# Patient Record
Sex: Female | Born: 1950 | Race: White | Hispanic: No | Marital: Married | Smoking: Former smoker
Health system: Southern US, Community
[De-identification: ages and names within clinical notes are randomized; demographics above are authoritative.]

## PROBLEM LIST (undated history)

## (undated) DIAGNOSIS — K449 Diaphragmatic hernia without obstruction or gangrene: Secondary | ICD-10-CM

## (undated) DIAGNOSIS — R208 Other disturbances of skin sensation: Secondary | ICD-10-CM

## (undated) DIAGNOSIS — F329 Major depressive disorder, single episode, unspecified: Secondary | ICD-10-CM

## (undated) DIAGNOSIS — K759 Inflammatory liver disease, unspecified: Secondary | ICD-10-CM

## (undated) DIAGNOSIS — F419 Anxiety disorder, unspecified: Secondary | ICD-10-CM

## (undated) DIAGNOSIS — S62102A Fracture of unspecified carpal bone, left wrist, initial encounter for closed fracture: Secondary | ICD-10-CM

## (undated) DIAGNOSIS — F32A Depression, unspecified: Secondary | ICD-10-CM

## (undated) DIAGNOSIS — H9319 Tinnitus, unspecified ear: Secondary | ICD-10-CM

## (undated) DIAGNOSIS — J189 Pneumonia, unspecified organism: Secondary | ICD-10-CM

## (undated) DIAGNOSIS — K219 Gastro-esophageal reflux disease without esophagitis: Secondary | ICD-10-CM

## (undated) DIAGNOSIS — IMO0001 Reserved for inherently not codable concepts without codable children: Secondary | ICD-10-CM

## (undated) DIAGNOSIS — S62102B Fracture of unspecified carpal bone, left wrist, initial encounter for open fracture: Secondary | ICD-10-CM

## (undated) DIAGNOSIS — Z8614 Personal history of Methicillin resistant Staphylococcus aureus infection: Secondary | ICD-10-CM

## (undated) DIAGNOSIS — M1990 Unspecified osteoarthritis, unspecified site: Secondary | ICD-10-CM

## (undated) DIAGNOSIS — S73004A Unspecified dislocation of right hip, initial encounter: Secondary | ICD-10-CM

## (undated) HISTORY — PX: KNEE SURGERY: SHX244

## (undated) HISTORY — PX: HIP SURGERY: SHX245

## (undated) HISTORY — PX: OTHER SURGICAL HISTORY: SHX169

## (undated) HISTORY — PX: JOINT REPLACEMENT: SHX530

## (undated) HISTORY — PX: BACK SURGERY: SHX140

---

## 1971-12-24 DIAGNOSIS — R208 Other disturbances of skin sensation: Secondary | ICD-10-CM

## 1971-12-24 HISTORY — DX: Other disturbances of skin sensation: R20.8

## 2006-02-24 ENCOUNTER — Inpatient Hospital Stay (HOSPITAL_COMMUNITY): Admission: RE | Admit: 2006-02-24 | Discharge: 2006-02-27 | Payer: Self-pay | Admitting: Orthopedic Surgery

## 2006-07-18 ENCOUNTER — Inpatient Hospital Stay (HOSPITAL_COMMUNITY): Admission: RE | Admit: 2006-07-18 | Discharge: 2006-07-20 | Payer: Self-pay | Admitting: Orthopedic Surgery

## 2008-01-06 ENCOUNTER — Inpatient Hospital Stay (HOSPITAL_COMMUNITY): Admission: RE | Admit: 2008-01-06 | Discharge: 2008-01-09 | Payer: Self-pay | Admitting: Orthopedic Surgery

## 2008-02-28 ENCOUNTER — Inpatient Hospital Stay (HOSPITAL_COMMUNITY): Admission: EM | Admit: 2008-02-28 | Discharge: 2008-03-03 | Payer: Self-pay | Admitting: Emergency Medicine

## 2008-06-17 ENCOUNTER — Ambulatory Visit (HOSPITAL_COMMUNITY): Admission: RE | Admit: 2008-06-17 | Discharge: 2008-06-18 | Payer: Self-pay | Admitting: Orthopedic Surgery

## 2008-09-22 ENCOUNTER — Inpatient Hospital Stay (HOSPITAL_COMMUNITY): Admission: RE | Admit: 2008-09-22 | Discharge: 2008-09-25 | Payer: Self-pay | Admitting: Orthopedic Surgery

## 2009-03-17 ENCOUNTER — Inpatient Hospital Stay (HOSPITAL_COMMUNITY): Admission: RE | Admit: 2009-03-17 | Discharge: 2009-03-20 | Payer: Self-pay | Admitting: Orthopedic Surgery

## 2009-03-29 ENCOUNTER — Emergency Department (HOSPITAL_COMMUNITY): Admission: EM | Admit: 2009-03-29 | Discharge: 2009-03-29 | Payer: Self-pay | Admitting: Emergency Medicine

## 2009-03-30 ENCOUNTER — Inpatient Hospital Stay (HOSPITAL_COMMUNITY): Admission: EM | Admit: 2009-03-30 | Discharge: 2009-04-07 | Payer: Self-pay | Admitting: Emergency Medicine

## 2009-05-06 ENCOUNTER — Emergency Department (HOSPITAL_COMMUNITY): Admission: EM | Admit: 2009-05-06 | Discharge: 2009-05-06 | Payer: Self-pay | Admitting: Emergency Medicine

## 2011-04-02 LAB — CBC
Hemoglobin: 12.1 g/dL (ref 12.0–15.0)
MCHC: 32.6 g/dL (ref 30.0–36.0)
MCV: 85.4 fL (ref 78.0–100.0)
RBC: 4.36 MIL/uL (ref 3.87–5.11)

## 2011-04-02 LAB — BASIC METABOLIC PANEL
BUN: 7 mg/dL (ref 6–23)
Calcium: 8.9 mg/dL (ref 8.4–10.5)
GFR calc non Af Amer: >60 mL/min (ref >60)
Glucose, Bld: 92 mg/dL (ref 70–99)

## 2011-04-02 LAB — URINALYSIS, ROUTINE W REFLEX MICROSCOPIC
Bilirubin Urine: NEGATIVE
Hgb urine dipstick: NEGATIVE
Specific Gravity, Urine: 1.012 (ref 1.005–1.030)
pH: 5.5 (ref 5.0–8.0)

## 2011-04-02 LAB — DIFFERENTIAL
Basophils Relative: 0 % (ref 0–1)
Eosinophils Absolute: 0.5 10*3/uL (ref 0.0–0.7)
Eosinophils Relative: 5 % (ref 0–5)
Monocytes Absolute: 0.6 10*3/uL (ref 0.1–1.0)
Monocytes Relative: 5 % (ref 3–12)

## 2011-04-03 LAB — BASIC METABOLIC PANEL
BUN: 7 mg/dL (ref 6–23)
BUN: 10 mg/dL (ref 6–23)
BUN: 9 mg/dL (ref 6–23)
CO2: 28 mEq/L (ref 19–32)
Calcium: 8 mg/dL — ABNORMAL LOW (ref 8.4–10.5)
Calcium: 8.3 mg/dL — ABNORMAL LOW (ref 8.4–10.5)
Calcium: 8.8 mg/dL (ref 8.4–10.5)
Chloride: 105 mEq/L (ref 96–112)
Chloride: 104 mEq/L (ref 96–112)
Chloride: 108 mEq/L (ref 96–112)
Chloride: 98 mEq/L (ref 96–112)
Creatinine, Ser: 0.52 mg/dL (ref 0.4–1.2)
Creatinine, Ser: 0.6 mg/dL (ref 0.4–1.2)
GFR calc Af Amer: >60 mL/min (ref >60)
GFR calc Af Amer: >60 mL/min (ref >60)
GFR calc Af Amer: >60 mL/min (ref >60)
GFR calc non Af Amer: >60 mL/min (ref >60)
GFR calc non Af Amer: >60 mL/min (ref >60)
GFR calc non Af Amer: >60 mL/min (ref >60)
GFR calc non Af Amer: >60 mL/min (ref >60)
GFR calc non Af Amer: >60 mL/min (ref >60)
Glucose, Bld: 101 mg/dL — ABNORMAL HIGH (ref 70–99)
Potassium: 3.9 mEq/L (ref 3.5–5.1)
Potassium: 4 mEq/L (ref 3.5–5.1)
Potassium: 4 mEq/L (ref 3.5–5.1)
Potassium: 4 mEq/L (ref 3.5–5.1)
Potassium: 4.1 mEq/L (ref 3.5–5.1)
Sodium: 137 mEq/L (ref 135–145)
Sodium: 137 mEq/L (ref 135–145)
Sodium: 139 mEq/L (ref 135–145)
Sodium: 139 mEq/L (ref 135–145)

## 2011-04-03 LAB — CBC
HCT: 29 % — ABNORMAL LOW (ref 36.0–46.0)
HCT: 32.9 % — ABNORMAL LOW (ref 36.0–46.0)
HCT: 33.2 % — ABNORMAL LOW (ref 36.0–46.0)
HCT: 34.8 % — ABNORMAL LOW (ref 36.0–46.0)
HCT: 36 % (ref 36.0–46.0)
Hemoglobin: 10.8 g/dL — ABNORMAL LOW (ref 12.0–15.0)
Hemoglobin: 11.1 g/dL — ABNORMAL LOW (ref 12.0–15.0)
Hemoglobin: 11.3 g/dL — ABNORMAL LOW (ref 12.0–15.0)
Hemoglobin: 9.8 g/dL — ABNORMAL LOW (ref 12.0–15.0)
MCHC: 33.1 g/dL (ref 30.0–36.0)
MCHC: 33.8 g/dL (ref 30.0–36.0)
MCV: 87.4 fL (ref 78.0–100.0)
MCV: 87 fL (ref 78.0–100.0)
MCV: 87.9 fL (ref 78.0–100.0)
MCV: 88.7 fL (ref 78.0–100.0)
MCV: 88.9 fL (ref 78.0–100.0)
Platelets: 482 10*3/uL — ABNORMAL HIGH (ref 150–400)
Platelets: 438 10*3/uL — ABNORMAL HIGH (ref 150–400)
Platelets: 471 10*3/uL — ABNORMAL HIGH (ref 150–400)
Platelets: 504 10*3/uL — ABNORMAL HIGH (ref 150–400)
RBC: 3.69 MIL/uL — ABNORMAL LOW (ref 3.87–5.11)
RBC: 3.74 MIL/uL — ABNORMAL LOW (ref 3.87–5.11)
RBC: 4.59 MIL/uL (ref 3.87–5.11)
RDW: 15 % (ref 11.5–15.5)
RDW: 15.3 % (ref 11.5–15.5)
RDW: 15.6 % — ABNORMAL HIGH (ref 11.5–15.5)
RDW: 15.6 % — ABNORMAL HIGH (ref 11.5–15.5)
WBC: 9.1 10*3/uL (ref 4.0–10.5)
WBC: 14 10*3/uL — ABNORMAL HIGH (ref 4.0–10.5)
WBC: 24.8 10*3/uL — ABNORMAL HIGH (ref 4.0–10.5)
WBC: 31.4 10*3/uL — ABNORMAL HIGH (ref 4.0–10.5)

## 2011-04-03 LAB — ANAEROBIC CULTURE

## 2011-04-03 LAB — WOUND CULTURE

## 2011-04-03 LAB — DIFFERENTIAL
Basophils Absolute: 0 10*3/uL (ref 0.0–0.1)
Eosinophils Relative: 0 % (ref 0–5)
Eosinophils Relative: 5 % (ref 0–5)
Lymphocytes Relative: 12 % (ref 12–46)
Lymphocytes Relative: 2 % — ABNORMAL LOW (ref 12–46)
Lymphs Abs: 1.7 10*3/uL (ref 0.7–4.0)
Monocytes Relative: 1 % — ABNORMAL LOW (ref 3–12)
Neutro Abs: 10.6 10*3/uL — ABNORMAL HIGH (ref 1.7–7.7)
Neutrophils Relative %: 76 % (ref 43–77)
Neutrophils Relative %: 97 % — ABNORMAL HIGH (ref 43–77)

## 2011-04-03 LAB — PROTIME-INR
INR: 1.1 (ref 0.00–1.49)
INR: 1.2 (ref 0.00–1.49)
INR: 1.5 (ref 0.00–1.49)
INR: 1.9 — ABNORMAL HIGH (ref 0.00–1.49)
INR: 2.1 — ABNORMAL HIGH (ref 0.00–1.49)
Prothrombin Time: 15.3 seconds — ABNORMAL HIGH (ref 11.6–15.2)
Prothrombin Time: 18.3 seconds — ABNORMAL HIGH (ref 11.6–15.2)
Prothrombin Time: 22.7 seconds — ABNORMAL HIGH (ref 11.6–15.2)

## 2011-04-03 LAB — GRAM STAIN

## 2011-04-04 LAB — TYPE AND SCREEN: Donor AG Type: NEGATIVE

## 2011-04-04 LAB — URINALYSIS, ROUTINE W REFLEX MICROSCOPIC
Glucose, UA: NEGATIVE mg/dL
Hgb urine dipstick: NEGATIVE
Protein, ur: NEGATIVE mg/dL
Specific Gravity, Urine: 1.015 (ref 1.005–1.030)

## 2011-04-04 LAB — COMPREHENSIVE METABOLIC PANEL
BUN: 14 mg/dL (ref 6–23)
CO2: 26 mEq/L (ref 19–32)
Chloride: 105 mEq/L (ref 96–112)
Creatinine, Ser: 0.59 mg/dL (ref 0.4–1.2)
GFR calc non Af Amer: >60 mL/min (ref >60)
Glucose, Bld: 92 mg/dL (ref 70–99)
Total Bilirubin: 0.6 mg/dL (ref 0.3–1.2)

## 2011-04-04 LAB — BASIC METABOLIC PANEL
BUN: 8 mg/dL (ref 6–23)
BUN: 9 mg/dL (ref 6–23)
CO2: 30 mEq/L (ref 19–32)
Calcium: 8.7 mg/dL (ref 8.4–10.5)
Calcium: 8.8 mg/dL (ref 8.4–10.5)
Chloride: 101 mEq/L (ref 96–112)
Chloride: 104 mEq/L (ref 96–112)
Creatinine, Ser: 0.46 mg/dL (ref 0.4–1.2)
GFR calc Af Amer: >60 mL/min (ref >60)
GFR calc Af Amer: >60 mL/min (ref >60)
Glucose, Bld: 93 mg/dL (ref 70–99)
Potassium: 3.8 mEq/L (ref 3.5–5.1)
Potassium: 4.4 mEq/L (ref 3.5–5.1)
Sodium: 134 mEq/L — ABNORMAL LOW (ref 135–145)
Sodium: 137 mEq/L (ref 135–145)

## 2011-04-04 LAB — CBC
HCT: 34.4 % — ABNORMAL LOW (ref 36.0–46.0)
HCT: 38.2 % (ref 36.0–46.0)
HCT: 42.3 % (ref 36.0–46.0)
Hemoglobin: 11.2 g/dL — ABNORMAL LOW (ref 12.0–15.0)
Hemoglobin: 12.4 g/dL (ref 12.0–15.0)
Hemoglobin: 13.9 g/dL (ref 12.0–15.0)
MCHC: 32.5 g/dL (ref 30.0–36.0)
MCV: 88.6 fL (ref 78.0–100.0)
MCV: 89.8 fL (ref 78.0–100.0)
MCV: 90 fL (ref 78.0–100.0)
Platelets: 287 10*3/uL (ref 150–400)
Platelets: 337 10*3/uL (ref 150–400)
RBC: 4.25 MIL/uL (ref 3.87–5.11)
RBC: 4.47 MIL/uL (ref 3.87–5.11)
RBC: 4.77 MIL/uL (ref 3.87–5.11)
RDW: 16.4 % — ABNORMAL HIGH (ref 11.5–15.5)
WBC: 10.6 10*3/uL — ABNORMAL HIGH (ref 4.0–10.5)
WBC: 8.6 10*3/uL (ref 4.0–10.5)
WBC: 9.1 10*3/uL (ref 4.0–10.5)

## 2011-04-04 LAB — DIFFERENTIAL
Basophils Absolute: 0 10*3/uL (ref 0.0–0.1)
Basophils Relative: 0 % (ref 0–1)
Eosinophils Absolute: 1 10*3/uL — ABNORMAL HIGH (ref 0.0–0.7)
Eosinophils Relative: 10 % — ABNORMAL HIGH (ref 0–5)
Monocytes Absolute: 0.6 10*3/uL (ref 0.1–1.0)
Monocytes Relative: 6 % (ref 3–12)

## 2011-04-04 LAB — APTT: aPTT: 33 seconds (ref 24–37)

## 2011-04-04 LAB — BRAIN NATRIURETIC PEPTIDE: Pro B Natriuretic peptide (BNP): 101 pg/mL — ABNORMAL HIGH (ref 0.0–100.0)

## 2011-04-04 LAB — PROTIME-INR
INR: 1 (ref 0.00–1.49)
INR: 1.1 (ref 0.00–1.49)
Prothrombin Time: 14.8 seconds (ref 11.6–15.2)

## 2011-05-07 NOTE — H&P (Signed)
[name], [name]                 ACCOUNT NO.:  [id number]   MEDICAL RECORD NO.:  [id number]          PATIENT TYPE:  INP   LOCATION:  1538                         FACILITY:  [facility]   PHYSICIAN:  Alvy Beal, MD    DATE OF BIRTH:  03/01/1951   DATE OF ADMISSION:  02/28/2008  DATE OF DISCHARGE:                              HISTORY & PHYSICAL   ADMISSION DIAGNOSIS:  Right periprosthetic femur fracture.   HISTORY:  This is a very pleasant 60 year old woman who presents with a  chief complaint of left thigh pain.  The patient states that last  evening at about 11 o'clock she slipped and twisted on a wet floor.  She  fell and then noted immediate pain on the right thigh.  She has had a  previous total hip by my partner Dr. Lequita Halt and when she presented x-  rays were done and they demonstrate a spiral distal femur fracture  (periprosthetic) as a result an ortho consultation was requested.   Past medical, surgical, family, and social history includes chronic back  pain status post multilevel lumbar spinal anterior-posterior fusion,  carpal tunnel release, hip replacement.  She is a nondrinker, non-drug  abuser.  She does smoke.   DRUG ALLERGIES:  AUGMENTIN.   CURRENT MEDICATIONS:  Percocet, Neurontin, Prozac, Xanax, clonazepam,  Prilosec and aspirin.   PHYSICAL EXAMINATION:  She is a pleasant woman who appears her stated  age in no acute distress. She is alert, oriented x3.  She has no  shortness of breath or chest pain.  Her abdomen is soft and nontender.  She has significant right thigh pain, moderate degree of swelling but it  is not tense to palpation.  The calf is soft and nontender.  She has  full range of motion at the ankle. Distal neurological exam is intact to  motor and sensory testing and intact dorsalis pedis posterior tibialis  pulses.  The left lower extremity is entirely asymptomatic.   X-rays demonstrate a spiral fracture of the distal femur starting at the  end  of the prostatic prosthesis and terminating just above the medial  femoral condyle.   At this point in time I have spoken with Dr. Lequita Halt and I have updated  him as to the situation.  The patient will be admitted, will keep her  n.p.o., place her in 10 pounds of Buck's traction and give her PCA for  pain control.  The plan will be to make her n.p.o. after midnight  tonight so that Dr. Lequita Halt can determine the best course of action for  surgical fixation.  I have explained this to the patient.      Alvy Beal, MD  Electronically Signed     DDB/MEDQ  D:  02/28/2008  T:  02/29/2008  Job:  [id number]

## 2011-05-07 NOTE — Discharge Summary (Signed)
[name], ESTOCK                 ACCOUNT NO.:  [id number]   MEDICAL RECORD NO.:  [id number]          PATIENT TYPE:  INP   LOCATION:  1611                         FACILITY:  [facility]   PHYSICIAN:  Ollen Gross, M.D.    DATE OF BIRTH:  Dec 07, 1951   DATE OF ADMISSION:  03/30/2009  DATE OF DISCHARGE:  04/07/2009                               DISCHARGE SUMMARY   ADMITTING DIAGNOSES:  1. Right hip infection.  2. Anxiety.  3. History of pneumonia with recent hospital stay.  4. Hiatal hernia.  5. Diverticulosis.  6. History of chronic hepatitis.  7. Degenerative disk disease.  8. Osteopenia.  9. Past history of acute respiratory distress syndrome following 13-      hour back surgery.  10.Postmenopausal.   DISCHARGE DIAGNOSES:  1. Infected hematoma right hip status post irrigation and debridement,      right hip.  2. Status post repeat irrigation and debridement, right hip.  3. Positive methicillin-resistant Staphylococcus aureus culture.  4. Postoperative acute blood loss anemia.  5. Mild hyponatremia preoperatively.  6. Mild hypokalemia preoperatively.  7. Anxiety.  8. History of pneumonia with recent hospital stay.  9. Hiatal hernia.  10.Diverticulosis.  11.History of chronic hepatitis.  12.Degenerative disk disease.  13.Osteopenia.  14.Past history of acute respiratory distress syndrome following 13-      hour back surgery.  15.Postmenopausal.   PROCEDURE:  1. The patient was taken to the OR on April 01, 2009 and underwent a      right hip irrigation and debridement.  Surgeon Dr. Lequita Halt, no      assistant, anesthesia general.  2. The patient was taken back to the OR on April 05, 2009 and      underwent a repeat right hip irrigation and debridement.  Surgeon      Dr. Lequita Halt, assistant Avel Peace, PA-C, anesthesia general.   CONSULTATIONS:  None.   BRIEF HISTORY:  The patient is a 60 year old female with a long complex  history in regards to her right hip, recent  acetabular liner 2 weeks ago  over to a constrained liner, postop course notable for probable  pneumonia.  She was discharged home on day 3 and did fine.  Two days  later she developed fever and increased right hip pain.  She was  admitted for right hip infection which following aspiration of the hip  showed purulent fluid.  Her INR was elevated and allowed to come down  before her procedure.   LABORATORY DATA:  Preop CBC showed a hemoglobin of 13.4, hematocrit of  40.0, white count was elevated 31.4, platelets 475.  Second CBC showed a  white count trending down to 24.8 and 14, got down to normal level of  8.4, last noted white count was 9.1.  CBCs were followed.  Hemoglobin  dropped down to 11.3 with some hydration and after the first surgery  hemoglobin was 10.8, went back up to 11.6, dropped down a little further  after the second surgery down to 9.8.  Last H and H was 10.4 and 30.8.  The differential on the  admission CBC showed elevated neutrophils of 97,  low lymphs of 2, monos 1, eosinophils of 0, basophils 0.  The follow-up  CBC with diff on April 10 410 showed neutrophils normalized at 76.  PT/INR on admission 22.7 and 1.9, allowed to drift down.  She was 1.2 on  April 01, 2009 before the second procedure.  Last noted PT/INR in the  hospital 14.8 and 1.1.  BMET on admission showed a low sodium of 130,  low potassium at 3.3.  Remaining BMET within normal limits.  Serial  BMETs were followed.  Sodium came up to 138, potassium came up to 4.09.  Remaining electrolytes remained within normal limits.  Vancomycin  troughs taken every other day on April 02, 2009, April 04, 2009 and  April 06, 2009, their numbers were respectively 5.6, 13.7, and 19.2.  Stat Gram stain taken from the hip aspiration smear showed few WBCs, no  organisms seen.  Wound culture taken from the hip aspiration smear  showed no organisms, but the culture grew out few methicillin-resistant  staph aureus.  Body fluid  culture taken on April 04, 2009 smear showed  no organisms, no growth.  Wound culture taken on April 01, 2009 smear  showed no organisms, no growth.  Anaerobic cultures taken on April 01, 2009 smear showed no organisms, no squamous epithelials, no anaerobes  isolated.   HOSPITAL COURSE:  The patient was admitted to [facility] ,  placed at bed rest with bathroom privileges, started on IV vancomycin.  She had elevated INR and the Coumadin was stopped and she was allowed to  drift back down.  Once her INR was normalized, she was taken to the  operating room on April 01, 2009, two days later, and underwent the  above-stated procedure without complication.  She had a noted white  count of over 31 on admission.  By hospital day #2 it was down to 24,  and the white count continued to improve.  She was given a little bit of  vitamin K to help her correction of the anticoagulation.  She was taken  to the OR on April 01, 2009 and underwent the above procedure.  Cultures  were repeated and continued on the IV vancomycin.  A PICC line was  ordered which was placed during the hospital course.  By April 02, 2009  she was postop day #1 and her white count was back down to near normal  at 10.8.  By postop day #2 she was doing a little bit better, but we had  determined that the original culture taken from the aspiration grew out  MRSA.  She was placed on contact precautions.  PICC line was placed and  she continued with the antibiotics.  By April 04, 2009 she was doing  better, however, due to the significant infection of MRSA it was felt  that she would benefit by undergoing a repeat washout.  She had a small  place that came up on the left hip and underwent a bedside procedure on  April 04, 2009 utilizing sterile technique and local anesthetic.  She  underwent an aspiration of fluid from the left hip that was sent for  Gram stain and culture.  Those were the cultures that were documented   from April 04, 2009.  That smear showed no organisms and no growth from  that opposite hip on the left side, but still had the MRSA infection on  the right hip.  She was  preop'd and taken back to the operating room on  the following day of April 05, 2009 and underwent repeat I and D and  washout due to the significant infection.  The following day, April 06, 2009, she continued to receive the IV vancomycin.  The Hemovac drain  came out a little bit later on that day after getting up with therapy.  The white count was back down to 10.  It was felt she would be going  home on the next day or so, so we got discharge planning to help arrange  home IV antibiotics via the PICC line on April 07, 2009.  She was doing  well, white count was normal at 9.1.  She was clinically doing better.  Hemoglobin was 10.4.  Lytes looked good.  Dressing changed, incision  looked good from the previous I and D.  She was receiving vancomycin and  discharged home.   DISCHARGE PLAN:  1. Patient discharged home on April 07, 2009.  2. Discharge diagnoses, please see above.  3. Discharge medications:  Vancomycin via PICC line for 4 weeks,      Robaxin, Percocet, Lovenox.  4. She is weightbearing as tolerated with activity, hip precautions.   FOLLOWUP:  She is to follow up Friday on April 14, 2009, call for an  appointment.   DISPOSITION:  Home.   CONDITION ON DISCHARGE:  Improved.      Alexzandrew L. Perkins, P.A.C.      Ollen Gross, M.D.  Electronically Signed    ALP/MEDQ  D:  04/21/2009  T:  04/21/2009  Job:  [id number]

## 2011-05-07 NOTE — Op Note (Signed)
NAMEGRISELDA, BRAMBLETT                 ACCOUNT NO.:  [id number]   MEDICAL RECORD NO.:  [id number]          PATIENT TYPE:  INP   LOCATION:  1611                         FACILITY:  [facility]   PHYSICIAN:  Ollen Gross, M.D.    DATE OF BIRTH:  1951-02-01   DATE OF PROCEDURE:  04/05/2009  DATE OF DISCHARGE:                               OPERATIVE REPORT   PREOPERATIVE DIAGNOSIS:  Infected right total hip.   POSTOPERATIVE DIAGNOSIS:  Infected right total hip.   PROCEDURE:  Right hip irrigation and debridement.   SURGEON:  Dr. Lequita Halt.   ASSISTANT:  Avel Peace, PA-C.   ANESTHESIA:  General.   ESTIMATED BLOOD LOSS:  Minimal.   DRAIN:  Hemovac x1.   COMPLICATIONS:  None.   CONDITION:  Stable to recovery.   BRIEF CLINICAL NOTE:  This is a 60 year old female with a complex  history in regards to right hip.  She developed an infected hematoma  approximately 2 weeks postop from revision hip.  She was taken to the  operating room 4 days ago and had irrigation and debridement.  Culture  showed MRSA.  She is here today for a second irrigation and debridement.   PROCEDURE IN DETAIL:  After the successful administration of general  anesthetic, the patient was placed in the left lateral decubitus  position with the right side up and held with the hip positioner.  The  Hemovac drain was easily removed and then the right lower extremity  isolated from her perineum with plastic drapes and prepped and draped in  the usual sterile fashion.  Staples were removed prior to prepping and  draping.  Once prepping and draping was complete, the previous incisions  were reutilized.  The skin cut with a 10 blade through subcutaneous  tissue to the level of the fascia lata which was incised in line with  the skin incision.  There was minimal fluid in the joint.  The joint  actually looks great.  The tissue all looks very clean.  I did not see  any purulence or signs of necrotic tissue.  The wound was then  thoroughly irrigated with 6 liters of saline solution using pulsatile  lavage.  At the completion of this, the tissues remained healthy  appearing.  I placed a Hemovac drain deep to the fascia lata, then  closed the fascia lata with interrupted #1 Vicryl.  The second limb of  the Hemovac drain was placed in the subcu tissue and the subcu  closed with interrupted 2-0 Vicryl.  Both drains were tested and were  freely moving.  There were hooked up to suction.  Staples were placed  and the incision cleaned and dried.  A bulky sterile dressing applied.  She was then awakened and transported to recovery in stable condition.      Ollen Gross, M.D.  Electronically Signed     FA/MEDQ  D:  04/05/2009  T:  04/05/2009  Job:  [id number]

## 2011-05-07 NOTE — Op Note (Signed)
NAMEALONI, CHUANG                 ACCOUNT NO.:  [id number]   MEDICAL RECORD NO.:  [id number]          PATIENT TYPE:  INP   LOCATION:  1538                         FACILITY:  [facility]   PHYSICIAN:  Ollen Gross, M.D.    DATE OF BIRTH:  11-Jul-1951   DATE OF PROCEDURE:  02/29/2008  DATE OF DISCHARGE:                               OPERATIVE REPORT   PREOPERATIVE DIAGNOSIS:  Right periprosthetic femur fracture.   POSTOPERATIVE DIAGNOSIS:  Right periprosthetic femur fracture.   PROCEDURE:  Open reduction internal fixation right periprosthetic femur  fracture with femoral component revision.   SURGEON:  Dr. Homero Fellers Aluisio.   ASSISTANT:  Avel Peace, PA-C.   ANESTHESIA:  General.   ESTIMATED BLOOD LOSS:  800.   DRAINS:  Hemovac x1.   COMPLICATIONS:  None.   CONDITION:  Stable to recovery.   CLINICAL NOTE:  Bryndle is a 60 year old female who had an uncomplicated  right total hip arthroplasty done approximately 2 months ago. She was  doing extremely well and went to the beach this past weekend.  She was  in her laundry room and slipped and fell, twisting her leg with  immediate pain and deformity.  Radiographs showed a type 3  periprosthetic femur fracture.  She presents now for open reduction  internal fixation and femoral component revision to a longer stem.   PROCEDURE IN DETAIL:  After the successful initiation of general  anesthetic, the patient is placed in the left lateral decubitus position  with the right side up and held with a hip positioner.  The right lower  extremity is isolated from her perineum with plastic drapes and prepped  and draped in the usual sterile fashion.  A long posterolateral incision  is made starting just above the greater trochanter coursing all the way  down to the metaphyseal flare of the distal femur.  The skin is cut with  a 10 blade through the subcutaneous to the tissue to the fascia lata  which is incised in line with the skin incision.   Meticulous hemostasis  is achieved throughout the dissection.  The fascia of the vastus  lateralis is incised and muscle is elevated to reveal the femur.  It was  a spiral fracture running at least half the length of the femur.  We  applied traction to the leg and anatomically reduced it and held it with  fracture reduction clamps.  I placed two Zimmer cables around the femur  to hold it in place while I applied the plate.  The cables were placed  and tightened.  This kept the fracture reduced. A six-hole Zimmer cable  plate is then placed on the lateral cortex of the femur and clamped to  the femur.  The two distal holes were filled with cortical screws 46 and  48 mm in length with excellent purchase.  I did not fully tightened them  until I placed two cables proximally and once I fully tightened the  cables then I fully tightened the screws.  We had excellent reduction of  the fracture.  Two more cables are  placed for a total of 6 cables, two  around the bone itself, a fourth through the plate. I feel very secure  with the fixation over this fracture. We then returned proximally and  isolated the pseudocapsule posteriorly off the femur.  This revealed the  hip joint.  I carefully dislocated the hip and removed the femoral head.  She had an S-ROM 20 x 15 stem with a 36 plus 8 neck.  We used the  extraction tool to dislodge the bond between the stem and the sleeve.  I  then extracted the stem easily.  Per measurement intraop and throughout  templating, the appropriate length for the revision stem is a 20 x 15,  36 plus 8 extra long which is 270 mm in length. This would get [place] just  to the level of the proximal screw that we placed through the plate.  We  then placed the guidewire through the femoral sleeve. The sleeve was  well fixed. The guidewire was passed into the femoral canal and then  reamed up to 16.5 mm which is 1.5 mm above the internal diameter of the  stem.  The 20 x 15, 36  plus 8 right extra long stem is then impacted  into the femur, matching native anteversion.  Once it is fully impacted  then we placed the femoral head which was the 36 plus 0 and then the hip  is reduced with excellent stability throughout.  I then went back and  inspected the fracture site distally and it did not shift at all.  I  palpated around the femur and visualized around the femur and the stem  remained intramedullary not coming through the femur.  I placed it  through a range of motion and even with rotation the construct is very  stable.  The wound is then copiously irrigated with saline solution and  the fascia of the vastus lateralis closed with running #1 Vicryl.  The  fascia lata was closed with #1 Vicryl over a Hemovac drain, subcu closed  with #1 and #2-0 Vicryl and skin with staples.  Prior to closing  proximally, I did reattach the pseudocapsule posteriorly to the femur  with Ethibond suture.  Once the skin is closed and we hooked up the  drain and the incisions is cleaned and dried and bulky sterile dressing  applied. She is placed into a knee immobilizer, awakened and transported  to recovery in stable condition.      Ollen Gross, M.D.  Electronically Signed     FA/MEDQ  D:  02/29/2008  T:  03/01/2008  Job:  [id number]

## 2011-05-07 NOTE — Op Note (Signed)
NAMESHIGEKO, MANARD                 ACCOUNT NO.:  [id number]   MEDICAL RECORD NO.:  [id number]          PATIENT TYPE:  AMB   LOCATION:  DAY                          FACILITY:  [facility]   PHYSICIAN:  Ollen Gross, M.D.    DATE OF BIRTH:  04/20/1951   DATE OF PROCEDURE:  06/17/2008  DATE OF DISCHARGE:                               OPERATIVE REPORT   PREOPERATIVE DIAGNOSIS:  Delayed union right femur fracture.   POSTOPERATIVE DIAGNOSIS:  Delayed union right femur fracture.   PROCEDURE:  Hardware revision right femur fracture.   SURGEON:  Shella Spearing, M.D.   ASSISTANT:  Patrica Duel, PA-C   ANESTHESIA:  General.   ESTIMATED BLOOD LOSS:  Minimal.   DRAIN:  None.   COMPLICATIONS:  None.   CONDITION:  Stable to recovery.   BRIEF CLINICAL NOTE:  Caoilainn is a 60 year old female with complex history  in regards to her right hip.  She had an uncomplicated primary total hip  arthroplasty earlier this year, and several weeks postoperatively had a  fall and fractured well below the stem.  She had a revision to a long  stem with a side plate with cables and screws.  Unfortunately, she has  had delayed healing and is now starting to bow in a varus direction, and  the screws are separating from the plate.  She presents now for hardware  revision with hopeful maintenance of the plate and addition of new  screws and cables to stabilize this fracture.   PROCEDURE IN DETAIL:  After successful administration of general  anesthetic, the patient is placed in the left lateral decubitus position  with the right side up and held with the hip positioner.  Right lower  extremity was isolated from her perineum with plastic drapes and prepped  and draped in usual sterile fashion.  I used the distal one-third of her  previous incision.  The skin was cut with a #10 blade through  subcutaneous tissue to the fascia lata which was incised in line with  the skin incision.  Fascia of the vastus  lateralis is also elevated.  I  dissected through scar to get down to the plate.  The distal-most screw  is backed away out of the plate and removed.  No evidence of any  abnormal-appearing fluid around the area.  I was unable to reduce the  distal femur back to the plate.  I removed that distal-most screw and  then the other screw right more proximal to it.  The more proximal had  been broken.  I redirected the angle and had more proximal screw  drilling through both cortices and then placed a 60 mm cortical screw  with outstanding purchase, which kept the fracture reduced.  Then placed  a 6.5 cancellous screw through the distal-most hole after drilling and  also had excellent purchase with that.  I then placed two additional  cables through the Zimmer plate, around the femur, and then tightened  and crimped he cables.  We cut the cables, and I placed her knee through  a range of motion  and hip through a range of motion, and it was found  that the construct was stable and that there was no movement at the  fracture site.  There was already abundant callus that had formed.  Thus, I did not place any additional bone graft.  I feel with proper  stability this fracture will now heal.  The wound was then copiously  irrigated with saline solution, and deep tissue closed with running #2  quill, subcutaneous closed with interrupted 2-0 Vicryl, and skin with  staples.  Incision was cleaned and dried, and a bulky sterile dressing  applied.  She is placed into a knee immobilizer, awakened, and  transported to recovery in stable condition.      Ollen Gross, M.D.  Electronically Signed     FA/MEDQ  D:  06/17/2008  T:  06/17/2008  Job:  [id number]

## 2011-05-07 NOTE — Op Note (Signed)
[name], [name]                 ACCOUNT NO.:  [id number]   MEDICAL RECORD NO.:  [id number]          PATIENT TYPE:  INP   LOCATION:  NA                           FACILITY:  [facility]   PHYSICIAN:  Ollen Gross, M.D.    DATE OF BIRTH:  [birth year]   DATE OF PROCEDURE:  01/06/2008  DATE OF DISCHARGE:                               OPERATIVE REPORT   PREOPERATIVE DIAGNOSIS:  Avascular necrosis, right hip.   POSTOPERATIVE DIAGNOSIS:  Avascular necrosis, right hip.   PROCEDURE:  Right total hip arthroplasty.   SURGEON:  Ollen Gross, M.D.   ASSISTANT:  Avel Peace PA-C   ANESTHESIA:  General.   BLOOD LOSS:  150 mL.   DRAINS:  None.   COMPLICATIONS:  None.   CONDITION:  Stable to recovery.   BRIEF CLINICAL NOTE:  [name] is a 60 year old female with osteonecrosis  of the right hip with a rapid progression and collapse of the femoral  head and severe symptoms.  She has been essentially bedridden for the  past week.  She presents now for right total hip arthroplasty.   PROCEDURE IN DETAIL:  After the successful administration of general  anesthetic, the patient was placed in left lateral decubitus position  with the right side up and held with the hip positioner.  The right  lower extremity isolated from her perineum with plastic drapes and  prepped and draped in the usual sterile fashion.  Short posterolateral  incision was made with a 10 blade through subcutaneous tissue to the  level of the fascia lata which was incised in line with the skin  incision.  The sciatic nerve was palpated and protected and the short  rotators isolated off the femur.  Capsulectomy is performed.  The  femoral head is significantly eroded.  There were no signs of any  infectious type fluid.  The hip was dislocated and the center of the  remaining head marked.  Trial prosthesis placed such that the center of  the trial head corresponds to the center of the native femoral head.  Osteotomy lines marked  on the femoral neck and osteotomy made with an  oscillating saw.  The head is removed and the femur retracted anteriorly  to gain acetabular exposure.   Acetabular retractors were placed and labrum and osteophytes removed.  There was a lot of inflammatory tissue which is also excised.  The bone  quality the acetabulum looks healthy.  I began reaming at 43 mm coursing  in increments of 2 up to 49 mm and then a 50 mm pinnacle acetabular  shell was placed in anatomic position and transfixed with two dome  screws.  A permanent 36 mm neutral Ultamet metal liner was placed.   The femur was prepared with the canal finder and irrigation.  Axial  reaming is performed to 15.5 mm, proximal reaming to a 20D and the  sleeve machined to a large.  20 D large trial sleeve is placed with 20 x  15 stem and 36 standard neck matching native anteversion.  The trial  36.0 head is placed.  I reduced it and there was not enough offset.  I  went to 36 +8 neck matching native anteversion with a 36 +0 head.  The  offset was perfect.  The hip was reduced with great stability.  She had  full extension, full external rotation 70 degrees flexion, 40 degrees  adduction, 90 degrees internal rotation and 90 degrees of flexion and 70  degrees of internal rotation.  By placing the right leg on top of the  left leg lengths are equal.  The hip was then dislocated and trials  removed.  The permanent 20 D large sleeve is placed with 20 x 15 stem  and 36 +8 neck matching native anteversion.  The 36 +0 head is placed  and the hip was reduced same stability parameters.  The wounds copiously  irrigated with saline solution and short rotators reattached to the  femur through drill holes.  Fascia lata was closed with interrupted 1-0  Vicryl, subcu closed with #1-0 and #2-0 Vicryl and subcuticular running  4-0 Monocryl.  The incision is cleaned and dried and Steri-Strips and a  bulky sterile dressing applied.  She is placed into a  knee immobilizer,  awakened and transferred to recovery in stable condition.      Ollen Gross, M.D.  Electronically Signed     FA/MEDQ  D:  01/06/2008  T:  01/07/2008  Job:  [id number]

## 2011-05-07 NOTE — Op Note (Signed)
[name], [name]                 ACCOUNT NO.:  [id number]   MEDICAL RECORD NO.:  [id number]          PATIENT TYPE:  INP   LOCATION:  1611                         FACILITY:  [facility]   PHYSICIAN:  Ollen Gross, M.D.    DATE OF BIRTH:  1951/07/04   DATE OF PROCEDURE:  04/01/2009  DATE OF DISCHARGE:                               OPERATIVE REPORT   PREOPERATIVE DIAGNOSIS:  Infected hematoma right hip.   POSTOPERATIVE DIAGNOSIS:  Infected hematoma right hip.   PROCEDURE:  Right hip irrigation and debridement.   SURGEON:  Ollen Gross, M.D.   ASSISTANT:  No assistant.   ANESTHESIA:  General.   ESTIMATED BLOOD LOSS:  Minimal.   DRAINS:  Hemovac times one.   COMPLICATIONS:  None.   CONDITION:  Stable to recovery room.   BRIEF CLINICAL NOTE:  [name] is a 60 year old female with a long complex  history in regards to her right hip.  She had a revision acetabular  liner 2 weeks ago to a constrained liner.  Postop course notable for a  probable pneumonia.  She was discharged home on postop day #3 and did  fine.  Two days ago she developed fever and increased right hip pain.  She was admitted on April 8 and had a hip aspiration showing purulent  fluid.  Her INR was too elevated for surgery at that point and she is  brought to the operating room today for irrigation and debridement.   PROCEDURE IN DETAIL:  After the successful administration of general  anesthetic the patient was placed in left lateral decubitus position  with the right side up and held with the hip positioner.  Right lower  extremity was isolated from the perineum with plastic drapes and prepped  and draped in the usual sterile fashion.  Previous posterolateral  incisions were utilized and skin cut with 10 blade through subcutaneous  tissue.  There was a fair amount of purulent fluid in the subcu tissue.  It was sent for Gram stain, C and S, aerobic and anaerobic cultures.  I  thoroughly suctioned out this fluid and  then noted there was a  communication with the deep tissues.  I incised the fascia lata removing  all the previous sutures.  The tissue quality looked fine with no  evidence of any necrotic tissue.  We thoroughly irrigated with 3 liters  of saline.  I then debrided the superficial layers of the tissue down to  healthy bleeding tissue.  I irrigated with another 3 liters using  pulsatile lavage.  We then closed the fascia lata over a Hemovac drain  with  interrupted #1 PDS.  Subcu tissues were debrided down to healthy  appearing tissue.  The subcu was then closed with interrupted 2-0 Vicryl  and skin with staples.  Bulky sterile dressing was applied and drains  hooked to suction.  She was then awakened and transported to recovery in  stable condition.      Ollen Gross, M.D.  Electronically Signed     FA/MEDQ  D:  04/01/2009  T:  04/01/2009  Job:  [id number] 

## 2011-05-07 NOTE — H&P (Signed)
[name], KINDIG                 ACCOUNT NO.:  [id number]   MEDICAL RECORD NO.:  [id number]          PATIENT TYPE:  INP   LOCATION:  NA                           FACILITY:  [facility]   PHYSICIAN:  Ollen Gross, M.D.    DATE OF BIRTH:  05/21/1951   DATE OF ADMISSION:  DATE OF DISCHARGE:                              HISTORY & PHYSICAL   CHIEF COMPLAINT:  Unstable right total hip.   HISTORY OF PRESENT ILLNESS:  The patient 60 year old female who is well-  known Dr. Homero Fellers Aluisio.  Unfortunately, has sustained 3 dislocations  concerning her right total hip, most recently while she was on vacation.  She has been seen by Dr. Lequita Halt in followup and felt that she would  benefit undergoing a constrained liner.  Risks and benefits have been  discussed.  She elects to proceed with surgery.   ALLERGIES:  No known drug allergies, intolerance, caused sickness.   CURRENT MEDICATIONS:  Neurontin, Prozac, Xanax, clonazepam, Prilosec,  aspirin, Percocet, Boniva, Arthrotec, bone strength supplement,  multivitamin, vitamin C.   PAST MEDICAL HISTORY:  Anxiety, history of pneumonia, hiatal hernia,  diverticulosis, history of chronic hepatitis, degenerative disk disease,  osteopenia, past history of ARDS (acute respiratory distress syndrome)  following 13-hour back surgery, postmenopausal.   PAST SURGICAL HISTORY:  Carpal tunnel surgery.  Six back surgeries with  multiple fusions.  Knee surgery x2.  Throat cyst excision 2004.  Arterial surgery of her arm.  She has undergone a left total hip in  2007, right total hip in January 2009, has undergone a left acetabular  revision February 2007, revision total hip with conversion to  constrained liner on the left.  She has undergone ORIF of periprosthetic  fracture with revision of the ORIF of periprosthetic fracture in the  third surgery on the periprosthetic fracture.   SOCIAL HISTORY:  Married.  Social intake of alcohol.  One child.  Husband will be  assisting with care after surgery.  Does smoke half pack  per day.   FAMILY HISTORY:  Father with heart disease, stroke and cancer.   REVIEW OF SYSTEMS:  GENERAL:  No fevers, chills, night sweats.  NEURO:  No syncope, seizures or paralysis.  RESPIRATORY:  No shortness breath,  productive cough or hemoptysis.  CARDIOVASCULAR:  No chest pain, angina,  orthopnea.  GI:  No nausea, vomiting, diarrhea, or constipation.  GU:  No dysuria, hematuria, or discharge.  MUSCULOSKELETAL:  Right hip.   PHYSICAL EXAMINATION:  VITAL SIGNS:  Pulse 74, respirations 12, blood  pressure 118/78.  GENERAL:  A 60 year old white female, small petite frame, alert and  cooperative, pleasant.  Good historian.  HEENT:  Normocephalic, atraumatic.  Pupils are equal, round, reactive.  EOMs intact.  NECK:  Supple.  CHEST:  Clear.  HEART:  Regular rate and rhythm with a grade 2/6 systolic ejection  murmur.  ABDOMEN:  Soft, flat, nontender.  Bowel sounds present.  RECTAL, BREASTS, GENITALIA:  Not done, not pertinent to present illness.  EXTREMITIES:  Right hip flexion 120, internal rotation 20, external  rotation 30, abduction 30.  IMPRESSION:  Unstable right total hip.   PLAN:  The patient admitted to [facility] to undergo right  acetabular revision constrained liner.  Surgery will be performed by  Ollen Gross.      Alexzandrew L. Perkins, P.A.C.      Ollen Gross, M.D.  Electronically Signed    ALP/MEDQ  D:  03/16/2009  T:  03/16/2009  Job:  [id number]   cc:   Ollen Gross, M.D.  Fax: [phone]

## 2011-05-07 NOTE — H&P (Signed)
[name], [name]                 ACCOUNT NO.:  [id number]   MEDICAL RECORD NO.:  [id number]          PATIENT TYPE:  INP   LOCATION:  1620                         FACILITY:  [facility]   PHYSICIAN:  Ollen Gross, M.D.    DATE OF BIRTH:  [birth year]   DATE OF ADMISSION:  01/06/2008  DATE OF DISCHARGE:                              HISTORY & PHYSICAL   CHIEF COMPLAINT:  Right hip pain.   HISTORY OF PRESENT ILLNESS:  The patient is a 60 year old female well-  known to Dr. Homero Fellers Aluisio.  She had a previous constrained liner  revision, left hip, back in July 2007, doing well with the left hip but  continues to have right hip pain secondary to AVN.  Unfortunately, over  the past several months she has progressed a great deal with severe pain  and severe collapse of the femoral head.  She has reached a point where  she is ready to have her hip done.  Risks and benefits discussed.  The  patient has elected to be in the hospital.   ALLERGIES:  No known drug allergies.  Intolerances:  Augmentin.   CURRENT MEDICATIONS:  Do not have the list.  The patient will bring the  list with her to the hospital.   PAST MEDICAL HISTORY:  1. Anxiety.  2. History of pneumonia.  3. Hiatal hernia.  4. Diverticulosis.  5. History of chronic hepatitis in remission.  6. Degenerative disk disease.  7. Osteoporosis.  8. History of ARDS (acute respiratory distress syndrome) following a      13-hour back surgery.  9. Postmenopausal.   PAST SURGICAL HISTORY:  1. Carpal tunnel syndrome.  2. Six back surgeries with multiple fusions.  3. Knee surgery x2.  4. Throat cyst excision, 2004.  5. Arterial surgery in her arm.  6. Left acetabular revision February 2007.  7. Revision of left total hip with conversion to a constrained liner      in 2007.   SOCIAL HISTORY:  Married.  Social intake of alcohol.  One child.  Husband will be assisting with care after surgery.   FAMILY HISTORY:  Father deceased with history  of heart disease, stroke,  cancer.   REVIEW OF SYSTEMS:  GENERAL:  No fevers, chills, night sweats.  NEURO:  No seizures, syncope or paralysis.  RESPIRATORY:  No productive cough or  hemoptysis.  CARDIOVASCULAR:  No chest pain, angina or orthopnea.  GI:  No nausea, vomiting, diarrhea or constipation.  GU:  No dysuria,  hematuria or discharge.  MUSCULOSKELETAL:  Right hip.   PHYSICAL EXAM:  VITAL SIGNS:  Temperature 97.8, pulse 99, respiration  18, blood pressure 100.53.  GENERAL:  A 60 year old white female, petite, thin frame, no acute  distress.  Mild distress secondary to her right hip pain.  She is alert,  oriented and cooperative, excellent historian.  HEENT:  Normocephalic, atraumatic.  Pupils equal, round and reactive.  Oropharynx clear.  NECK:  Supple.  CHEST:  Clear anterior and posterior chest walls.  HEART:  Regular rate and rhythm, a systolic ejection murmur.  ABDOMEN:  Soft, nontender.  Bowel sounds present.  RECTAL, BREASTS, GENITALIA:  Not done, not pertinent to present illness.  EXTREMITIES:  Right hip flexion about 95 degrees, has very minimal  internal and external rotation with pain on passive range of motion.   IMPRESSION:  1. Avascular necrosis with collapsed femoral head, right hip.  2. Anxiety.  3. History of acute respiratory distress syndrome following a 13-hour      back surgery in the past.  4. History of pneumonia.  5. Hiatal hernia.  6. History of diverticulosis.  7. History of chronic hepatitis in remission.  8. Osteoporosis.  9. Degenerative disk disease.  10.Postmenopausal.   PLAN:  Patient admitted to [facility] to undergo a right total  hip arthroplasty.  Surgery will be performed by Dr. Ollen Gross.      Alexzandrew L. Perkins, P.A.C.      Ollen Gross, M.D.  Electronically Signed    ALP/MEDQ  D:  01/07/2008  T:  01/07/2008  Job:  [id number]

## 2011-05-07 NOTE — Op Note (Signed)
NAMEBLESSYN, SOMMERVILLE                 ACCOUNT NO.:  [id number]   MEDICAL RECORD NO.:  [id number]          PATIENT TYPE:  INP   LOCATION:  1608                         FACILITY:  [facility]   PHYSICIAN:  Ollen Gross, M.D.    DATE OF BIRTH:  November 29, 1951   DATE OF PROCEDURE:  09/22/2008  DATE OF DISCHARGE:                               OPERATIVE REPORT   PREOPERATIVE DIAGNOSIS:  Nonunion, right periprosthetic femur fracture.   POSTOPERATIVE DIAGNOSIS:  Nonunion, right periprosthetic femur fracture.   PROCEDURE:  1. Open reduction internal fixation, right periprosthetic femur      fracture.  2. Right femoral component revision.   SURGEON:  Ollen Gross, M.D.   ASSISTANT:  Avel Peace. P.A.-C.   ANESTHESIA:  General.   ESTIMATED BLOOD LOSS:  40.   DRAINS:  Hemovac x1.   COMPLICATIONS:  None.   CONDITION:  Stable to recovery.   BRIEF CLINICAL NOTE:  Lakeyia is a 60 year old female with complex history  with regards to her right hip.  Had a primary total hip arthroplasty in  January and did very well initially, then fell a few weeks later, had a  periprosthetic femur fracture distal to the stem.  Was treated with a  long stem and side plate.  Went on to have a delayed union.  I attempted  to fix this with the revising the side plate, but unfortunately, she has  gone on to a nonunion with varus angulation.  She presents now for  revision surgery with revision to a longer stem for intramedullary  fixation and conversion to strut grafting for extramedullary fixation.   PROCEDURE IN DETAIL:  After successful initiation of general anesthetic,  the patient was placed in left lateral decubitus position with the right  side up and held with the hip positioner.  Right lower extremity was  isolated from the perineum with plastic drapes and prepped and draped in  usual sterile fashion.  Distally, I opened the skin first with a 10  blade, through subcutaneous tissue to the level of fascia lata  which was  incised in line with the skin incision.  The scar tissue over the vastus  lateralis was incised and incised all the way down to the femur.  I  identified the plate, and it was essentially pulled off the bone, and  the distal fragment was unstable.  I removed the distal-most screw and  the broken part of the proximal screw.  We then cut the 3 other cables  and removed the cables and the plate.  There were 2 other cables around  the femur which were already frayed and I cut those and removed those  also.  There was minimal motion at the fracture site.  It was only  ununited laterally and somewhat anteriorly.  Medially and posteriorly,  there was some bony union.  I took down the fibrous tissue from the  fracture site.  I was able to get some bone from around the plate, and I  morselized that and then later packed into the fracture site.  I then  configured a  femoral strut graft to fit on the lateral cortex of the  femur.  The cables were then passed, 1 proximal and 1 distal to the  fracture.   We then made the incision proximally, cut the skin with a 10 blade  through subcutaneous tissue to the fascia lata which was incised in line  with the skin incision.  The pseudocapsule was excised off of the  posterior femur to reveal the hip joint.  We carefully dislocated the  hip and then took off the femoral head.  The screwdriver for the SROM  was then placed to disrupt the interface between the stem and the  sleeve.  I then used the extraction device and was able to remove the  long stem.  This was a 20 x 15 stem with 36 plus 8 neck.  This was an  extra long length of 270 mm.  The stem was removed intact.  Per  preoperative templating, we were going to go with an extra, extra long  stem which is 325 mm in length and would provide fixation well beyond  the fracture site.  The stem was then impacted.  We got across the  fracture site, but then the stem would go no further.  I then  inspected  at the fracture site, and there was a retained screw in the canal.  I  was able to use a rongeur to bite around the screw and then also used a  rongeur to remove the screw.  We then cleaned out the canal and  thoroughly irrigated and able to impact the stem until it was locked  into position.  The femoral head was then placed back onto the neck and  the hips reduced.  The whole femur was moving as a solitary unit and was  very stable.  We took an x-ray in AP and lateral plane and showed  outstanding alignment, AP and lateral, with very solid-appearing  fixation.  There were no fractures at the tip of the stem.  We  thoroughly irrigated the neck, and I packed the morselized bone graft  into the fracture site to serve as a graft.  I then put the strut over  the lateral cortex of the femur and tightened down the cables.  I only  put 1 cable proximal and 1 distal because I felt it was only safe places  to pass as the rest of the area where  could have passed was encased in  callus and scar, and I was concerned about entrapping neurovascular  structures if I went through those areas.  We tightened down the cables  and had very solid fixation of the strut graft.  The wounds were then  copiously irrigated with saline solution and the deep tissue closed with  running #1 Vicryl on the distal incision.  Fascia lata closed with a  running #1 Vicryl and subcutaneous tissue with interrupted 2-0 Vicryl.  A Hemovac drain had been placed down deep and exited out distally.  On  the proximal incision, I closed the fascia lata with interrupted #1  Vicryl, subcutaneous #1-0 and #2-0 Vicryl, and skin with staples.  On  the distal incision, subcutaneous was closed with 2-0 Vicryl and skin  with staples.  Drain was hooked to suction.  Incision cleaned and dried.  Was placed into a bulky sterile dressing and knee immobilizer.  She was  then awakened and transported to recovery in stable  condition.      Homero Fellers Aluisio,  M.D.  Electronically Signed     FA/MEDQ  D:  09/22/2008  T:  09/23/2008  Job:  [id number]

## 2011-05-07 NOTE — Op Note (Signed)
NAMESINAYA, MINOGUE                 ACCOUNT NO.:  [id number]   MEDICAL RECORD NO.:  [id number]          PATIENT TYPE:  INP   LOCATION:  0004                         [facility]ACILITY[facility]  [facility]   PHYSICIAN:  Ollen Gross, M.D.    DATE OF BIRTH:  1951/06/30   DATE OF PROCEDURE:  03/17/2009  DATE OF DISCHARGE:                               OPERATIVE REPORT   PREOPERATIVE DIAGNOSIS:  Unstable right total hip arthroplasty.   POSTOPERATIVE DIAGNOSIS:  Unstable right total hip arthroplasty.   PROCEDURE:  1. Right acetabular revision with constrained liner.   SURGEON:  Dr. Lequita Halt.   ASSISTANT:  Avel Peace, PA-C.   ANESTHESIA:  General.   ESTIMATED BLOOD LOSS:  Less than 100.   DRAIN:  None.   COMPLICATIONS:  None.   CONDITION:  Stable to recovery room.   BRIEF CLINICAL NOTE:  [name] is a 60 year old female who has had a long  complex history in regards to her right hip.  She recently was away on  vacation and had, had two episodes of dislocation.  She has had similar  issues in the past in the left hip.  That was fixed by placement of a  constrained system.  She had requested that we do the same for the right  hip.  She presents now for conversion to a constrained liner in the  acetabulum.   PROCEDURE IN DETAIL:  After the successful initiation of general  anesthetic, the patient was placed in left lateral decubitus position  with the right side up and held with the hip positioner.  The right  lower extremity was isolated from her perineum with plastic drapes and  prepped and draped in the usual sterile fashion.  A short posterolateral  incision was utilized.  The skin cut with the 10 blade through  subcutaneous tissue to the level of fascia lata, which was incised in  line with the skin incision.  The sciatic nerve was palpated and  protected and the posterior pseudocapsule was already off the femur from  the dislocation.  I dislocated the hip which was rather difficult to get  out.   We had to flex her to 90, rotate in about 70 and adduct about 40  to get her out.  I removed the femoral head and we retracted the femur  anteriorly to gain acetabular exposure.   Once adequate acetabular exposure was obtained, I removed the liner from  the 50 mm pinnacle acetabular shell.  I then cleared off the soft tissue  around the shell and impacted in the 50 mm pinnacle constrained liner.  It was circumferentially impacted and I tested it to make sure there was  stable.  It is found to be stable.  We then did a 28 +3 head and put it  on the femoral neck and reduced the hip.  There was great stability with  full extension, full external rotation, 70 degrees of flexion, 40  degrees adduction, 90 degrees internal rotation, 90 degrees of flexion  and 70 degrees of internal rotation.  We then placed the locking ring  and  once that was impacted and place through she had the same range of  motion.  The wound was then copiously irrigated with saline solution and  some of the posterior pseudocapsule reattached to the femur through  drill holes.  The fascia lata closed with interrupted #1 Vicryl over a  Hemovac drain.  The subcu was closed with interrupted 2-0 Vicryl and  skin closed with staples.  The incision was then cleaned and dried and a  bulky sterile dressing applied.  She was then awakened and transferred  to recovery in stable condition.      Ollen Gross, M.D.  Electronically Signed     FA/MEDQ  D:  03/17/2009  T:  03/17/2009  Job:  [id number]

## 2011-05-07 NOTE — Discharge Summary (Signed)
[name], [name]                 ACCOUNT NO.:  [id number]   MEDICAL RECORD NO.:  [id number]          PATIENT TYPE:  INP   LOCATION:  1620                         FACILITY:  [facility]   PHYSICIAN:  Ollen Gross, M.D.    DATE OF BIRTH:  [birth year]   DATE OF ADMISSION:  01/06/2008  DATE OF DISCHARGE:  01/09/2008                               DISCHARGE SUMMARY   ADMISSION DIAGNOSES:  1. Avascular necrosis with collapse of femoral head, right hip.  2. Anxiety.  3. History of acute respiratory distress syndrome, following a 13-hour      back surgery in the past.  4. History of pneumonia.  5. Hiatal hernia.  6. History of diverticulosis.  7. History of chronic hepatitis.  8. Osteoporosis.  9. Degenerative disk disease.  10.Post-menopausal.   DISCHARGE DIAGNOSES:  1. Avascular necrosis of the right hip, status post right total hip      arthroplasty.  2. Anxiety.  3. History of acute respiratory distress syndrome, following a 13-hour      back surgery in the past.  4. History of pneumonia.  5. Hiatal hernia.  6. History of diverticulosis.  7. History of chronic hepatitis.  8. Osteoporosis.  9. Degenerative disk disease.  10.Post-menopausal.   PROCEDURE:  On January 06, 2008, right total hip.  Surgeon was Dr. Ollen Gross and assistant Alexzandrew L. Perkins, P.A.C.  Anesthesia was  general.   CONSULTATIONS:  None.   HISTORY:  [name] is a 60 year old female with osteonecrosis of the right  hip with a rapid progression in collapse of the femoral head.  She has  been essentially bedridden for the past week.  She now presents for a  total hip arthroplasty.   LABORATORY DATA:  Checked the preoperative labs done prior as an  outpatient.  Hemoglobin 15.9, hematocrit 48.6, white cell count 10.8,  platelets 444.  Metabolic panel all within normal limits.  Lipid  profile:  Cholesterol 203, triglycerides 63, HDL cholesterol 81, LDL  cholesterol 109.  T4 level at 5.3, TSH level  2.167.   Electrocardiogram on January 10, 2008:  Abnormal P-terminal with a sinus  rhythm, possible left atrial abnormality.   Serial CBCs were followed.  Hemoglobin down to 9.6 and then 8.8.  Last  noted hemoglobin was 9.  Serial BMETs were followed:  Sodium did drop  down to 133 postop and came back up to 138.  The remainder of the  electrolytes remained within normal limits.  Serum pro-times followed.  The last INR was 2.1.   X-RAYS:  Chest x-ray on January 06, 2008:  Post-surgical changes,  scarring in the left lung, blunting of the left costophrenic angle,  favored to represent scarring, likely chronic finding, rather than  pleural effusion.  No cervical changes, lower thoracic or visualized  lumbar spine.  Right hip films preop on January 06, 2008:  Avascular  necrosis, right hip with severe lateral subluxation of the femoral head.  Portable pelvis and hip on January 06, 2008:  Satisfactory right hip  replacement.   HOSPITAL COURSE:  The patient was admitted to  [facility] and  tolerated her procedure well.  Later she was sent to the recovery room  and then to the orthopedic floor.  She was started on a PCA for pain  control following surgery, tolerating medications well.  She was  actually doing very well on the morning of day one.  She could tell a  big difference from her severe preoperative pain.  The pain was under  much better control.  She started to get out of bed.  A drop in sodium,  felt to be dilutional, actually urinary output.  Hemoglobin was 9.6.  Started on iron.  Started on therapy with partial weightbearing.  By day  two was doing better.  Felt to be able to go home in the next couple of  days.  Continue on iron supplement for low hemoglobin.  She was  asymptomatic.  A dressing change.  The incision looked good.  Continue  progressive ambulation, partial weightbearing, right leg 115 feet.  By  postoperative day three getting over 250 feet.  Hemoglobin  was stable at  9.  INR therapeutic at 2.1.  She was discharged home.   DISCHARGE PLAN:  The patient is discharged home on January 09, 2008.   DISCHARGE MEDICATIONS:  1. Nu-Iron.  2. Percocet.  3. Robaxin.  4. Coumadin.  5. Colace.   ACTIVITY:  Partial weightbearing, right leg 25%-50%.  Hip precautions  and total hip protocol.   FOLLOWUP:  Follow up in two weeks.   DISPOSITION:  Discharged home.   CONDITION ON DISCHARGE:  Improving.     Alexzandrew L. Perkins, P.A.C.      Ollen Gross, M.D.  Electronically Signed   ALP/MEDQ  D:  02/23/2008  T:  02/23/2008  Job:  [id number]

## 2011-05-07 NOTE — Discharge Summary (Signed)
[name], [name]                 ACCOUNT NO.:  [id number]   MEDICAL RECORD NO.:  [id number]          PATIENT TYPE:  INP   LOCATION:  1538                         FACILITY:  [facility]   PHYSICIAN:  Ollen Gross, M.D.    DATE OF BIRTH:  [birth year]   DATE OF ADMISSION:  02/28/2008  DATE OF DISCHARGE:  03/03/2008                               DISCHARGE SUMMARY   ADMITTING DIAGNOSES:  1. Right periprostatic femur fracture.  2. Recent right total hip arthroplasty.  3. Anxiety.  4. History of pneumonia.  5. Hiatal hernia.  6. Diverticulosis.  7. History of chronic hepatitis in remission.  8. Degenerative degenerative disk disease.  9. Osteoporosis.  10.History of ARDS (acute respiratory distress syndrome) following a      13-hour back surgery.  11.Postmenopausal.   DISCHARGE DIAGNOSES:  1. Status post ORIF, right periprosthetic femur fracture.  2. Mild postop blood loss anemia.  3. Mild postop hyponatremia improved.  4. Recent right total hip arthroplasty.  5. Anxiety.  6. History of pneumonia.  7. Hiatal hernia.  8. Diverticulosis.  9. History of chronic hepatitis in remission.  10.Degenerative degenerative disk disease.  11.Osteoporosis.  12.History of ARDS (acute respiratory distress syndrome) following a      13-hour back surgery.  13.Postmenopausal.   PROCEDURE:  On February 29, 2008, open reduction internal fixation right  periprosthetic femur fracture with femoral component revision.  Surgeon  Dr. Lequita Halt assistant Avel Peace, PA-C.   CONSULTS:  None.   BRIEF HISTORY:  Ms. Mccuistion is a 60 year old female with an uncomplicated  right total hip done approximately 2 months ago.  She was doing well and  at the beach over the weekend.  She was in a laundry room and slipped  and fell twisting let and immediate onset of pain deformity.  She was  brought back to [facility] by her husband, taken to the ER and found to  have a periprostatic femur fracture admitted by Dr. Shon Baton  who was on  call covering for Dr. Lequita Halt.  She was placed at bedrest and seen on  the following day by Dr. Lequita Halt and added on for surgery.   LABORATORY DATA:  Preop CBC showed hemoglobin of 12.3, hematocrit 36.2,  white cell count 9.6, platelets 379.  Chem panel on admission all within  normal limits.  Serial B mets were followed.  Hemoglobin did drop down  10.9 and 9.5.  She was given 2 units of blood during the procedure.  Postop hemoglobin 10.9 drifted down to 9.5.  last H&H 8.9 and 25.7.  Serial protimes followed for Coumadin protocol.  Last PT/INR 16.0 and  1.3.  Serial B-mets were followed.  Sodium did drop down from 137-134  back up to 138.  Remaining electrolytes remained within normal limits.   X-rays right femur film February 29, 2008, ORIF right femur.  No adverse  features.   EKG February 28, 2008 normal sinus rhythm, normal EKG unconfirmed.   HOSPITAL COURSE:  The patient was admitted to [facility],  placed at bedrest.  She was admitted on March 8 by  Dr. Shon Baton for the  above-stated problem.  She was placed at bedrest, traction and pain  medications.  She was seen back on morning rounds and transferred care  from Dr. Shon Baton over to Dr. Lequita Halt.  She was set up for surgery and  underwent the above procedure the following evening on February 29, 2008.  Tolerated procedure well.  Later transferred to the orthopedic floor.  Started back on her IV antibiotics for pain, also p.o. medications.  Started on Coumadin for DVT prophylaxis.  She had a fair amount of blood  loss during surgery, so she was given 2 units during surgery and post  transfusion and postop hemoglobin was up to 10.9.  She was actually  doing fairly well on the morning of day one, briefly discussed surgical  findings.  We felt that she would needed to be touchdown weightbearing.  Home medications were restarted.  Started getting up out of bed.  By day  #2, she was doing even better.  Hemoglobin was a little  lower down to  9.5.  She was asymptomatic with this.  Slight elevation in temperature  on the evening of day #1, morning of day #2 to 101.  Encouraged  incentive spirometer and antipyretics.  Placed her on the iron for the  lower hemoglobin.  She was walking about 200 feet on day #2.  By day #3,  she was doing well.  Hemoglobin was 8.9.  She was asymptomatic with  this.  Dressing change, incision looked good.  She had a couple of small  blisters proximal to the incision, put Tegaderm over this.  She had one  on the anterior lateral thigh, just anterior to the incision.  Also, put  a Tegaderm on that blister.  INR had not quite got to a therapeutic  level.  We gave her low dose of Lovenox in-house and decided to do one  more at home.  She was doing well, will discharge home.   DISCHARGE PLAN:  1. Discharged home on March 03, 2008.  2. Discharge diagnoses, please see above.  3. Discharge medications are Coumadin, Nu-Iron, Percocet, Robaxin,      Lovenox.   FOLLOW UP:  She is to follow up next Friday on March 11, 2008, with Dr.  Lequita Halt.  Call the office for an appointment.   DISCHARGE INSTRUCTIONS:  1. Activity.  Touchdown weightbearing, essentially brace at all times      when she is walking and also brace at night when she is sleeping.      Although, she may have it off and loosen it up when she is sitting      in the chair and bed during the day.  2. Diet.  Resume home diet.   DISPOSITION:  Home.   CONDITION ON DISCHARGE:  Improved.      Alexzandrew L. Perkins, P.A.C.      Ollen Gross, M.D.  Electronically Signed    ALP/MEDQ  D:  03/03/2008  T:  03/04/2008  Job:  [id number]   cc:   Ollen Gross, M.D.  Fax: [phone]

## 2011-05-07 NOTE — Discharge Summary (Signed)
NAMEERCEL, NORMOYLE                 ACCOUNT NO.:  [id number]   MEDICAL RECORD NO.:  [id number]          PATIENT TYPE:  INP   LOCATION:  1606                         FACILITY:  [facility]   PHYSICIAN:  Ollen Gross, M.D.    DATE OF BIRTH:  [birth year]   DATE OF ADMISSION:  03/17/2009  DATE OF DISCHARGE:  03/20/2009                               DISCHARGE SUMMARY   DISCHARGE DIAGNOSES:  1. Unstable right total hip arthroplasties.  2. Possible nosocomial pneumonia.   OTHER DIAGNOSES:  1. Anxiety.  2. History of pneumonia.  3. Hiatal hernia.  4. Diverticulosis.  5. History of chronic hepatitis.  6. Degenerative disk disease.  7. Osteopenia.   PROCEDURES:  Right hip acetabular revision to a constrained socket on  March 17, 2009.   CONSULTS:  None.   HOSPITAL COURSE:  Ara is a 60 year old female with a long complex  history in regards to her right hip.  She recently has had 3  dislocations of the hip.  She is admitted to the hospital via the  operating room on March 17, 2009 at which time she had an acetabular  revision to a constrained liner.  Procedure was tolerated well without  complication.  Procedures was performed by Dr. Lequita Halt, assisted by Avel Peace, PA-C.  Estimated blood loss was less than 100.  She went to the  recovery room postoperatively in stable condition.  After the recovery  room she was taken to the orthopedic floor.  She was afebrile with  stable vital signs at that time.  Her right lower extremity remained  neurovascularly intact throughout the entire hospital course.  On  postoperative day #1, she had a hemoglobin of 11.2, sodium 134,  potassium 3.8, chloride 104, CO2 26, BUN 8, creatinine 0.48 and glucose  140.  She was ambulating, weightbearing as tolerated on her right lower  extremity.  She got up with physical therapy.  On the evening of March 18, 2009, she began to feel some upper respiratory symptoms.  She spiked  a temperature of 102.8.  She had a  chest x-ray performed which showed  interstitial edema.  She was approximately 3 liters positive on her  fluid intake.  She had IV Lasix at that time. The Lasix did lead to  excellent diuresis.  Her chest x-ray on March 19, 2009 did show  significant improvement in her interstitial edema.  Due to her history  of pneumonia and due to the elevated temperature, we started her on IV  Rocephin 1 g q.12 hours.  Her last temperature spike was 102 degrees on  early morning of March 19, 2009.  She went over 24 hours and was  afebrile with stable vitals starting from 6 a.m. on March 19, 2009 going  through 8 a.m. on March 20, 2009.  She felt much better and had far less  upper respiratory congestion as of March 20, 2009.  She said that her  breathing was greatly improved.  Her oxygen saturations remained over  92% the entire time. She is felt to be stable and ready for  discharge  after her IV dose of Rocephin on 03/20/2009.  She is tolerating a  regular diet.  She met all of her physical therapy goals on March 19, 2009.  As of 03/20/2009, she is tolerating a regular diet.  Her  hemoglobin is 12.4 on March 19, 2009 and is 13.2 on March 20, 2009.  White count very mildly elevated at 10.  INR is only 1.1.  BMET was  within normal limits.  Her hip incision remained clean with no erythema  or exudate throughout her hospital course.  She is discharged home in  stable condition on March 20, 2009.   DISPOSITION:  Discharge home on March 20, 2009 in stable condition.  She  received 1 dose of Lovenox 40 mg subcu prior to discharge.   DISCHARGE MEDICATIONS:  1. Coumadin per pharmacy protocol to keep INR between 2 and 3 for a      total of 3 weeks postop.  2. Percocet 10/325 one to two every 4-6 hours as needed for pain.  3. Robaxin 500 mg p.o. every 6 hours as needed for spasm.  4. Levaquin 500 mg daily for 7 days.  Her home medications of:  1. Neurontin 800 mg 4 times a day.  2. Prozac 40 mg daily.   3. Xanax 2 mg q.p.m.  4. Clonazepam 0.5 mg q.p.m.  5. Prilosec 20 mg daily.   Plan will be to follow up in the office in 8 days for staple removal.  She will get home health PT and home health nursing.  Once again,  discharged home in stable condition.      Ollen Gross, M.D.  Electronically Signed     FA/MEDQ  D:  03/20/2009  T:  03/20/2009  Job:  [id number]

## 2011-05-07 NOTE — H&P (Signed)
[name], [name]                 ACCOUNT NO.:  [id number]   MEDICAL RECORD NO.:  [id number]          PATIENT TYPE:  INP   LOCATION:  1611                         FACILITY:  [facility]   PHYSICIAN:  Ollen Gross, M.D.    DATE OF BIRTH:  12-02-1951   DATE OF ADMISSION:  03/30/2009  DATE OF DISCHARGE:                              HISTORY & PHYSICAL   CHIEF COMPLAINT:  Infected right hip.   HISTORY OF PRESENT ILLNESS:  Patient is a 60 year old female well known  to Dr. Ollen Gross who had previously undergone multiple hip  operations.  Most recently, she underwent an acetabular revision to a  constrained liner on March 17, 2009, due to multiple dislocations.  During the hospital course, over those 3 days, it was felt she developed  nosocomial pneumonia.  She was placed on IV Rocephin and sent home on  Levaquin.  Her white count at the time of discharge had normalized.  Unfortunately, around 24 hours prior to admission on March 29, 2009, she  developed some fevers and hip pain.  She went to her primary care  Elfida Shimada and her white count was elevated at 33,000.  She was sent for a  CT spiral to rule out PE.  Due to the hip pain and elevated white count,  Dr. Lequita Halt was notified.  She was sent to the emergency room on the  date admission of March 30, 2009.  The hip was aspirated superficially  and found to have a purulent amount of material indicating infection in  the right hip with a possible hematogenous spread.  She was admitted for  IV antibiotics and she will be preoped for an I and D of the hip and  washout.  She is currently anticoagulated so we will have to allow her  INR and pro times to normalize.   ALLERGIES:  AMOXICILLIN.   CURRENT MEDICATIONS:  1. Coumadin.  2. Percocet.  3. Robaxin.  4. Neurontin.  5. Avelox.  6. Prozac.  7. Xanax.  8. Clonazepam.  9. Prilosec.   PAST MEDICAL HISTORY:  1. Anxiety.  2. History of pneumonia with a recent hospital stay.  3. Hiatal  hernia.  4. Diverticulosis.  5. History of chronic hepatitis.  6. Degenerative disk disease.  7. Osteopenia.  8. A past history ARDS (acute respiratory distress syndrome) following      a 13-hour back surgery.  9. Postmenopausal.   PAST SURGICAL HISTORY:  1. Carpal tunnel surgery.  2. Six back surgeries and multiple fusions and hardware replacement.  3. Knee surgery x2.  4. Throat cyst excision, 2004.  5. Arterial surgery of arm.  6. Left total hip in 2007.  7. Right total hip in January 2009.  8. Undergone a left acetabular revision, February 2007, with revision      and conversion over to a constrained liner.  9. She has undergone ORIF of periprosthetic fracture with revisions of      the ORIF periprosthetic fracture and most recently right total hip      revision conversion over to a constrained liner on March 17, 2009.   SOCIAL HISTORY:  Married.  Social intake of alcohol.  One child.  Husband will be assisting with her care after surgery.  Does smoke 1/2  pack per day.   FAMILY HISTORY:  Father with heart disease, stroke, and cancer.   REVIEW OF SYSTEMS:  She has had some increased fevers, increasing pain  over the past 24 hours prior to admission.  NEURO:  No seizure, syncope,  or paralysis.  RESPIRATORY:  Recent bout of pneumonia but denied any  productive cough or hemoptysis.  CARDIOVASCULAR:  No chest pain or  orthopnea.  GI:  No nausea, vomiting, diarrhea, or constipation.  GU:  No dysuria, hematuria, or discharge.  MUSCULOSKELETAL:  Right hip pain.   PHYSICAL EXAM:  VITAL SIGNS: Temp 98.1.  Pulse 105.  Respirations 16.  Blood pressure 92/44.  GENERAL:  Patient is a 60 year old, small, petite frame, white female,  well nourished, well developed, no acute distress.  She is alert,  oriented, and cooperative.  HEENT:  Normocephalic, atraumatic.  Pupils round and reactive.  EOMs  intact.  NECK:  Supple.  CHEST:  She does have a little upper airway congestion, dry  cough.  HEART:  Regular rate and rhythm with a grade 2/6 systolic ejection  murmur.  ABDOMEN:  Soft, nontender.  Bowel sounds present.  RECTAL:  Not done, not pertinent to present illness.  BREASTS:  Not done, not pertinent to present illness.  GENITALIA:  Not done, not pertinent to present illness.  EXTREMITIES:  Right hip.  She does have staples intact.  She does have  some soft tissue swelling.  No drainage or erythema is noted  superficially. She underwent an aspiration superficially in the  emergency department and had aspirate showing purulent material.   LABORATORY DATA ON ADMISSION:  Admission CBC showed a white count of  31.4, hemoglobin of 13.4, hematocrit of 40.0, does show a shift,  elevated neutrophils of 97, platelets are elevated at 475.  Pro time/INR  elevated on admission 22.7 with an INR 1.9.  Her BMET on admission  showed low sodium of 130, low potassium of 3.3 remaining BMET within  normal limits.  The stat Gram stain provided on the aspirate did not  show any organisms in the wound culture, no organisms shown on the  smear, and the wound culture is preliminary but no growth within 24  hours.   IMPRESSION:  Right hip infection.   PLAN:  She will be admitted to the hospital and started on IV  antibiotics.  Coumadin will be discontinued and held and allow her INR  to normalize and then she will be taken to the OR to undergo a right hip  washout I and D.  Surgery is to be performed by Dr. Ollen Gross.      Alexzandrew L. Perkins, P.A.C.      Ollen Gross, M.D.  Electronically Signed    ALP/MEDQ  D:  03/31/2009  T:  03/31/2009  Job:  [id number]

## 2011-05-10 NOTE — H&P (Signed)
NAMEABIGAYL, HOR                 ACCOUNT NO.:  [id number]   MEDICAL RECORD NO.:  [id number]          PATIENT TYPE:  INP   LOCATION:  NA                           FACILITY:  [facility]   PHYSICIAN:  Ollen Gross, M.D.    DATE OF BIRTH:  July 22, 1951   DATE OF ADMISSION:  02/24/2006  DATE OF DISCHARGE:                                HISTORY & PHYSICAL   DATE OF OFFICE VISIT:  February 14, 2006,   CHIEF COMPLAINT:  Unstable left total hip.   HISTORY OF PRESENT ILLNESS:  The patient is a 60 year old female who  underwent a left total hip replacement back in October 1996.  She initially  did well but about six weeks postoperative had a dislocation.  She has been  treated with closed reduction and had dislocation again a few days later and  six to seven weeks after that she had a third dislocation.  She was found to  have an unstable hip.  She had been seen by Dr. Georgena Spurling back in  December and he felt it was too early to do anything.  The films were sent  to a [employer] consult in [place] and felt that the acetabular component  needed to revised.  There was some concern about her having had a possible  ceramic breakage and that she may need either an acetabular or a femoral  component revision to provide her with a more stable construct.  It was  decided that she would probably undergo change from ceramic on ceramic over  to metal on polyethylene but she does understand if there is some breakage  that she may have some difficulty with the polyethylene cup due to ceramic  particles.  It is also of note with her original surgery it was noted in  PACU that her leg was too long so they went back into the operating room the  same day to put in a smaller stem.  At that point she sustained a femur  fracture and has one single cerclage wire around the calcar so it was  probably not a long distal split of the femur.  This also raises the  possibility that the instability could be coming from the  femoral side.  Due  to the significant history it was felt she will require revision surgery.  The risks and benefits of this procedure have been discussed with the  patient at length.  She elects to proceed with surgery.   ALLERGIES:  No known drug allergies.  INTOLERANCES:  AUGMENTIN causes nausea  and vomiting.   CURRENT MEDICATIONS:  1.  Femhrt 1/5 weekly.  2.  Xanax 2 mg at night  3.  Prozac 20 mg daily.  4.  Neurontin 400 mg four times a day.  5.  Percocet p.r.n.  6.  Avinza 60 mg daily.  7.  Actonel weekly.  8.  Also takes a sleeping pill at night.   PAST MEDICAL HISTORY:  Anxiety.  Past history of pneumonia.  Hiatal hernia.  Diverticulosis.  She also gives a chronic history of hepatitis in remission.  Degenerative disk disease.  Osteoporosis.  Also gives a history of acute  respiratory distress syndrome (ARDS), following a 13 hour back surgery  incurring intensive care unit and treatment.  Also postmenopausal.   PAST SURGICAL HISTORY:  Carpal tunnel syndrome in 1972.  She has undergone  six back surgeries with multiple levels with fusions dating from 70 to  2006.  Knee surgery x2.  Cyst surgery in her throat removed in 2004.  Arterial surgery in her arm in 1974.   SOCIAL HISTORY:  Married, half-pack a day smoker, one glass of wine at  night.  One child.  Husband will be assisting with care after surgery.   FAMILY HISTORY:  Father who is deceased with history of heart disease and  stroke and also cancer.   REVIEW OF SYSTEMS:  GENERAL:  No fevers, night sweats.  NEUROLOGICAL:  No  seizure, syncope or paralysis .  RESPIRATORY:  Past history of pneumonia and  also history ARDS following a previous intensive back surgery in 2004.  Denies any shortness of breath, productive cough or hemoptysis at this time.  CARDIOVASCULAR:  No angina, chest pain or orthopnea. GASTROINTESTINAL:  No  nausea, vomiting, diarrhea, constipation although she does have a hiatal  hernia and  diverticulosis.  GENITOURINARY:  No dysuria, hematuria,  discharge. MUSCULOSKELETAL:  Left hip as above.   PHYSICAL EXAMINATION:  VITAL SIGNS:  Pulse 78, respirations 16, blood  pressure 118/82.  GENERAL APPEARANCE: 60 year old white female, petite, thin frame.  No acute  distress.  She is awake, alert, oriented and cooperative, very pleasant at  the time of examination.  She appears to be an excellent historian. She is  accompanied by her husband.  HEENT: Normocephalic, atraumatic.  Pupils equal, round, reactive to light  and accommodation..  Oropharynx clear. Extraocular movements intact.  NECK: Supple.  No carotid bruits.  CHEST:  Clear anterior and posterior chest wall, no rhonchi, rales or  wheezing.  HEART:  Regular rate and rhythm with a faint grade 2/6 early systolic  ejection murmur, best heard over the aortic point on the thoracic chest  wall.  S1, S2 noted.  ABDOMEN:  Soft, flat, nontender.  Bowel sounds present.  RECTAL, BREASTS, GENITOURINARY:  Not done, not pertinent to present illness.  EXTREMITIES:  Left hip:  She does ambulate with a mildly antalgic gait.  Hip  flexion shows about 90 degrees internal and external rotation of 20 to 30  respectively and abduction of about 30.   IMPRESSION:  1.  Unstable left hip.  2.  Anxiety.  3.  Past history of adult respiratory distress syndrome (ARDS) following      intensive 13 hour back surgery.  4.  Past history of pneumonia.  5.  Hiatal hernia.  6.  History of Diverticulosis.  7.  History of chronic hepatitis.  8.  Osteoporosis.  9.  Degenerative disk disease.  10. Postmenopausal.   PLAN:  The patient is admitted to [facility] to undergo revision  of the left total hip arthroplasty.  The surgery will be performed by Dr.  Ollen Gross.      Alexzandrew L. Julien Girt, P.A.      Ollen Gross, M.D.  Electronically Signed   ALP/MEDQ  D:  02/23/2006  T:  02/24/2006  Job:  [id number]

## 2011-05-10 NOTE — Op Note (Signed)
[name], [name]                 ACCOUNT NO.:  [id number]   MEDICAL RECORD NO.:  [id number]          PATIENT TYPE:  INP   LOCATION:  0001                         FACILITY:  [facility]   PHYSICIAN:  Ollen Gross, M.D.    DATE OF BIRTH:  [birth year]   DATE OF PROCEDURE:  07/18/2006  DATE OF DISCHARGE:                                 OPERATIVE REPORT   PREOPERATIVE DIAGNOSIS:  Recurrent instability, left total hip.   POSTOPERATIVE DIAGNOSIS:  Recurrent instability, left total hip.   PROCEDURE:  Left hip acetabular revision to constrain liner.   SURGEON:  Ollen Gross, M.D.   ASSISTANT:  Alexzandrew L. Julien Girt, P.A.   ANESTHESIA:  General.   ESTIMATED BLOOD LOSS:  100.   DRAINS:  Hemovac times one.   COMPLICATIONS:  None.   CONDITION:  Stable to recovery.   BRIEF CLINICAL NOTE:  [name] is a 60 year old female who had a left total  hip arthroplasty done in [place] last year.  She had multiple  dislocations.  We revised her acetabulum.  She did extremely well initially.  She later had two dislocation episodes.  She presents now for revision to a  constrained liner.   PROCEDURE IN DETAIL:  After successful administration of general anesthetic,  the patient was placed in the right lateral decubitus position with the left  side up and held with the hip positioner.  The left lower extremity was  isolated with __________  plastic drapes and prepped and draped in the usual  sterile fashion.  Short posterolateral incision was made with a 10-blade  through the subcutaneous tissue to the level of the fascia lata, which was  incised in line with the skin incision.  A small amount of fluid was present  in the joint.  It had a normal appearance to it.  The hip was inspected and  then dislocated.  She was very stable with full extension, flexion, and  rotation, 70 degrees of flexion, 40 degrees abduction, and 90 degrees  internal rotation.  Then at 90 degrees of flexion and 40  degrees of internal  rotation, she dislocated.  The femoral head was removed from the femoral  component.  Her anteversion looked about 10-15 degrees, which was  acceptable.  The femur was then retracted anteriorly to gain acetabular  exposure.  The position of the cup was anatomic.  The liner extraction  device was used to remove the polyethylene liner.  The cut was tested and  was extremely stable.  We then placed the 54-mm Pinnacle constrained liner  and impacted it into the acetabulum.  She previously had a 32 +10 head, and  I downsized it to a 32 +5 to eliminate the skirt on the femoral neck to  prevent impingement.  The 32 +5 head was placed, and then the hip was  reduced, and then the locking ring is impacted into position.  I was able to  extend her to neutral with about 20 degrees external rotation, and at 70  degrees of flexion, she had 40 degrees adduction and about 90 degrees of  internal rotation, and at 90 degrees of flexion had about 50 degrees of  internal rotation, but there was no evidence of any torque on the liner, and  the hip remained very stable.  The wound was then copiously irrigated with  saline solution.  The posterior pseudocapsule was reattached to the femur  through drill holes.  The fascia lata was closed over a Hemovac drain with  interrupted #1 Vicryl, subcu closed with #1 then 2-0 Vicryl, and skin closed  with staples.  Drain was hooked to suction.  Incision cleaned and dried and  a bulky sterile dressing applied.  She was then awakened and transported to  recovery in stable condition.      Ollen Gross, M.D.  Electronically Signed     FA/MEDQ  D:  07/18/2006  T:  07/18/2006  Job:  [id number]

## 2011-05-10 NOTE — H&P (Signed)
NAMEAIDEE, LATIMORE                 ACCOUNT NO.:  [id number]   MEDICAL RECORD NO.:  [id number]          PATIENT TYPE:  INP   LOCATION:  1608                         FACILITY:  [facility]   PHYSICIAN:  Ollen Gross, M.D.    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   HISTORY OF PRESENT ILLNESS:  The patient is a 60 year old female well-  known Dr. Homero Fellers Aluisio having previously undergone a total hip  replacement on the right knee and sustained a periprosthetic fracture  with ORIF and had to be revised because of hardware loosening.  Unfortunately, she has gone on now to have a nonunion.  It was felt due  to the findings that she would benefit from undergoing ORIF and femoral  revision of the total hip replacement.  Risks and benefits discussed.  The patient was subsequently admitted to the hospital.   ALLERGIES:  NO KNOWN DRUG ALLERGIES.  INTOLERANCES AUGMENTIN.   CURRENT MEDICATIONS:  Neurontin, Prozac, Xanax, clonazepam, Prilosec,  aspirin, Percocet, Cipro.   PAST MEDICAL HISTORY:  1. Anxiety.  2. History of pneumonia.  3. Hiatal hernia.  4. Diverticulosis.  5. History of chronic hepatitis.  6. Degenerative disk disease.  7. Osteoporosis.  8. Prior history of ARDS (acute respiratory distress syndrome)      following a 13-hour back surgery.  9. Postmenopausal.   PAST SURGICAL HISTORY:  1. Carpal tunnel surgery.  2. Six back surgeries with multiple fusions.  3. Knee surgery x2.  4 . Throat cyst excision 2004.  1. Arterial surgery of her arm.  2. Left acetabular revision February 2007.  3. Revision total hip with conversion to a constrained liner.  4. Right total hip ORIF of periprosthetic fracture and revision of      ORIF with periprosthetic fracture.   SOCIAL HISTORY:  Married.  Social intake of  alcohol.  One child.  Husband will be assisting with care.   FAMILY HISTORY:  Father with heart disease, stroke and cancer.   REVIEW OF SYSTEMS:  GENERAL:  No fevers, chills, night sweats.  NEUROLOGICAL:  No seizures, syncope or paralysis.  RESPIRATORY:  No  shortness of breath, productive cough or hemoptysis.  CARDIOVASCULAR:  No chest pain, no orthopnea.  GI:  No nausea, vomiting, diarrhea or  constipation.  GU:  No dysuria, hematuria or discharge.  MUSCULOSKELETAL:  Right femur.   PHYSICAL EXAMINATION:  VITAL SIGNS:  Temperature 98, pulse 78,  respirations 16, blood pressure 109/71.  GENERAL:  A 60 year old small, thin, petite white female, well-  nourished, well-developed, no acute distress, accompanied by her  husband.  HEENT: Normocephalic, atraumatic.  Pupils are reactive.  Oropharynx  clear.  EOMs intact.  NECK:  Supple.  CHEST:  Clear anterior pressures chest walls.  HEART:  Regular rate and rhythm.  Systolic ejection murmur.  ABDOMEN:  Soft, flat, nontender.  Bowel sounds present.  BREAST/RECTAL/GENITOURINARY:  Not done, not pertinent to  present  illness.  EXTREMITIES:  Right hip flexion about 95-100 degrees, minimal internal  and external rotation, secondary to pain to the distal thigh.  Previous  incision is well-healed.   IMPRESSION:  Nonunion of the right periprosthetic fracture.   PLAN:  The patient was admitted to [facility] to undergo ORIF  and femoral vision of the right femur.  Surgery is to be performed by  Dr. Ollen Gross.      Alexzandrew L. Perkins, P.A.C.      Ollen Gross, M.D.  Electronically Signed    ALP/MEDQ  D:  10/25/2008  T:  10/25/2008  Job:  [id number]

## 2011-05-10 NOTE — Op Note (Signed)
[name], [name]                 ACCOUNT NO.:  [id number]   MEDICAL RECORD NO.:  [id number]          PATIENT TYPE:  INP   LOCATION:  NA                           FACILITY:  [facility]   PHYSICIAN:  Ollen Gross, M.D.    DATE OF BIRTH:  Feb 01, 1951   DATE OF PROCEDURE:  02/24/2006  DATE OF DISCHARGE:                                 OPERATIVE REPORT   PREOPERATIVE DIAGNOSIS:  Unstable left total hip arthroplasty.   POSTOPERATIVE DIAGNOSIS:  Unstable left total hip arthroplasty.   PROCEDURE:  Left acetabular revision.   SURGEON:  Ollen Gross, M.D.   ASSISTANT:  Avel Peace, PA-C.   ANESTHESIA:  General.   ESTIMATED BLOOD LOSS:  200.   DRAINS:  Hemovac x1.   COMPLICATIONS:  None.   CONDITION:  Stable to recovery.   BRIEF CLINICAL NOTE:  [name] is a 60 year old female who had a left total hip  arthroplasty done last fall. She had a ceramic on ceramic [employer]  prosthesis. She has gone on to have three dislocations. She has had  significant pain and continues to have an unstable feel to the hip. She  presents now for total hip arthroplasty revision of either femoral,  acetabular or both components.   PROCEDURE IN DETAIL:  After successful administration of general anesthetic,  the patient was placed in a right lateral decubitus position with the left  side up and held with the hip positioner. The left lower extremity was  isolated from her perineum with plastic drapes and prepped and draped in the  usual sterile fashion. We utilized the posterior portion of her previous  incision and extended it a little bit distally. The skin is cut with a 10  blade through the subcutaneous tissue to the level of the fascia lata which  was incised in line with the skin incision. The sciatic nerve was palpated  and protected. The short rotators were already stripped off the femur from  her dislocation. Minimal fluid was encountered upon entering the joint. This  was a clear appearing fluid.  The hip was then placed through a range of  motion. She dislocated very easily. At 70 degrees of flexion and 20 degrees  of internal rotation, she was subluxing. By the time we internally to  rotated 40 degrees, she had dislocated. I reduced her again and at 90  degrees of flexion, she would dislocate with barely any rotation. I then  dislocated the hip again and we removed the femoral head. The femoral head  had significant scratching on it but there is no evidence of any crack in  the ceramic. There was some metal scratching on it most likely coming from  where the hip was reduced after the dislocation. I checked the femoral  component and the version was about 25 degrees. We threaded the impaction  device into the femoral component and the component is stable both to axial  and torsional stressing. Given the stability and given the good position of  it, we decided to leave the femoral component intact.   The femur was retracted anteriorly to  gain acetabular exposure. I placed the  acetabular positioner on the shelf and it was abducted at least 60 degrees.  I felt that that was a major contributor to her instability. We then removed  the ceramic liner from the Trident cup using the extraction device. The two  dome screws are then removed from the shell. I threaded the impaction device  into the cup and it was stable. I then disrupted the interface between the  acetabular shell and bone starting with one-quarter inch curved osteotome  just to disrupt the interface and then using the [place] acetabular  osteotomes to further disrupt the interface between the bone and shell. The  shell was easily removed with absolutely no bone loss. This is a 52 mm  shell. I then reamed more centrally to get better bony coverage with a 49  and then course in increments of 2 up to 53 mm. A 54 mm pinnacle acetabular  shell was then placed in anatomic position. It had a great purchase and then  is transfixed  with three additional dome screws with excellent purchase. The  position of this is about 40 degrees of anteversion 15-20 degrees of forward  flexion. We then placed a 32-mm neutral plus 4 trial liner. I trialed  multiple femoral heads. These are the [employer] C taper femoral heads. With  the 32 plus 0, there was gross laxity. With a plus 5, the soft tissue  tension was a little better, but there was still some soft tissue laxity.  With the 32 plus 10, it  corrected the soft tissue laxity. With both the +10  and +5, she had outstanding stability with full extension, full external  rotation, 70 degrees flexion, 40 degrees adduction, 90 degrees internal  rotation. Then with the plus 10, I could flex her to 90 and internally  rotate 70 but with the +5, I could flex to 90 and internally rotate only 30  before it dislocated. I thus decided to go with the 32 plus 10 head. The  permanent C tapered 32 plus 10 head is then placed onto the femoral  component. The hip is then reduced with outstanding stability as above. By  placing the left leg on top of the right, she was only a few millimeters  longer on the left than right and I felt this would be a worthwhile  sacrifice of the additional length in order to provide stability for her.  The wound is then copiously irrigated with saline solution and the posterior  soft tissues reattached to the femur through drill holes. The fascia lata is  then closed over a hemovac drain with interrupted #1 Vicryl, subcu closed  with #1 and 2-0 Vicryl and subcuticular with running 4-0 Monocryl. The  incision is cleaned and dried and Steri-Strips and a bulky sterile dressing  applied. A drain is hooked to suction and she is placed into a knee  immobilizer, awakened and transported to recovery in stable condition.      Ollen Gross, M.D.  Electronically Signed     FA/MEDQ  D:  02/24/2006  T:  02/25/2006  Job:  [id number]

## 2011-05-10 NOTE — H&P (Signed)
[name], [name]                 ACCOUNT NO.:  [id number]   MEDICAL RECORD NO.:  [id number]          PATIENT TYPE:  INP   LOCATION:  1506                         FACILITY:  [facility]   PHYSICIAN:  Ollen Gross, M.D.    DATE OF BIRTH:  1950/12/29   DATE OF ADMISSION:  07/18/2006  DATE OF DISCHARGE:  07/20/2006                                HISTORY & PHYSICAL   DATE OF OFFICE VISIT HISTORY AND PHYSICAL:  July 10, 2006.   CHIEF COMPLAINT:  Chronic left hip dislocation.   HISTORY:  The patient is a 59 year old female well known to Dr. Ollen Gross having previously undergone a left acetabular revision back in March  of this year for chronic dislocation.  She had a ceramic prosthesis at that  point, had three dislocations, proved unstable.  She underwent an acetabular  component revision.  Unfortunately, she has gone on to sustain dislocations  three times since her surgery.  [place]  dislocation occurred during intercourse and she had to be taken to the  hospital for closed reduction.  She has had two further dislocations bending  down to pick up things, she did not feel like she was in an awkward  position.  Due to the current dislocation and instability, it is felt she  would benefit from undergoing a constrained liner.   ALLERGIES:  NO KNOWN DRUG ALLERGIES.   INTOLERANCES:  Augmentin causes nausea and vomiting.   CURRENT MEDICATIONS:  Xanax, Prozac, Neurontin, Percocet, Avinza, Actonel,  clonazepam.   PAST MEDICAL HISTORY:  1. Anxiety.  2. History of pneumonia.  3. Hiatal hernia.  4. Diverticulosis.  5. Chronic history of hepatitis in remission.  6. Degenerative disk disease.  7. Osteoporosis.  8. History of ARDS (acute respiratory distress syndrome) following a 13      hour back surgery.  9. Postmenopausal.   PAST SURGICAL HISTORY:  1. Carpal tunnel syndrome, 1972.  2. Six back surgeries with multiple level fusions dated from  1992-2006.  3. Knee surgery x2.  4. Cysts removed from her throat in 2004.  5. Arterial surgery in her arm, 1974.  6. Left acetabular revision February, 2007.   SOCIAL HISTORY:  Married.  Half pack a day smoker.  One glass of wine.  One  child.  Husband will be able to assist with care after surgery.   FAMILY HISTORY:  Father deceased with a history of heart disease, stroke and  cancer.   REVIEW OF SYSTEMS:  GENERAL:  No fever, chills or night sweats.  URINARY:  No __________.  RESPIRATORY:  She did have a little green drainage and  cough, some upper respiratory symptoms about a week prior to surgery but she  was treated with a Z-Pak.  CARDIOVASCULAR:  No chest pain, no orthopnea.  GI:  No nausea, vomiting, diarrhea, constipation.  GU:  No dysuria,  hematuria or discharge.  MUSCULOSKELETAL:  Left hip.   PHYSICAL EXAM:  VITAL SIGNS:  Pulse 64, respirations 12, blood pressure  110/65.  GENERAL:  A 60 year old white female, petite,  thin-framed, no acute  distress.  She is awake, alert and oriented, cooperative, very pleasant,  excellent historian.  She is accompanied by her husband.  HEENT:  Normocephalic, atraumatic.  Pupils round and reactive.  Oropharynx,  sclera is intact.  NECK:  Supple, no carotid bruits.  CHEST:  Clear anterior and posterior chest.  HEART:  Regular rate and rhythm.  A grade 2/6 early systolic ejection murmur  best heard over the aortic point.  S1, S2.  ABDOMEN:  Soft, nontender, bowel sounds present.  BREASTS/GENITALIA:  Not done, not pertinent to present illness.  EXTREMITIES:  Left hip flexion 110, abduction 30, internal rotation 20,  external rotation 30, motor function intact.   IMPRESSION:  1. Recurrent dislocations left total hip.  2. Anxiety.  3. Past history of acute respiratory distress syndrome following a 13 hour      surgery.  4. Past history of pneumonia.  5. Hiatal hernia.  6. History of diverticulosis.  7. History of chronic hepatitis in  remission.  8. Osteoporosis.  9. Degenerative disk disease.  10.Postmenopausal.   PLAN:  Patient admitted to [facility].  Will undergo revision to a  constrained liner left hip.  The surgery will be performed by Dr. Ollen Gross.      Alexzandrew L. Julien Girt, P.A.      Ollen Gross, M.D.  Electronically Signed   ALP/MEDQ  D:  07/20/2006  T:  07/20/2006  Job:  [id number]   cc:   Ollen Gross, M.D.  Fax: [phone]

## 2011-05-10 NOTE — Discharge Summary (Signed)
[name], [name]                 ACCOUNT NO.:  [id number]   MEDICAL RECORD NO.:  [id number]          PATIENT TYPE:  INP   LOCATION:  1506                         FACILITY:  [facility]   PHYSICIAN:  Ollen Gross, M.D.    DATE OF BIRTH:  06/29/1951   DATE OF ADMISSION:  02/24/2006  DATE OF DISCHARGE:  02/27/2006                                 DISCHARGE SUMMARY   ADMISSION DIAGNOSES:  1.  Unstable left hip.  2.  Anxiety.  3.  Past history of adult respiratory distress syndrome following intensive      13-hour back surgery.  4.  Past history of pneumonia.  5.  Hiatal hernia.  6.  History of diverticulosis.  7.  History of chronic hepatitis.  8.  Osteoporosis.  9.  Degenerative disc disease.  10. Postmenopausal.   DISCHARGE DIAGNOSES:  1.  Unstable left total hip arthroplasty, status post left acetabular      revision.  2.  Anxiety.  3.  Past history of adult respiratory distress syndrome following intensive      13-hour back surgery.  4.  Past history of pneumonia.  5.  Hiatal hernia.  6.  History of diverticulosis.  7.  History of chronic hepatitis.  8.  Osteoporosis.  9.  Degenerative disc disease.  10. Postmenopausal.  11. Postoperative hypokalemia, improved.   PROCEDURE:  February 24, 2006, left acetabular revision.   SURGEON:  Ollen Gross, M.D.   ASSISTANT:  Alexzandrew L. Perkins, P.A.-C.   ANESTHESIA:  General anesthesia.   BRIEF HISTORY:  [name] is a 59 year old female with left total hip done last  fall.  She had a ceramic on ceramic Stryker prosthesis.  She has had three  dislocations, has had significant pain and unstable hip, now presents for  revision arthroplasty.   CONSULTATIONS:  None.   LABORATORY DATA:  Preoperative CBC with hemoglobin 13.8, hematocrit 41.4,  white cell count 6.7.  Postoperative hemoglobin 12.3, drifted down, last  noted H&H of 10.8 and 32.3.  PT and PTT preoperatively 13.7 and 34  respectively, INR 1.  __________ INR 15.4 and 1.2.   Chem panel within normal  limits.  Postoperative potassium dropped from 4.8 to 3.4, back up to 3.5.  Preoperative BUN negative.  Blood group and type O positive.   Hip and pelvis film February 24, 2006, AP view of pelvis reveals satisfactory  left hip replacement.  Preoperative left hip film on February 19, 2006,  bipolar left hip prosthesis without acute findings.   Previous EKG on the chart dated February 12, 2006, sinus rhythm, probable  normal variant, no acute changes.   Previous chest, October 07, 2005, some interval change in portion of the  patient's hardware, multiple prior spinal fusions.  Probable COPD, no acute  process.   HOSPITAL COURSE:  Admitted to [facility], tolerated procedure  well.  Later transferred to recovery room and then to orthopedic floor for  postoperative care.  Vital signs were followed.  Patient was placed on PCA  and p.o. analgesics.  Doing pretty well on the morning of day #1.  Anticipated that she would do well since she has been through the therapy  before.  Hemovac drain was left in place for that.  She did have some bloody  drainage from proximal incision.  It was redressed.  Started partial  weightbearing.  Hemoglobin was 12.3.  Excellent urine output.  On day #2,  the Hemovac drain was removed and dressing was changed.  Incision looked  excellent.  Started getting up with physical therapy and actually ambulated  160 feet.  She did very well and was ready to go home by the following day.   DISCHARGE PLAN:  1.  Patient is discharged home on February 27, 2006.  2.  Discharge diagnoses:  Please see above.  3.  Discharge medications:  Coumadin, Percocet and Robaxin.  4.  Diet as tolerated.  5.  Follow-up in two weeks.  6.  Activity:  Partial weightbearing 25 to 50%.  Home health PT evaluation      one time with home health nursing.   DISPOSITION:  Home.   CONDITION ON DISCHARGE:  Improved.      Alexzandrew L. Julien Girt, P.A.       Ollen Gross, M.D.  Electronically Signed    ALP/MEDQ  D:  04/09/2006  T:  04/10/2006  Job:  [id number]

## 2011-05-10 NOTE — Discharge Summary (Signed)
NAMELAVELLE, BERLAND                 ACCOUNT NO.:  [id number]   MEDICAL RECORD NO.:  [id number]          PATIENT TYPE:  INP   LOCATION:  1608                         FACILITY:  [facility]   PHYSICIAN:  Ollen Gross, M.D.    DATE OF BIRTH:  [birth year]   DATE OF ADMISSION:  09/22/2008  DATE OF DISCHARGE:  09/25/2008                               DISCHARGE SUMMARY   ADMITTING DIAGNOSES:  1. Nonunion right periprosthetic femur fracture.  2. Anxiety.  3. History of pneumonia.  4. Hiatal hernia.  5. Diverticulosis.  6. Chronic hepatitis.  7. Degenerative disk disease.  8. Osteoporosis.  9. History of ARDS (acute respiratory distress syndrome) following a      13-hour back surgery.  10.Postmenopausal.   DISCHARGE DIAGNOSES:  1. Nonunion right periprosthetic femur fracture status post open      reduction and internal fixation, right femur, with revision femoral      component.  2. Mild postop blood loss anemia.  3. Anxiety.  4. History of pneumonia.  5. Hiatal hernia.  6. Diverticulosis.  7. Chronic hepatitis.  8. Degenerative disk disease.  9. Osteoporosis.  10.History of ARDS (acute respiratory distress syndrome) following a      13-hour back surgery.  11.Postmenopausal.   PROCEDURE:  September 22, 2008, open reduction and internal fixation of  right periprostatic femur fracture revision of the right femoral  component of the total hip.  Surgeon, Dr. Lequita Halt.  Assistant, Avel Peace, P.A.-C.  Anesthesia general.   CONSULTS:  None.   BRIEF HISTORY:  [name] is a 60 year old female with a complex history  regarding her right hip and femur.  She has had a right total hip, did  very well.  Unfortunately, a few weeks later had periprosthetic  fracture, distal stem, treated with a long stem and side plate, went on  to a delayed union, had revision of the side plate, the ORIF.  Unfortunately, she has gone on to a nonunion with some varus angulation,  now presents for ORIF of the right  periprosthetic femur fracture.   LABORATORY DATA:  Preop CBC showed hemoglobin 15.2, hematocrit 46.3,  white cell count 10.5, platelets 441.  Postop hemoglobin drifted down to  11.3, then down to 11.  Last noted H and H back up to 11.6 with a crit  of 35.  PT/PTT preop 13.3 and 34 respectively.  INR 1.0.  Serial pro  times followed  and they showed a PT/INR 14.5 and 1.1.  Chem panel on  admission all within normal limits with exception of minimally elevated  ALP of 124.  Serial BMETs were followed.  Sodium did drop from 137 to  134.  Remaining electrolytes remained within normal limits.  Preop UA,  trace leukocyte esterase, 0 to 2 white cells, many bacteria.  Followup  UA showed mild leukocyte esterase, only 3 to 6 white, 3 to 6 red, and  rare bacteria.  Blood group type O+.  Urine culture was negative, no  growth.   EKG, February 28, 2008, normal sinus rhythm.  Normal EKG confirmed by Dr.  Deretha Emory.  Hip films, September 22, 2008, sclerosis __________along the  distal periprosthetic fracture with screw failure of the proximal screw  of the lateral plate and a gap between the distal plate and the adjacent  bone due to varus angulation at the fracture site.   HOSPITAL COURSE:  Patient admitted to [facility].  Taken to  the operating room, underwent the above-stated procedure without  complication.  Patient tolerated the procedure well and later went to  the recovery room on the orthopedic floor.  Started on PCA and p.o.  analgesic pain control following surgery.  Doing pretty well on the  morning of day 1 after a long surgery the night before.  Hemoglobin was  stable at 11.3.  We did order hip abduction brace.  We left the drain in  which was placed at time of surgery.  On day 1, had excellent urinary  output.  Encouraged a pulmonary toilet.  Started getting up out of bed  by day 2.  Brace had been applied.  Dressing change incision looked  good.  Placed on Cipro for borderline UTI  but the culture did prove to  be negative.  Started getting up with therapy.  Walked about 200 feet.  She is doing extremely well with therapy.  Weaned over to p.o. meds and  by postop day #3 on September 25, 2008, hemoglobin was stable 11.6,  afebrile, tolerating meds, walking well, was discharged home.   DISCHARGE PLAN:  1. Patient was discharged home on September 25, 2008.  2. Discharge diagnoses:  Please see above.  3. Discharge meds:  Percocet, Robaxin, and Coumadin.   DIET:  Heart-healthy diet.   ACTIVITY:  Patient non-weightbearing, right leg.  Hip precautions, hip  abduction brace during the day, may have off at night, may have for  showering.  Do not submerge the incision under water.   FOLLOWUP:  Ten to 14 days.   DISPOSITION:  Home.   CONDITION UPON DISCHARGE:  Improved.      Alexzandrew L. Perkins, P.A.C.      Ollen Gross, M.D.  Electronically Signed    ALP/MEDQ  D:  10/25/2008  T:  10/25/2008  Job:  [id number]   cc:   Ollen Gross, M.D.  Fax: [phone]

## 2011-07-12 ENCOUNTER — Inpatient Hospital Stay (HOSPITAL_COMMUNITY): Payer: 59

## 2011-07-12 ENCOUNTER — Inpatient Hospital Stay (HOSPITAL_COMMUNITY)
Admission: AD | Admit: 2011-07-12 | Discharge: 2011-07-14 | DRG: 468 | Disposition: A | Discharge status: Home / Self Care | Payer: 59 | Source: Other Acute Inpatient Hospital | Attending: Orthopedic Surgery | Admitting: Orthopedic Surgery

## 2011-07-12 DIAGNOSIS — K739 Chronic hepatitis, unspecified: Secondary | ICD-10-CM | POA: Diagnosis present

## 2011-07-12 DIAGNOSIS — J449 Chronic obstructive pulmonary disease, unspecified: Secondary | ICD-10-CM | POA: Diagnosis present

## 2011-07-12 DIAGNOSIS — K219 Gastro-esophageal reflux disease without esophagitis: Secondary | ICD-10-CM | POA: Diagnosis present

## 2011-07-12 DIAGNOSIS — R52 Pain, unspecified: Secondary | ICD-10-CM

## 2011-07-12 DIAGNOSIS — F411 Generalized anxiety disorder: Secondary | ICD-10-CM | POA: Diagnosis present

## 2011-07-12 DIAGNOSIS — X58XXXA Exposure to other specified factors, initial encounter: Secondary | ICD-10-CM

## 2011-07-12 DIAGNOSIS — T84029A Dislocation of unspecified internal joint prosthesis, initial encounter: Principal | ICD-10-CM | POA: Diagnosis present

## 2011-07-12 DIAGNOSIS — J4489 Other specified chronic obstructive pulmonary disease: Secondary | ICD-10-CM | POA: Diagnosis present

## 2011-07-12 DIAGNOSIS — Y93E1 Activity, personal bathing and showering: Secondary | ICD-10-CM

## 2011-07-12 DIAGNOSIS — Z96649 Presence of unspecified artificial hip joint: Secondary | ICD-10-CM

## 2011-07-12 LAB — PROTIME-INR: Prothrombin Time: 13.1 seconds (ref 11.6–15.2)

## 2011-07-12 LAB — BASIC METABOLIC PANEL
BUN: 9 mg/dL (ref 6–23)
Calcium: 9.2 mg/dL (ref 8.4–10.5)
Glucose, Bld: 82 mg/dL (ref 70–99)
Potassium: 4.2 mEq/L (ref 3.5–5.1)
Sodium: 135 mEq/L (ref 135–145)

## 2011-07-12 LAB — CBC
MCH: 29.1 pg (ref 26.0–34.0)
MCV: 91.3 fL (ref 78.0–100.0)
Platelets: 369 10*3/uL (ref 150–400)
RBC: 4.7 MIL/uL (ref 3.87–5.11)
RDW: 13.4 % (ref 11.5–15.5)
WBC: 10.8 10*3/uL — ABNORMAL HIGH (ref 4.0–10.5)

## 2011-07-13 LAB — CBC
Hemoglobin: 13.9 g/dL (ref 12.0–15.0)
MCH: 29.3 pg (ref 26.0–34.0)
MCHC: 32.2 g/dL (ref 30.0–36.0)
Platelets: 347 10*3/uL (ref 150–400)
RDW: 13.6 % (ref 11.5–15.5)

## 2011-07-13 LAB — BASIC METABOLIC PANEL
Calcium: 8.9 mg/dL (ref 8.4–10.5)
Potassium: 3.9 mEq/L (ref 3.5–5.1)
Sodium: 134 mEq/L — ABNORMAL LOW (ref 135–145)

## 2011-07-14 LAB — BASIC METABOLIC PANEL
BUN: 9 mg/dL (ref 6–23)
CO2: 25 mEq/L (ref 19–32)
Calcium: 9 mg/dL (ref 8.4–10.5)
Chloride: 104 mEq/L (ref 96–112)
Creatinine, Ser: <0.47 mg/dL — ABNORMAL LOW (ref 0.50–1.10)
Glucose, Bld: 81 mg/dL (ref 70–99)
Potassium: 3.6 mEq/L (ref 3.5–5.1)
Sodium: 138 mEq/L (ref 135–145)

## 2011-07-14 LAB — CBC
HCT: 41.5 % (ref 36.0–46.0)
Hemoglobin: 13.1 g/dL (ref 12.0–15.0)
MCH: 29.4 pg (ref 26.0–34.0)
MCHC: 31.6 g/dL (ref 30.0–36.0)
MCV: 93 fL (ref 78.0–100.0)
Platelets: 374 10*3/uL (ref 150–400)
RBC: 4.46 MIL/uL (ref 3.87–5.11)
RDW: 13.8 % (ref 11.5–15.5)
WBC: 9.7 10*3/uL (ref 4.0–10.5)

## 2011-07-15 NOTE — Op Note (Signed)
NAMETHANA, RAMP                 ACCOUNT NO.:  [id number]  MEDICAL RECORD NO.:  [id number]  LOCATION:  1619                         FACILITY:  [facility]  PHYSICIAN:  Ollen Gross, M.D.    DATE OF BIRTH:  1951-07-06  DATE OF PROCEDURE:  07/12/2011 DATE OF DISCHARGE:                              OPERATIVE REPORT   PREOPERATIVE DIAGNOSIS:  Right hip dislocation.  POSTOPERATIVE DIAGNOSIS:  Right hip dislocation.  PROCEDURE:  Right hip open reduction with acetabular liner revision.  SURGEON:  Ollen Gross, M.D.  ASSISTANT:  Alexzandrew L. Perkins, P.A.C.  ANESTHESIA:  General.  ESTIMATED BLOOD LOSS:  200.  DRAIN:  Hemovac x1.  COMPLICATIONS:  None.  CONDITION.:  Stable to recovery.  BRIEF CLINICAL NOTE:  Caralee is a 60 year old female who has had long complex history in regards to her right hip.  She has had few revisions. She had a constrained liner placed approximately 2 years ago.  She is doing fine.  She had a fall in March but did not appear to injure the hip.  Last night, however, she was bending over awkwardly and dislocated the hip.  [place] to the operating room now for attempted closed reduction and then if that failed open reduction of the hip.  PROCEDURE IN DETAIL:  After successful administration of general anesthetic, I attempted closed reduction under fluoroscopy.  I was able to get the fall back to the liner but the locking ring was still intact. Thus we could not reduce this.  I tried for about 10 minutes and felt that it was futile.  We then decided to do an open reduction.  She was subsequently given 1 g of IV vancomycin.  She was placed in the left lateral decubitus position with the right side up and held with the hip positioner.  Right lower extremity was isolated from perineum with plastic drapes and prepped and draped in usual sterile fashion.  Short posterolateral incision was made with a 10 blade through  subcutaneous tissue to the fascia lata which was incised in line with the skin incision.  Sciatic nerve was palpated and protected and I incised the pseudocapsule into the joint.  She was dislocated posterior and superior.  I was able to remove soft tissue and get to the liner.  The locking ring was intact but there was a portion of the plastic that had broken off from her dislocation.  I attempted to reduce this.  I took the locking ring off and then reduced the hip.  The locking ring would not go back on since the plastic was damaged.  I decided to go ahead and remove the liner and replace it with a new constrained liner.  We subsequently were able to extract the liner from the acetabular shell. The shell was in excellent position and was well fixed.  It was a 15-mm pinnacle acetabular shell.  We then placed the 50 x 28 +4 pinnacle constrained liner.  It was impacted into position.  A new 28 +3 head was placed and the hip reduced.  She really had great stability with this. She was able to be  flexed to 90, 70 degrees of internal rotation and 70 degrees of flexion and almost 90 degrees of internal rotation.  She had full extension and external rotation.  She was not impinging.  We then placed a locking ring over the lip of the liner and locked it into place.  I placed her through range of motion and there was no impingement.  The reduction was stable.  The wound was copiously irrigated with saline solution and the fascia lata tissues reapproximated with interrupted #1 Vicryl.  We placed these over Hemovac drain.  Subcu was closed with 2-0 Vicryl and skin with staples.  The incisions were cleaned and dried.  Drain hooked to suction.  A bulky sterile dressing was applied and she was awakened and transported to recovery in stable condition.     Ollen Gross, M.D.     FA/MEDQ  D:  07/12/2011  T:  07/12/2011  Job:  [id number]  Electronically Signed by Ollen Gross M.D. on 07/15/2011  04:42:24 PM

## 2011-08-05 NOTE — Discharge Summary (Signed)
[name], [name]                 ACCOUNT NO.:  [id number]  MEDICAL RECORD NO.:  [id number]  LOCATION:  1619                         FACILITY:  [facility]  PHYSICIAN:  Ollen Gross, M.D.    DATE OF BIRTH:  1951-01-08  DATE OF ADMISSION:  07/12/2011 DATE OF DISCHARGE:  07/14/2011                              DISCHARGE SUMMARY   ADMISSION DIAGNOSES: 1. Dislocated right total hip arthroplasty. 2. Anxiety. 3. Past history of pneumonia. 4. Hiatal hernia. 5. Diverticulosis. 6. History of chronic hepatitis. 7. Degenerative disk disease. 8. Osteopenia. 9. Past history of acute respiratory distress syndrome, following a 13-     hour back surgery. 10.Postmenopausal.  DISCHARGE DIAGNOSES: 1. Right hip dislocation, status post open reduction of hip with     revision of acetabular liner. 2. Postop hyponatremia. 3. Anxiety. 4. Past history of pneumonia. 5. Hiatal hernia. 6. Diverticulosis. 7. History of chronic hepatitis. 8. Degenerative disk disease. 9. Osteopenia. 10.Past history of acute respiratory distress syndrome, following a 13-     hour back surgery. 11.Postmenopausal.  PROCEDURE:  On July 12, 2011, right hip open reduction with revision of acetabular liner.  Surgeon Dr. Homero Fellers Aleiah Mohammed.  Assistant Alexzandrew Julien Girt, P.A.C.  Anesthesia general.  Blood loss 200 mL.  CONSULTS:  None.  BRIEF HISTORY:  The patient is a 60 year old female, long flexed history regard to her hips.  She has had several revisions with constrained liner placed about 2 years ago.  She was doing fine, but had a fall in March, did not appear to injure the hip, but the evening prior to admission, she was bending and dislocated the hip.  She was rushed here by the cousin, presents for surgery.  LABORATORY DATA:  The admission CBC showed a hemoglobin of 13.7, hematocrit of 42.9, white cell count 10.8, platelets 369.  Postop hemoglobin 13.9, last known H and H were 13.1 with a hematocrit of  41.5. PT/INR preop 13.0 and 0.97.  BMET on admission within normal limits, but the sodium did drop from 135 to 134 back up to 138 on the serial BMETs. Electrolytes remained within normal limits.  X-rays postop hip film show no complications following a right hip surgery.  HOSPITAL COURSE:  The patient was admitted to [facility] after transferred from [facility] on the early morning of July 12, 2011.  She was seen by Dr. Lequita Halt, set up for surgery, taken to OR later that afternoon and underwent the above procedure without complication.  The patient tolerated the procedure well, later was transferred to the recovery room in the orthopedic floor, given 24 hours of postop IV antibiotics.  She was doing pretty well on the morning of day 1.  She was allowed to be weightbearing as tolerated since we just acetabular liner in the head, she did well with her physical therapy, tolerating her meds.  By day 2, she was doing well, walking around the hallways, want to go home.  DISCHARGE/PLAN: 1. Was discharged home on July 14, 2011. 2. Discharge diagnoses, please see above. 3. Discharge meds, daily aspirin, OxyIR, Robaxin, clonazepam,     Neurontin, Prozac, Visine, Xanax.  DIET:  Resume home diet.  ACTIVITY:  Weightbearing as tolerated.  Hip precautions protocol. Follow up in 2 weeks.  DISPOSITION:  Home.  CONDITION ON DISCHARGE:  Improved.     Alexzandrew L. Julien Girt, P.A.C.   ______________________________ Ollen Gross, M.D.    ALP/MEDQ  D:  07/25/2011  T:  07/26/2011  Job:  [id number]  Electronically Signed by Patrica Duel P.A.C. on 08/01/2011 07:40:46 AM Electronically Signed by Ollen Gross M.D. on 08/05/2011 11:37:41 AM

## 2011-08-22 ENCOUNTER — Ambulatory Visit (HOSPITAL_COMMUNITY)
Admission: RE | Admit: 2011-08-22 | Discharge: 2011-08-22 | Disposition: A | Discharge status: Home / Self Care | Payer: 59 | Source: Ambulatory Visit | Attending: Orthopedic Surgery | Admitting: Orthopedic Surgery

## 2011-08-22 DIAGNOSIS — R58 Hemorrhage, not elsewhere classified: Secondary | ICD-10-CM | POA: Insufficient documentation

## 2011-08-22 DIAGNOSIS — Z96649 Presence of unspecified artificial hip joint: Secondary | ICD-10-CM | POA: Insufficient documentation

## 2011-08-22 DIAGNOSIS — J4489 Other specified chronic obstructive pulmonary disease: Secondary | ICD-10-CM | POA: Insufficient documentation

## 2011-08-22 DIAGNOSIS — J449 Chronic obstructive pulmonary disease, unspecified: Secondary | ICD-10-CM | POA: Insufficient documentation

## 2011-08-22 DIAGNOSIS — F341 Dysthymic disorder: Secondary | ICD-10-CM | POA: Insufficient documentation

## 2011-08-22 LAB — URINALYSIS, ROUTINE W REFLEX MICROSCOPIC
Glucose, UA: NEGATIVE mg/dL
Leukocytes, UA: NEGATIVE
Protein, ur: NEGATIVE mg/dL
Specific Gravity, Urine: 1.02 (ref 1.005–1.030)

## 2011-08-22 LAB — COMPREHENSIVE METABOLIC PANEL
AST: 39 U/L — ABNORMAL HIGH (ref 0–37)
Albumin: 3.1 g/dL — ABNORMAL LOW (ref 3.5–5.2)
Calcium: 9.2 mg/dL (ref 8.4–10.5)
Chloride: 100 mEq/L (ref 96–112)
Creatinine, Ser: <0.47 mg/dL — ABNORMAL LOW (ref 0.50–1.10)
Sodium: 135 mEq/L (ref 135–145)
Total Bilirubin: 0.2 mg/dL — ABNORMAL LOW (ref 0.3–1.2)

## 2011-08-22 LAB — CBC
HCT: 41 % (ref 36.0–46.0)
Platelets: 460 10*3/uL — ABNORMAL HIGH (ref 150–400)
RBC: 4.5 MIL/uL (ref 3.87–5.11)
RDW: 14.1 % (ref 11.5–15.5)
WBC: 10.3 10*3/uL (ref 4.0–10.5)

## 2011-08-22 LAB — APTT: aPTT: 38 seconds — ABNORMAL HIGH (ref 24–37)

## 2011-08-22 LAB — PROTIME-INR: INR: 1.05 (ref 0.00–1.49)

## 2011-08-22 LAB — SURGICAL PCR SCREEN: MRSA, PCR: NEGATIVE

## 2011-08-25 LAB — WOUND CULTURE: Culture: NO GROWTH

## 2011-08-27 LAB — ANAEROBIC CULTURE

## 2011-08-28 NOTE — Op Note (Signed)
  [name], Suzanne                 ACCOUNT NO.:  [id number]  MEDICAL RECORD NO.:  [id number]  LOCATION:  DAYL                         FACILITY:  [facility]  PHYSICIAN:  Ollen Gross, M.D.    DATE OF BIRTH:  1951/07/20  DATE OF PROCEDURE:  08/22/2011 DATE OF DISCHARGE:                              OPERATIVE REPORT   PREOPERATIVE DIAGNOSIS:  Right hip hematoma.  POSTOPERATIVE DIAGNOSIS:  Right hip hematoma.  PROCEDURE:  Right hip I and D.  SURGEON:  Ollen Gross, M.D.  ASSISTANT:  Alexzandrew L. Perkins, P.A.C.  ANESTHESIA:  General.  BLOOD LOSS:  Minimal.  DRAIN:  Hemovac x1.  COMPLICATIONS:  None.  CONDITION:  Stable to recovery.  BRIEF CLINICAL NOTE:  [name] is a 60 year old female with long complex history in regards to her right hip.  She had a right hip liner revision, a little over a month ago.  At her initial postop visit, she had a hematoma which was drained.  Past 2 weeks she has had an area that has been seeping from her wound.  She has not had any fevers, chills, or systemic symptoms with this.  She has not had any erythema around the hip.  Given her prior history of infection and given the drainage, I opted to take her to the operating room tonight for a formal irrigation debridement and closure of her drain.  PROCEDURE IN DETAIL:  After successful administration of general anesthetic, the patient was placed in the left lateral decubitus position with the right side up and held with appropriate hip positioner.  Right lower extremity was isolated from perineum with plastic drapes and prepped and draped in the usual sterile fashion. Previous incisions were utilized.  Skin cut with 10 blade through the subcutaneous tissue to the fascia lata.  There was some fibrinous debris just below the fascia lata.  No evidence of any purulence in there. There was some hypertrophic granulation tissue.  I excised that with electrocautery and then meticulously cauterized the  wound bed.  I then inspected into the joint, and it looked clean with no fluid and no purulence or any necrotic tissue.  I then thoroughly irrigated the wound bed with 3 liters of saline using pulsatile lavage.  The tissues looked healthy and normal after that.  I then closed the fascia lata over Hemovac drain with interrupted #1 Vicryl subcu closed with 2-0 Vicryl and the skin closed with running 4-0 nylon. The incision was cleaned and dried and a bulky sterile dressing applied. The drain was hooked to suction.  She was awakened and transported to recovery in stable condition.     Ollen Gross, M.D.     FA/MEDQ  D:  08/22/2011  T:  08/23/2011  Job:  [id number]  Electronically Signed by Ollen Gross M.D. on 08/28/2011 10:04:17 AM

## 2011-09-06 ENCOUNTER — Emergency Department (HOSPITAL_COMMUNITY): Payer: 59

## 2011-09-06 ENCOUNTER — Inpatient Hospital Stay (HOSPITAL_COMMUNITY)
Admission: EM | Admit: 2011-09-06 | Discharge: 2011-09-09 | DRG: 468 | Disposition: A | Discharge status: Home Health | Payer: 59 | Attending: Orthopedic Surgery | Admitting: Orthopedic Surgery

## 2011-09-06 DIAGNOSIS — F411 Generalized anxiety disorder: Secondary | ICD-10-CM | POA: Diagnosis present

## 2011-09-06 DIAGNOSIS — B1920 Unspecified viral hepatitis C without hepatic coma: Secondary | ICD-10-CM | POA: Diagnosis present

## 2011-09-06 DIAGNOSIS — X58XXXA Exposure to other specified factors, initial encounter: Secondary | ICD-10-CM

## 2011-09-06 DIAGNOSIS — M899 Disorder of bone, unspecified: Secondary | ICD-10-CM | POA: Diagnosis present

## 2011-09-06 DIAGNOSIS — Z96649 Presence of unspecified artificial hip joint: Secondary | ICD-10-CM

## 2011-09-06 DIAGNOSIS — IMO0002 Reserved for concepts with insufficient information to code with codable children: Secondary | ICD-10-CM | POA: Diagnosis present

## 2011-09-06 DIAGNOSIS — T84029A Dislocation of unspecified internal joint prosthesis, initial encounter: Principal | ICD-10-CM | POA: Diagnosis present

## 2011-09-06 LAB — POCT I-STAT, CHEM 8
BUN: 14 mg/dL (ref 6–23)
Creatinine, Ser: 0.6 mg/dL (ref 0.50–1.10)
Glucose, Bld: 81 mg/dL (ref 70–99)
Potassium: 4.3 mEq/L (ref 3.5–5.1)
Sodium: 139 mEq/L (ref 135–145)

## 2011-09-06 LAB — CBC
HCT: 40.5 % (ref 36.0–46.0)
Hemoglobin: 12.8 g/dL (ref 12.0–15.0)
MCHC: 31.6 g/dL (ref 30.0–36.0)
RBC: 4.48 MIL/uL (ref 3.87–5.11)

## 2011-09-06 LAB — DIFFERENTIAL
Basophils Absolute: 0 10*3/uL (ref 0.0–0.1)
Basophils Relative: 0 % (ref 0–1)
Lymphocytes Relative: 23 % (ref 12–46)
Monocytes Absolute: 0.8 10*3/uL (ref 0.1–1.0)
Monocytes Relative: 7 % (ref 3–12)
Neutro Abs: 6.8 10*3/uL (ref 1.7–7.7)
Neutrophils Relative %: 57 % (ref 43–77)

## 2011-09-07 LAB — URINALYSIS, ROUTINE W REFLEX MICROSCOPIC
Leukocytes, UA: NEGATIVE
Nitrite: NEGATIVE
Specific Gravity, Urine: 1.016 (ref 1.005–1.030)
Urobilinogen, UA: 0.2 mg/dL (ref 0.0–1.0)
pH: 6 (ref 5.0–8.0)

## 2011-09-07 LAB — PROTIME-INR: INR: 0.99 (ref 0.00–1.49)

## 2011-09-07 LAB — APTT: aPTT: 37 seconds (ref 24–37)

## 2011-09-08 LAB — CBC
Hemoglobin: 11.6 g/dL — ABNORMAL LOW (ref 12.0–15.0)
MCH: 28.5 pg (ref 26.0–34.0)
Platelets: 394 10*3/uL (ref 150–400)
RBC: 4.07 MIL/uL (ref 3.87–5.11)
WBC: 8.7 10*3/uL (ref 4.0–10.5)

## 2011-09-08 LAB — BASIC METABOLIC PANEL
CO2: 26 mEq/L (ref 19–32)
Calcium: 8.5 mg/dL (ref 8.4–10.5)
Chloride: 102 mEq/L (ref 96–112)
Potassium: 3.7 mEq/L (ref 3.5–5.1)
Sodium: 134 mEq/L — ABNORMAL LOW (ref 135–145)

## 2011-09-11 NOTE — Op Note (Signed)
[name], [name]                 ACCOUNT NO.:  [id number]  MEDICAL RECORD NO.:  [id number]  LOCATION:  1613                         FACILITY:  [facility]  PHYSICIAN:  Ollen Gross, M.D.    DATE OF BIRTH:  23-Jun-1951  DATE OF PROCEDURE:  09/07/2011 DATE OF DISCHARGE:                              OPERATIVE REPORT   PREOPERATIVE DIAGNOSIS:  Right hip dislocation.  POSTOPERATIVE DIAGNOSIS:  Right hip dislocation.  PROCEDURE:  Open reduction with acetabular liner exchange.  SURGEON:  Ollen Gross, MD  ASSISTANT:  Alexzandrew L. Julien Girt, PA-C  ANESTHESIA:  General.  ESTIMATED BLOOD LOSS:  100.  DRAIN:  Hemovac x1.  COMPLICATIONS:  None.  CONDITION:  Stable to Recovery.  BRIEF CLINICAL NOTE:  [name] is a 60 year old female with very long complex history in regard to both her hips.  She has a constrained liner in her right hip.  She states that she was sitting on a pontoon boat last night on the backseat and reached down to get her dog and her hip popped out.  [place] [facility] ER.  She presents now for the above-mentioned procedure.  PROCEDURE IN DETAIL:  After successful administration of general anesthetic, the patient was placed in the left lateral decubitus position with the right side up and held with a hip positioner.  Right lower extremity was isolated from perineum with plastic drapes, and prepped and draped in the usual sterile fashion.  Previous posterolateral incisions were utilized.  Skin cut with a 10 blade.  The subcutaneous tissue to the fascia lata was incised in line with the skin incision.  The sciatic nerve was palpated and protected.  The hip was dislocated posterosuperior.  With the help of my assistant whom I would not be able to do this procedure without, was able to reduce the hip down to the acetabular socket.  The locking ring was still intact.  With the locking ring intact, the femoral head would not reduce.  I  removed the locking ring in order to reduce the femoral head.  There was enough damage to the lip of the plastic that the locking ring would not re- engage.  It was felt that I did not want to leave her with a potentially unstable situation.  I tried for at least half hour with the help of my assistant to lock the ring back in place, but the lip of the liner would not allow this to occur.  I decided to remove that and then just upsize her from a 28 to a 32 head.  We then removed the constrained liner from the acetabular shell.  I placed a trial 32-mm neutral plus 4, 10-degree liner with the 10-degree elevated rim on the 11 o'clock position.  I then placed a 32 plus 6 trial head.  The hips reduced with surprisingly incredible stability. She had full extension, full external rotation, 70 degrees of flexion, 40 degrees adduction, 90 degrees internal rotation, and 90 degrees of flexion with 90 degrees of internal rotation.  In fact, we could not dislocate her with range of motion.  It required the use of a bone hook to  actually pull the femoral head out of the socket.  I felt that this would be a good solution for her issue.  We removed the trial head and trial liner.  The permanent 32-mm neutral plus 4, 10-degree marathon liner was then placed with a 10-degree elevation at the 11 o'clock position.  The permanent 32 plus 6 metal head was then placed.  The hip was reduced to the same stability parameters.  Wound was copiously irrigated with saline solution.  A meticulous soft tissue repair was performed back to the femur.  This looked a very tight closure of the joint.  Fascia lata was then closed over Hemovac drain with interrupted #1 Vicryl, subcu closed with #1 and 2-0 Vicryl, and skin closed with a running baseball-type stitch with 4-0 nylon.  The drain was hooked to suction.  Incision was cleaned and dried, and a bulky sterile dressing applied.  She will be placed into a hip abduction  orthosis for the next 6 to 8 weeks to allow the tissues to heal.  She was transported to Recovery in stable condition.  Please note that the use of a surgical assistant was a medical necessity to safely and expeditiously perform this procedure.  The medical assistant was absolutely necessary for positioning of the leg, reduction of the hip, and safe performance of this operation.     Ollen Gross, M.D.     FA/MEDQ  D:  09/07/2011  T:  09/07/2011  Job:  [id number]  Electronically Signed by Ollen Gross M.D. on 09/11/2011 10:09:01 AM

## 2011-09-12 LAB — CBC
HCT: 25.8 — ABNORMAL LOW
HCT: 27.6 — ABNORMAL LOW
Hemoglobin: 14.7
Hemoglobin: 8.8 — ABNORMAL LOW
Hemoglobin: 9 — ABNORMAL LOW
Hemoglobin: 9.6 — ABNORMAL LOW
MCHC: 35.2
MCV: 91.2
Platelets: 322
RBC: 4.6
RBC: 2.71 — ABNORMAL LOW
RDW: 12.6
WBC: 10.4
WBC: 6.5

## 2011-09-12 LAB — COMPREHENSIVE METABOLIC PANEL
ALT: 15
AST: 21
CO2: 28
Chloride: 105
Creatinine, Ser: 0.51
GFR calc Af Amer: >60
GFR calc non Af Amer: >60
Glucose, Bld: 85
Total Bilirubin: 0.8

## 2011-09-12 LAB — BASIC METABOLIC PANEL
CO2: 29
Calcium: 8.2 — ABNORMAL LOW
Chloride: 101
GFR calc Af Amer: >60
GFR calc Af Amer: >60
GFR calc non Af Amer: >60
Potassium: 4
Sodium: 133 — ABNORMAL LOW
Sodium: 138

## 2011-09-12 LAB — PROTIME-INR: INR: 1.4

## 2011-09-12 LAB — URINALYSIS, ROUTINE W REFLEX MICROSCOPIC
Nitrite: NEGATIVE
Specific Gravity, Urine: 1.011
pH: 5.5

## 2011-09-12 LAB — TYPE AND SCREEN: DAT, IgG: NEGATIVE

## 2011-09-16 LAB — CBC
HCT: 25.7 — ABNORMAL LOW
HCT: 27.4 — ABNORMAL LOW
HCT: 31.3 — ABNORMAL LOW
HCT: 36.2
Hemoglobin: 10.9 — ABNORMAL LOW
Hemoglobin: 12.3
Hemoglobin: 8.9 — ABNORMAL LOW
Hemoglobin: 9.5 — ABNORMAL LOW
MCHC: 34.1
MCHC: 34.6
MCV: 89.9
MCV: 92
RBC: 2.84 — ABNORMAL LOW
RBC: 3.05 — ABNORMAL LOW
RBC: 3.53 — ABNORMAL LOW
RBC: 3.94
WBC: 13 — ABNORMAL HIGH
WBC: 7.2

## 2011-09-16 LAB — BASIC METABOLIC PANEL
CO2: 26
Calcium: 7.7 — ABNORMAL LOW
Chloride: 104
Chloride: 106
Creatinine, Ser: 0.49
Creatinine, Ser: 0.57
GFR calc Af Amer: >60
GFR calc Af Amer: >60
GFR calc Af Amer: >60
GFR calc non Af Amer: >60
Potassium: 3.7
Potassium: 3.9
Potassium: 3.9
Sodium: 134 — ABNORMAL LOW
Sodium: 137
Sodium: 138

## 2011-09-16 LAB — DIFFERENTIAL
Basophils Relative: 1
Eosinophils Absolute: 0.7
Monocytes Absolute: 0.7
Monocytes Relative: 7

## 2011-09-16 LAB — CROSSMATCH
Antibody Screen: NEGATIVE
Donor AG Type: NEGATIVE
Donor AG Type: NEGATIVE

## 2011-09-16 LAB — PROTIME-INR
INR: 1.2
INR: 1.3
Prothrombin Time: 17.3 — ABNORMAL HIGH

## 2011-09-19 LAB — TYPE AND SCREEN: Antibody Screen: NEGATIVE

## 2011-09-19 LAB — URINALYSIS, ROUTINE W REFLEX MICROSCOPIC
Bilirubin Urine: NEGATIVE
Glucose, UA: NEGATIVE
Nitrite: NEGATIVE
Specific Gravity, Urine: 1.014
pH: 6

## 2011-09-19 LAB — COMPREHENSIVE METABOLIC PANEL
ALT: 17
AST: 19
Albumin: 3.4 — ABNORMAL LOW
Alkaline Phosphatase: 142 — ABNORMAL HIGH
Potassium: 3.9
Sodium: 139
Total Protein: 6.3

## 2011-09-19 LAB — CBC
Hemoglobin: 13.8
Platelets: 466 — ABNORMAL HIGH
RDW: 15

## 2011-09-19 LAB — URINE MICROSCOPIC-ADD ON

## 2011-09-23 LAB — URINALYSIS, ROUTINE W REFLEX MICROSCOPIC
Bilirubin Urine: NEGATIVE
Bilirubin Urine: NEGATIVE
Glucose, UA: NEGATIVE
Glucose, UA: NEGATIVE
Hgb urine dipstick: NEGATIVE
Protein, ur: NEGATIVE
Specific Gravity, Urine: 1.007
Urobilinogen, UA: 0.2
pH: 7

## 2011-09-23 LAB — CBC
HCT: 46.3 — ABNORMAL HIGH
HCT: 34 — ABNORMAL LOW
Hemoglobin: 11 — ABNORMAL LOW
Hemoglobin: 11.6 — ABNORMAL LOW
MCHC: 32.8
MCV: 92.2
MCV: 90.8
MCV: 91.4
Platelets: 441 — ABNORMAL HIGH
RBC: 3.62 — ABNORMAL LOW
RBC: 3.72 — ABNORMAL LOW
RBC: 3.86 — ABNORMAL LOW
RDW: 14.2
WBC: 10.5
WBC: 10.2
WBC: 11.1 — ABNORMAL HIGH
WBC: 8.7

## 2011-09-23 LAB — BASIC METABOLIC PANEL
Calcium: 8.1 — ABNORMAL LOW
Chloride: 103
Creatinine, Ser: 0.58
GFR calc Af Amer: >60
GFR calc Af Amer: >60
GFR calc non Af Amer: >60
GFR calc non Af Amer: >60
Potassium: 4
Sodium: 134 — ABNORMAL LOW
Sodium: 135

## 2011-09-23 LAB — URINE CULTURE
Colony Count: NO GROWTH
Culture: NO GROWTH
Special Requests: NEGATIVE

## 2011-09-23 LAB — TYPE AND SCREEN: Donor AG Type: NEGATIVE

## 2011-09-23 LAB — COMPREHENSIVE METABOLIC PANEL
Albumin: 3.7
BUN: 9
Chloride: 100
Creatinine, Ser: 0.6
Total Bilirubin: 0.7

## 2011-09-23 LAB — PROTIME-INR
INR: 1
INR: 1.1
INR: 1.1
INR: 1.2
Prothrombin Time: 13.3
Prothrombin Time: 14.5
Prothrombin Time: 14.5
Prothrombin Time: 15.5 — ABNORMAL HIGH

## 2011-09-23 LAB — URINE MICROSCOPIC-ADD ON

## 2011-09-23 LAB — APTT: aPTT: 34

## 2011-10-02 NOTE — Discharge Summary (Signed)
[name], [name]                 ACCOUNT NO.:  [id number]  MEDICAL RECORD NO.:  [id number]  LOCATION:  1613                         FACILITY:  [facility]  PHYSICIAN:  Ollen Gross, M.D.    DATE OF BIRTH:  [birth year]  DATE OF ADMISSION:  09/06/2011 DATE OF DISCHARGE:  09/09/2011                              DISCHARGE SUMMARY   ADMITTING DIAGNOSES: 1. 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 2. Anxiety. 3. History of pneumonia. 4. Hiatal hernia. 5. Diverticulosis. 6. Chronic hepatitis. 7. Degenerative disk disease. 8. Osteopenia. 9. Past history of acute respiratory distress syndrome following a     previous back surgery. 10.Postmenopausal.  DISCHARGE DIAGNOSES: 1. Right hip dislocation, status post open reduction acetabular liner     exchange. 2. Anxiety. 3. History of pneumonia. 4. Hiatal hernia. 5. Diverticulosis. 6. Chronic hepatitis. 7. Degenerative disk disease. 8. Osteopenia. 9. Past history of acute respiratory distress syndrome following a     previous back surgery. 10.Postmenopausal. 11.Postop hyponatremia.  PROCEDURE:  September 07, 2011, open reduction with acetabular liner exchange.  Surgeon, Dr. Lequita Halt.  Assistant, Patrica Duel, PA-C. Anesthesia general.  CONSULTS:  None.  BRIEF HISTORY:  [name] is a 60 year old female with long complex history with regard to both of her hips.  She has had a constrained liner placed in the right hip.  She was sitting on a pontoon boat the night prior to admission on the back seat, reached down to pick up her dog, and her hip popped out.  [place] [facility] where she was admitted for surgery.  LABORATORY DATA:  The CBC on admission showed hemoglobin 12.8, hematocrit 40.5, white cell count 11.8; followup hemoglobin 11.6 with a hematocrit of 37.2, platelets were high on the admission CBC of 427, postop it was 394.  PT/INR preop 13.3 and 0.99 with a PTT of 37. Electrolytes  preop were normal electrolytes; electrolytes postop showed drop in sodium down to 134 from 139. Admission urinalysis was negative.  HOSPITAL COURSE:  The patient was admitted to [facility] on the early morning hours of September 07, 2011, for the above-stated issue.  She was preoped and taken to the operating room later that evening and underwent the above-stated procedure without complication. The patient tolerated the procedure well, later transferred to recovery room and then to the orthopedic floor.  She was given 24 hours postop IV antibiotics.  On day #1, she was doing fairly well.  Pain was under little bit better control.  She was sore to the hip.  We put her on Xarelto for DVT prophylaxis.  Started working with therapy and by day #2, she was doing better.  We discussed her surgical findings with Dr. Lequita Halt.  We had ordered abduction brace for her to wear until things healed in.  She met her goals with therapy and discharged home on September 09, 2011.  DISCHARGE PLAN: 1. Discharge diagnoses, please see above. 2. Discharge medications, Robaxin, OxyIR, Xarelto, clonazepam,     doxycycline, Neurontin, Prozac, Tylenol, guaifenesin, Visine,     Xanax. 3. Diet, as tolerated. 4. Activity, she can be weightbearing as tolerated, total hip  protocol, wear abduction brace at all times except in bed.  She may     have it off all while she is sleeping, otherwise wear the brace at     all times. 5. Follow up in 2 weeks.  DISPOSITION:  Home.  CONDITION UP ON DISCHARGE:  Slowly improving.     Alexzandrew L. Julien Girt, P.A.C.   ______________________________ Ollen Gross, M.D.    ALP/MEDQ  D:  09/19/2011  T:  09/19/2011  Job:  [id number]  Electronically Signed by Patrica Duel P.A.C. on 09/23/2011 04:30:06 PM Electronically Signed by Ollen Gross M.D. on 10/02/2011 11:16:10 AM

## 2011-11-16 ENCOUNTER — Other Ambulatory Visit: Payer: Self-pay | Admitting: Orthopedic Surgery

## 2011-11-26 ENCOUNTER — Other Ambulatory Visit: Payer: Self-pay | Admitting: Orthopedic Surgery

## 2011-11-26 NOTE — H&P (Signed)
[name]  DOB: [birth year]  Date of Admission:  12/04/2011  Chief Complaint:  Right Hip Pain  History of Present Illness The patient is a 60 year old female who comes in today for a preoperative History and Physical. The patient is scheduled for a Right Total Hip Revision vs Acetabular Revision to be performed by Dr. Gus Rankin. Aluisio, MD at [facility] on 12/04/2011. The patient is status post right total hip arthroplasty (acetabular revision). The patient states that she is not doing well at this time. The patient feels that they are progressing poorly at this time. She has dislocated twice since her last visit here. She was seen at [facility] and had the hip reduced there. She has unfortunately had two dislocations. In the past, she had a few dislocations reaching for her dog. She states that she had an atraumatic reduction the first time but had a lot of trauma with the reduction the second time. The patient is not having a tremendous amount of groin pain but does have lateral pain from where they pulled on her hip. It is felt that she would benefit from undergoing revision surgery. Risks and benefits have been discussed with the patient and they elect to proceed with surgery. There are no current contradindications to the procedure such as ongoing infection or progressive neurological disease.  Allergies AUGMENTIN. 02/07/2006 Vomiting.  Problem List/Past Medical Hiatal Hernia Degenerative Disc Disease Menopause Osteoporosis Osteoarthritis Pneumonia Diverticulitis Of Colon History of ARDS (acute respiratory distress syndrome) following prev. back surgery (11 hours) Anxiety Disorder Chronic Hepatitis  Family History Osteoarthritis. mother Osteoporosis. mother Father. Deceased, Chronic Renal Failure Syndrome, Colon Cancer, Heart disease. Age 69, Was on dialysis. Bladder Cancer. Mother. Deceased, Postmenopausal Osteoporosis. Age 36, peforated  colon.  Social History Alcohol use. Occasional alcohol use. current drinker; drinks wine; only occasionally per week Children. 1 Current work status. working part time Exercise. Exercises never Living situation. live with spouse Marital status. married Number of flights of stairs before winded. 2-3 Tobacco use. Current every day smoker. current every day smoker; smoke(d) 1 pack(s) per day Post-Surgical Plans. Plan to go Home. Advance Directives. Living Will and Healthcare POA  Medication History Percocet ( Oral) Specific dose unknown - Active. Gabapentin (800MG  Tablet, Oral) Active. ClonazePAM (0.5MG  Tablet, Oral) Active. ALPRAZolam (0.5MG  Tablet, Oral) Active. PROzac (20MG  Capsule, Oral) Active. Omeprazole (20MG  Capsule DR, Oral) Active.  Past Surgical History Arthroscopy of Knee. right Breast Mass; Local Excision. left Carpal Tunnel Repair. bilateral Spinal Fusion. lower back Spinal Surgery Tonsillectomy Total Hip Replacement. bilateral  Review of Systems General:Present- Night Sweats. Not Present- Chills, Fever, Appetite Loss, Fatigue, Feeling sick, Weight Gain and Weight Loss. Skin:Not Present- Itching, Rash, Skin Color Changes, Ulcer, Psoriasis and Change in Hair or Nails. HEENT:Present- Ringing in the Ears. Not Present- Sensitivity to light, Hearing problems and Nose Bleed. Neck:Not Present- Swollen Glands and Neck Mass. Respiratory:Not Present- Snoring, Chronic Cough, Bloody sputum and Dyspnea. Cardiovascular:Not Present- Shortness of Breath, Chest Pain, Swelling of Extremities, Leg Cramps and Palpitations. Gastrointestinal:Not Present- Bloody Stool, Heartburn, Abdominal Pain, Vomiting, Nausea and Incontinence of Stool. Female Genitourinary:Not Present- Blood in Urine, Menstrual Irregularities, Frequency, Incontinence and Nocturia. Musculoskeletal:Present- Muscle Weakness, Muscle Pain, Joint Stiffness, Joint Pain, Back Pain and Spasms. Not  Present- Joint Swelling. Neurological:Not Present- Tingling, Numbness, Burning, Tremor, Headaches and Dizziness. Psychiatric:Not Present- Anxiety, Depression and Memory Loss. Endocrine:Not Present- Cold Intolerance, Heat Intolerance, Excessive hunger and Excessive Thirst. Hematology:Not Present- Abnormal Bleeding, Anemia, Blood Clots and Easy Bruising.  Vitals 11/26/2011 3:18 PM Weight: 110 lb Height: 61 in Body Surface Area: 1.47 m Body Mass Index: 20.78 kg/m Pulse: 92 (Regular) Resp.: 16 (Unlabored) BP: 118/76 (Sitting, Right Arm, Standard)  Physical Exam The physical exam findings are as follows:  Patient is a 60 year old white, petite framed female with continued right hip problems.  General Mental Status - Alert, cooperative and good historian. General Appearance- pleasant. Not in acute distress. Orientation- Oriented X3. Build & Nutrition- Lean, [place], Well nourished and Well developed.  Head and Neck Head- normocephalic, atraumatic . Neck Global Assessment- supple. no bruit auscultated on the right and no bruit auscultated on the left.  Eye Pupil- Bilateral- Regular and Round. Motion- Bilateral- EOMI.  Chest and Lung Exam Auscultation: Breath sounds:- clear at anterior chest wall and - clear at posterior chest wall. Adventitious sounds:- No Adventitious sounds.  Cardiovascular Auscultation:Rhythm- Regular rate and rhythm. Heart Sounds- S1 WNL and S2 WNL. Murmurs & Other Heart Sounds: Murmur 1:Location- Aortic Area and Sternal Border - Left. Timing- Holosystolic. Grade- III/VI.  Abdomen Palpation/Percussion:Palpation and Percussion of the abdomen reveal - Non Tender and Soft. Note: Flat Rigidity (guarding)- Abdomen is soft. Auscultation:Auscultation of the abdomen reveals - Bowel sounds normal.  Female Genitourinary Note: Not done, not pertinent to present illness  Musculoskeletal On examination, well-developed  female, alert and oriented, in no apparent distress. There is moderate swelling around the buttock and the incision area. There is no warmth. There is no drainage.  RADIOGRAPHS: Radiographs show concentric reduction of the hip.  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 Acetaular Revision per Dr. Lequita Halt.  Plan to go home after hopsital stay.  Avel Peace, PA-C

## 2011-11-28 ENCOUNTER — Encounter (HOSPITAL_COMMUNITY): Payer: Self-pay | Admitting: Pharmacy Technician

## 2011-11-29 ENCOUNTER — Encounter: Payer: Self-pay | Admitting: Emergency Medicine

## 2011-11-29 ENCOUNTER — Emergency Department (HOSPITAL_COMMUNITY): Payer: 59

## 2011-11-29 ENCOUNTER — Ambulatory Visit (HOSPITAL_COMMUNITY)
Admission: EM | Admit: 2011-11-29 | Discharge: 2011-11-30 | Disposition: A | Discharge status: Home / Self Care | Payer: 59 | Attending: Emergency Medicine | Admitting: Emergency Medicine

## 2011-11-29 DIAGNOSIS — X500XXA Overexertion from strenuous movement or load, initial encounter: Secondary | ICD-10-CM | POA: Insufficient documentation

## 2011-11-29 DIAGNOSIS — Z79899 Other long term (current) drug therapy: Secondary | ICD-10-CM | POA: Insufficient documentation

## 2011-11-29 DIAGNOSIS — Z96649 Presence of unspecified artificial hip joint: Secondary | ICD-10-CM | POA: Insufficient documentation

## 2011-11-29 DIAGNOSIS — F411 Generalized anxiety disorder: Secondary | ICD-10-CM | POA: Insufficient documentation

## 2011-11-29 DIAGNOSIS — S73004A Unspecified dislocation of right hip, initial encounter: Secondary | ICD-10-CM

## 2011-11-29 DIAGNOSIS — K449 Diaphragmatic hernia without obstruction or gangrene: Secondary | ICD-10-CM | POA: Insufficient documentation

## 2011-11-29 DIAGNOSIS — M25559 Pain in unspecified hip: Secondary | ICD-10-CM | POA: Insufficient documentation

## 2011-11-29 DIAGNOSIS — M810 Age-related osteoporosis without current pathological fracture: Secondary | ICD-10-CM | POA: Insufficient documentation

## 2011-11-29 DIAGNOSIS — T84029A Dislocation of unspecified internal joint prosthesis, initial encounter: Secondary | ICD-10-CM | POA: Insufficient documentation

## 2011-11-29 MED ORDER — FENTANYL CITRATE 0.05 MG/ML IJ SOLN
50.0000 ug | Freq: Once | INTRAMUSCULAR | Status: AC
  Administered 2011-11-29: 20:00:00 via INTRAVENOUS

## 2011-11-29 MED ORDER — PROPOFOL 10 MG/ML IV BOLUS
INTRAVENOUS | Status: AC | PRN
  Administered 2011-11-29: 25 mg via INTRAVENOUS
  Administered 2011-11-29: 50 mg via INTRAVENOUS
  Administered 2011-11-29 (×2): 25 mg via INTRAVENOUS

## 2011-11-29 MED ORDER — FENTANYL CITRATE 0.05 MG/ML IJ SOLN: 50.0000 ug | Freq: Once | INTRAMUSCULAR | Status: DC

## 2011-11-29 MED ORDER — HYDROMORPHONE HCL PF 1 MG/ML IJ SOLN
1.0000 mg | Freq: Once | INTRAMUSCULAR | Status: AC
  Administered 2011-11-29: 1 mg via INTRAVENOUS
  Filled 2011-11-29: qty 1

## 2011-11-29 MED ORDER — FENTANYL CITRATE 0.05 MG/ML IJ SOLN
INTRAMUSCULAR | Status: AC
  Filled 2011-11-29: qty 2

## 2011-11-29 MED ORDER — PROPOFOL 10 MG/ML IV EMUL
5.0000 ug/kg/min | INTRAVENOUS | Status: DC
Start: 2011-11-29 — End: 2011-11-29
  Filled 2011-11-29: qty 20

## 2011-11-29 NOTE — ED Notes (Signed)
Pt does not want blood drawn. RN Dareen Piano

## 2011-11-29 NOTE — ED Notes (Signed)
MD at bedside. 

## 2011-11-29 NOTE — ED Notes (Signed)
Patient transported to X-ray 

## 2011-11-29 NOTE — ED Notes (Signed)
Pt was sitting on stool and reached for something and felt Right hip dislocate; Pt reports numerous past hip dislocations as well as back and hip surgeries; pt report right hip is a striker that was to be replaced on Wednesday by MD Alusio; pt vss, in severe pain

## 2011-11-29 NOTE — ED Provider Notes (Signed)
History     CSN: [id number] Arrival date & time: 11/29/2011  7:53 PM   First MD Initiated Contact with Patient 11/29/11 2004      Chief Complaint  Patient presents with  . Hip Pain    (Consider location/radiation/quality/duration/timing/severity/associated sxs/prior treatment) Patient is a 60 y.o. female presenting with hip pain. The history is provided by the patient.  Hip Pain   patient reports her history of recurrent right hip dislocations and for this she sees Dr. Silvestre Gunner.  She reports she is scheduled to have surgery in 5 days for replacement of her right hip until this evening when she developed acute pain after reaching for a napkin.  She reports no numbness and tingling in her right leg.  She reports the pain is similar to her prior hip dislocations.  She received pain medicine in route but continues to have severe pain at this time.  His pain is constant and is worsened by movement of her right hip.  Her pain is improved by nothing.  Past Medical History  Diagnosis Date  . Osteoporosis     Past Surgical History  Procedure Date  . Hip surgery   . Back surgery   . Joint replacement   . Knee surgery     History reviewed. No pertinent family history.  History  Substance Use Topics  . Smoking status: Current Some Day Smoker  . Smokeless tobacco: Not on file  . Alcohol Use: Yes     occassional    OB History    Grav Para Term Preterm Abortions TAB SAB Ect Mult Living                  Review of Systems  All other systems reviewed and are negative.    Allergies  Augmentin  Home Medications   Current Outpatient Rx  Name Route Sig Dispense Refill  . ALPRAZOLAM 1 MG PO TABS Oral Take 2 mg by mouth at bedtime.      Marland Kitchen CALCIUM + D PO Oral Take 1 tablet by mouth 2 (two) times daily.      Marland Kitchen CLONAZEPAM 0.5 MG PO TABS Oral Take 0.5 mg by mouth at bedtime.      . OMEGA-3 FATTY ACIDS 1000 MG PO CAPS Oral Take 1 g by mouth daily.      Marland Kitchen FLUOXETINE HCL 20 MG  PO CAPS Oral Take 40 mg by mouth every morning.      Marland Kitchen GABAPENTIN 800 MG PO TABS Oral Take 800 mg by mouth 4 (four) times daily.      Carma Leaven M PLUS PO TABS Oral Take 1 tablet by mouth daily.      Marland Kitchen OMEPRAZOLE 20 MG PO CPDR Oral Take 20 mg by mouth daily.      . OXYCODONE-ACETAMINOPHEN 10-325 MG PO TABS Oral Take 1 tablet by mouth 4 (four) times daily as needed. Pain      . VITAMIN D (CHOLECALCIFEROL) PO Oral Take 1 tablet by mouth daily.        BP 108/69  Pulse 87  Temp(Src) 97 F (36.1 C) (Oral)  Resp 18  Ht 5\' 1"  (1.549 m)  Wt 110 lb (49.896 kg)  BMI 20.78 kg/m2  SpO2 94%  Physical Exam  Nursing note and vitals reviewed. Constitutional: She is oriented to person, place, and time. She appears well-developed and well-nourished. No distress.  HENT:  Head: Normocephalic and atraumatic.  Eyes: EOM are normal.  Neck: Normal range of motion.  Cardiovascular: Normal rate, regular rhythm and normal heart sounds.   Pulmonary/Chest: Effort normal and breath sounds normal.  Abdominal: Soft. She exhibits no distension. There is no tenderness.  Musculoskeletal:       Deformity of right hip with pain with range of motion.  Normal PT and DP pulses in the right foot  Neurological: She is alert and oriented to person, place, and time.  Skin: Skin is warm and dry.  Psychiatric: She has a normal mood and affect. Judgment normal.    ED Course  Procedural sedation Performed by: Lyanne Co Authorized by: Lyanne Co Consent: Verbal consent obtained. Risks and benefits: risks, benefits and alternatives were discussed Consent given by: patient and spouse Required items: required blood products, implants, devices, and special equipment available Patient identity confirmed: verbally with patient and arm band Time out: Immediately prior to procedure a "time out" was called to verify the correct patient, procedure, equipment, support staff and site/side marked as required. Patient  sedated: yes Sedatives: propofol Analgesia: hydromorphone Vitals: Vital signs were monitored during sedation. Patient tolerance: Patient tolerated the procedure well with no immediate complications. Comments: Total sedation time, 15 minutes.   Reduction of dislocation Performed by: Lyanne Co Authorized by: Lyanne Co Consent: Verbal consent obtained. Written consent obtained. Risks and benefits: risks, benefits and alternatives were discussed Consent given by: patient and spouse Required items: required blood products, implants, devices, and special equipment available Patient identity confirmed: verbally with patient and arm band Time out: Immediately prior to procedure a "time out" was called to verify the correct patient, procedure, equipment, support staff and site/side marked as required. Patient sedated: yes (See note) Patient tolerance: Patient tolerated the procedure well with no immediate complications. Comments: Hyperflexion of right hip and traction applied several times without successful reduction.     Labs Reviewed  CBC  BASIC METABOLIC PANEL  PROTIME-INR   Dg Hip Complete Right  11/29/2011  *RADIOLOGY REPORT*  Clinical Data: Right hip pain, right hip replacement  RIGHT HIP - COMPLETE 2+ VIEW  Comparison: 09/07/2011  Findings: Right total hip arthroplasty with posterior dislocation of the femoral component.  Left total hip arthroplasty without evidence of hardware complication.  Posterior fixation with ray cage fusion of the lower lumbar spine.  No fracture or dislocation is seen.  IMPRESSION: Right total hip arthroplasty with posterior dislocation of the femoral component.  Left total hip arthroplasty without evidence of hardware complication.  Original Report Authenticated By: Charline Bills, M.D.      1. Right prosthetic hip dislocation    MDM  Will treat pain and obtain x-rays at this time.  The patient's goal is to have her right hip relocated and  to be discharged home  Spoke with Dr Charlann Boxer regarding the pt who requests that I attempt sedation and reduction at the bedside which I told him I was happy to do.   11:26 PM Unable to reduce with propofol sedation at the bedside. Spoke with Dr Charlann Boxer who will evaluate the pt in the ER.        Lyanne Co, MD 11/29/11 [id number]

## 2011-11-30 ENCOUNTER — Encounter (HOSPITAL_COMMUNITY)
Admission: EM | Disposition: A | Discharge status: Home / Self Care | Payer: Self-pay | Source: Home / Self Care | Attending: Emergency Medicine

## 2011-11-30 ENCOUNTER — Emergency Department (HOSPITAL_COMMUNITY): Payer: 59

## 2011-11-30 ENCOUNTER — Encounter (HOSPITAL_COMMUNITY): Payer: Self-pay | Admitting: Anesthesiology

## 2011-11-30 ENCOUNTER — Emergency Department (HOSPITAL_COMMUNITY): Payer: 59 | Admitting: Anesthesiology

## 2011-11-30 HISTORY — PX: HIP CLOSED REDUCTION: SHX983

## 2011-11-30 LAB — POCT I-STAT, CHEM 8
BUN: 10 mg/dL (ref 6–23)
Chloride: 107 mEq/L (ref 96–112)
Creatinine, Ser: 0.6 mg/dL (ref 0.50–1.10)
Potassium: 4.1 mEq/L (ref 3.5–5.1)
Sodium: 141 mEq/L (ref 135–145)
TCO2: 25 mmol/L (ref 0–100)

## 2011-11-30 LAB — CBC
Platelets: 448 10*3/uL — ABNORMAL HIGH (ref 150–400)
RBC: 4.36 MIL/uL (ref 3.87–5.11)
RDW: 14.3 % (ref 11.5–15.5)
WBC: 10.7 10*3/uL — ABNORMAL HIGH (ref 4.0–10.5)

## 2011-11-30 LAB — BASIC METABOLIC PANEL
CO2: 24 mEq/L (ref 19–32)
Chloride: 106 mEq/L (ref 96–112)
Creatinine, Ser: 0.36 mg/dL — ABNORMAL LOW (ref 0.50–1.10)
GFR calc Af Amer: >90 mL/min (ref >90)
Potassium: 4.1 mEq/L (ref 3.5–5.1)
Sodium: 137 mEq/L (ref 135–145)

## 2011-11-30 SURGERY — CLOSED REDUCTION, HIP
Anesthesia: General | Site: Hip | Laterality: Right | Wound class: Clean

## 2011-11-30 MED ORDER — FENTANYL CITRATE 0.05 MG/ML IJ SOLN: 25.0000 ug | INTRAMUSCULAR | Status: DC | PRN

## 2011-11-30 MED ORDER — SUCCINYLCHOLINE CHLORIDE 20 MG/ML IJ SOLN
INTRAMUSCULAR | Status: DC | PRN
  Administered 2011-11-30: 40 mg via INTRAVENOUS

## 2011-11-30 MED ORDER — FENTANYL CITRATE 0.05 MG/ML IJ SOLN
INTRAMUSCULAR | Status: DC | PRN
  Administered 2011-11-30: 50 ug via INTRAVENOUS

## 2011-11-30 MED ORDER — SODIUM CHLORIDE 0.9 % IV SOLN
INTRAVENOUS | Status: DC | PRN
  Administered 2011-11-30: 01:00:00 via INTRAVENOUS

## 2011-11-30 MED ORDER — PROMETHAZINE HCL 25 MG/ML IJ SOLN: 6.2500 mg | INTRAMUSCULAR | Status: DC | PRN

## 2011-11-30 MED ORDER — FENTANYL CITRATE 0.05 MG/ML IJ SOLN
INTRAMUSCULAR | Status: AC
  Filled 2011-11-30: qty 2

## 2011-11-30 MED ORDER — SODIUM CHLORIDE 0.9 % IV SOLN: INTRAVENOUS | Status: DC

## 2011-11-30 MED ORDER — PROPOFOL 10 MG/ML IV EMUL
INTRAVENOUS | Status: DC | PRN
  Administered 2011-11-30: 10 mL via INTRAVENOUS

## 2011-11-30 SURGICAL SUPPLY — 3 items
IMMOBILIZER KNEE 16 UNIV (MISCELLANEOUS) ×2 IMPLANT
IMMOBILIZER KNEE 20 (SOFTGOODS) ×2
IMMOBILIZER KNEE 20 THIGH 36 (SOFTGOODS) ×1 IMPLANT

## 2011-11-30 NOTE — Consult Note (Signed)
Reason for Consult:Dislocated Right THR Referring Physician: [name] is an 60 y.o. female.  HPI: The patient is a 60 year old female who comes in today for a preoperative History and Physical. The patient is scheduled for a Right Total Hip Revision vs Acetabular Revision to be performed by Dr. Gus Rankin. Aluisio, MD at [facility] on 12/04/2011. The patient is status post right total hip arthroplasty (acetabular revision). She unfortunately reached to her right side from low bench.  She felt her hip dislocate again.  She was brought to the ER where they attempted to reduce the hip under propofol anesthetic.  They were unsuccessful.  We were consulted for closed reduction. Risks and benefits were discussed with the patient and they elect to proceed with surgery. There are no current contradindications to the procedure such as ongoing infection or progressive neurological disease.   Past Medical History  Diagnosis Date  . Osteoporosis   Problem List/Past Medical  Hiatal Hernia  Degenerative Disc Disease  Menopause  Osteoporosis  Osteoarthritis  Pneumonia  Diverticulitis Of Colon  History of ARDS (acute respiratory distress syndrome) following prev. back surgery (11 hours)  Anxiety Disorder  Chronic Hepatitis   Past Surgical History  Procedure Date  . Hip surgery   . Back surgery   . Joint replacement   . Knee surgery     History reviewed. No pertinent family history.  Social History:  reports that she has been smoking.  She does not have any smokeless tobacco history on file. She reports that she drinks alcohol. She reports that she does not use illicit drugs.  Allergies:  Allergies  Allergen Reactions  . Augmentin (Amoxicillin-Pot Clavulanate) Nausea And Vomiting    Medications: I have reviewed the patient's current medications.  Percocet ( Oral) Specific dose unknown - Active.  Gabapentin (800MG  Tablet, Oral) Active.  ClonazePAM (0.5MG  Tablet,  Oral) Active.  ALPRAZolam (0.5MG  Tablet, Oral) Active.  PROzac (20MG  Capsule, Oral) Active.  Omeprazole (20MG  Capsule DR, Oral) Acti  No results found for this or any previous visit (from the past 24 hour(s)).  ROS:  No recent fever chills night sweats, no cough wheezing, no chest pain, no abdominal pain, constipation or diarrhea.   X-ray: AP pelvis reveal a superior/posterior hip dislocation   Blood pressure 115/63, pulse 79, temperature 97 F (36.1 C), temperature source Oral, resp. rate 16, height 5\' 1"  (1.549 m), weight 49.896 kg (110 lb), SpO2 95.00%.  In obvious discomfort Shortened internally rotated right lower extremity O/w NVI RLE  Assessment/Plan: Dislocated right total hip replacement To OR for closed reduction under anesthesia  [name] D 11/30/2011, 12:10 AM

## 2011-11-30 NOTE — Addendum Note (Signed)
Addendum  created 11/30/11 0223 by Darci Needle. [name]   Modules edited:Notes Section

## 2011-11-30 NOTE — Anesthesia Postprocedure Evaluation (Signed)
  Anesthesia Post-op Note  Patient: [name]  Procedure(s) Performed:  CLOSED REDUCTION HIP  Patient Location: PACU  Anesthesia Type: General  Level of Consciousness: awake, alert  and oriented  Airway and Oxygen Therapy: Patient Spontanous Breathing  Post-op Pain: none  Post-op Assessment: Post-op Vital signs reviewed  Post-op Vital Signs: Reviewed and stable  Complications: No apparent anesthesia complications

## 2011-11-30 NOTE — Transfer of Care (Signed)
Immediate Anesthesia Transfer of Care Note  Patient: [name]  Procedure(s) Performed:  CLOSED REDUCTION HIP  Patient Location: PACU  Anesthesia Type: General  Level of Consciousness: awake, alert  and oriented  Airway & Oxygen Therapy: Patient Spontanous Breathing and Patient connected to face mask oxygen  Post-op Assessment: Report given to PACU RN and Post -op Vital signs reviewed and stable  Post vital signs: Reviewed and stable  Complications: No apparent anesthesia complications

## 2011-11-30 NOTE — Anesthesia Procedure Notes (Signed)
Performed by: Lurlean Leyden, Adriaan Maltese L. Oxygen Delivery Method: Circle System Utilized Preoxygenation: Pre-oxygenation with 100% oxygen Intubation Type: IV induction Ventilation: Mask ventilation without difficulty and Oral airway inserted - appropriate to patient size

## 2011-11-30 NOTE — Brief Op Note (Signed)
11/29/2011 - 11/30/2011  1:24 AM  PATIENT:  [name]  60 y.o. female  PRE-OPERATIVE DIAGNOSIS:  dislocated right total hip  POST-OPERATIVE DIAGNOSIS:  dislocated right total hip  PROCEDURE:  Procedure(s): CLOSED REDUCTION HIP  SURGEON:  Surgeon(s): Shelda Pal  PHYSICIAN ASSISTANT: None   ANESTHESIA:   IV sedation  EBL:  Total I/O In: -  Out: 950 [Urine:950]  BLOOD ADMINISTERED:none  DRAINS: none   LOCAL MEDICATIONS USED:  NONE  SPECIMEN:  No Specimen  DISPOSITION OF SPECIMEN:  N/A  COUNTS:  YES  TOURNIQUET:  * No tourniquets in log *  DICTATION: .Other Dictation: Dictation Number [id number]  PLAN OF CARE: Discharge to home after PACU  PATIENT DISPOSITION:  PACU - hemodynamically stable.   Delay start of Pharmacological VTE agent (>24hrs) due to surgical blood loss or risk of bleeding:  {YES/NO/NOT APPLICABLE:20182

## 2011-11-30 NOTE — Op Note (Signed)
[name], [name]                 ACCOUNT NO.:  [id number]  MEDICAL RECORD NO.:  [id number]  LOCATION:  [facility]                         FACILITY:  [facility]  PHYSICIAN:  Madlyn Frankel. Charlann Boxer, M.D.  DATE OF BIRTH:  04-Apr-1951  DATE OF PROCEDURE:  11/30/2011 DATE OF DISCHARGE:                              OPERATIVE REPORT   PREOPERATIVE DIAGNOSIS:  Dislocated right total hip placement.  POSTOPERATIVE DIAGNOSIS:  Dislocated right total hip placement.  PROCEDURE:  Closed reduction of right total hip placement under anesthesia.  SURGEON:  Madlyn Frankel. Charlann Boxer, MD  ASSISTANT:  Surgical Team.  ANESTHESIA:  General IV sedation.  SPECIMENS:  None.  COMPLICATIONS:  None.  DRAINS:  None.  INDICATION FOR THE PROCEDURE:  [name] is a 60 year old female with history of a right total hip placement, comminuted by postoperative fracture resulting in revision surgeries.  She has ultimately had problems with instability and is currently scheduled to have revision surgery on Wednesday, December 04, 2011.  She unfortunately bent over to her right side, the affected side, trying to grab an object when her hip dislocated against tonight.  She was brought to emergency room.  Radiographs revealed a dislocated hip. After failed attempts in the emergency room, she was brought to the operating room for a closed reduction under anesthesia.  Risks and benefits have been discussed.  Consent was obtained.  PROCEDURE IN DETAIL:  The patient was brought to the operative theater. Once adequate anesthesia was established, a time-out was performed identifying the patient, planned procedure, and extremity.  Once adequate anesthesia was established, the hip was flexed, internally rotated and traction was applied.  The hip was palpably reduced.  Leg lengths were restored.  She had palpable pulses at the end of the case.  She was brought to the recovery room in stable condition.  She will be discharged home with a 16  inch knee immobilizer.  She should follow up for her planned surgery on Wednesday, December 04, 2011, for revision surgery with Dr. Ollen Gross.     Madlyn Frankel Charlann Boxer, M.D.     MDO/MEDQ  D:  11/30/2011  T:  11/30/2011  Job:  [id number]

## 2011-11-30 NOTE — Anesthesia Preprocedure Evaluation (Addendum)
Anesthesia Evaluation  Patient identified by MRN, date of birth, ID band Patient awake    Reviewed: Allergy & Precautions, H&P , NPO status , Patient's Chart, lab work & pertinent test results  Airway Mallampati: II TM Distance: >3 FB Neck ROM: Full    Dental No notable dental hx.    Pulmonary neg pulmonary ROS,  clear to auscultation  Pulmonary exam normal       Cardiovascular neg cardio ROS Regular Normal    Neuro/Psych PSYCHIATRIC DISORDERS Anxiety Negative Neurological ROS  Negative Psych ROS   GI/Hepatic negative GI ROS, Neg liver ROS,   Endo/Other  Negative Endocrine ROS  Renal/GU negative Renal ROS  Genitourinary negative   Musculoskeletal negative musculoskeletal ROS (+)   Abdominal   Peds negative pediatric ROS (+)  Hematology negative hematology ROS (+)   Anesthesia Other Findings   Reproductive/Obstetrics negative OB ROS                         Anesthesia Physical Anesthesia Plan  ASA: II  Anesthesia Plan: General   Post-op Pain Management:    Induction: Intravenous  Airway Management Planned: Mask  Additional Equipment:   Intra-op Plan:   Post-operative Plan:   Informed Consent: I have reviewed the patients History and Physical, chart, labs and discussed the procedure including the risks, benefits and alternatives for the proposed anesthesia with the patient or authorized representative who has indicated his/her understanding and acceptance.   Dental advisory given  Plan Discussed with: CRNA  Anesthesia Plan Comments:         Anesthesia Quick Evaluation

## 2011-11-30 NOTE — Anesthesia Postprocedure Evaluation (Signed)
  Anesthesia Post-op Note  Patient: [name]  Procedure(s) Performed:  CLOSED REDUCTION HIP  Patient Location: PACU  Anesthesia Type: General  Level of Consciousness: awake and alert   Airway and Oxygen Therapy: Patient Spontanous Breathing  Post-op Pain: mild  Post-op Assessment: Post-op Vital signs reviewed, Patient's Cardiovascular Status Stable, Respiratory Function Stable, Patent Airway and No signs of Nausea or vomiting  Post-op Vital Signs: stable  Complications: No apparent anesthesia complications

## 2011-12-02 ENCOUNTER — Ambulatory Visit (HOSPITAL_COMMUNITY): Admission: RE | Admit: 2011-12-02 | Payer: 59 | Source: Ambulatory Visit

## 2011-12-02 ENCOUNTER — Encounter (HOSPITAL_COMMUNITY)
Admission: RE | Admit: 2011-12-02 | Discharge: 2011-12-02 | Disposition: A | Discharge status: Home / Self Care | Payer: 59 | Source: Ambulatory Visit | Attending: Orthopedic Surgery | Admitting: Orthopedic Surgery

## 2011-12-02 ENCOUNTER — Encounter (HOSPITAL_COMMUNITY): Payer: Self-pay

## 2011-12-02 HISTORY — DX: Unspecified dislocation of right hip, initial encounter: S73.004A

## 2011-12-02 HISTORY — DX: Major depressive disorder, single episode, unspecified: F32.9

## 2011-12-02 HISTORY — DX: Inflammatory liver disease, unspecified: K75.9

## 2011-12-02 HISTORY — DX: Reserved for inherently not codable concepts without codable children: IMO0001

## 2011-12-02 HISTORY — DX: Diaphragmatic hernia without obstruction or gangrene: K44.9

## 2011-12-02 HISTORY — DX: Depression, unspecified: F32.A

## 2011-12-02 HISTORY — DX: Unspecified osteoarthritis, unspecified site: M19.90

## 2011-12-02 LAB — URINALYSIS, ROUTINE W REFLEX MICROSCOPIC
Bilirubin Urine: NEGATIVE
Ketones, ur: NEGATIVE mg/dL
Nitrite: NEGATIVE
pH: 6 (ref 5.0–8.0)

## 2011-12-02 LAB — APTT: aPTT: 42 seconds — ABNORMAL HIGH (ref 24–37)

## 2011-12-02 LAB — SURGICAL PCR SCREEN
MRSA, PCR: NEGATIVE
Staphylococcus aureus: NEGATIVE

## 2011-12-02 LAB — COMPREHENSIVE METABOLIC PANEL
ALT: 25 U/L (ref 0–35)
AST: 28 U/L (ref 0–37)
Albumin: 3.4 g/dL — ABNORMAL LOW (ref 3.5–5.2)
CO2: 29 mEq/L (ref 19–32)
Calcium: 9.6 mg/dL (ref 8.4–10.5)
Chloride: 98 mEq/L (ref 96–112)
Creatinine, Ser: 0.43 mg/dL — ABNORMAL LOW (ref 0.50–1.10)
GFR calc non Af Amer: >90 mL/min (ref >90)
Sodium: 135 mEq/L (ref 135–145)
Total Bilirubin: 0.2 mg/dL — ABNORMAL LOW (ref 0.3–1.2)

## 2011-12-02 LAB — CBC
Platelets: 494 10*3/uL — ABNORMAL HIGH (ref 150–400)
RBC: 4.88 MIL/uL (ref 3.87–5.11)
RDW: 14.4 % (ref 11.5–15.5)
WBC: 11 10*3/uL — ABNORMAL HIGH (ref 4.0–10.5)

## 2011-12-02 LAB — PROTIME-INR: INR: 1.03 (ref 0.00–1.49)

## 2011-12-02 NOTE — Patient Instructions (Signed)
20 [name]  12/02/2011   Your procedure is scheduled on:  12/04/11 430pm-630pm  Report to [facility] at 230 pm.  Call this number if you have problems the morning of surgery: [phone]   Remember:   Do not eat food:After Midnight.             You may have clear liquids from 12 midnite until 1000am then nothing by mouth .   Clear liquids include soda, tea, black coffee, apple or grape juice, broth.  Take these medicines the morning of surgery with A SIP OF WATER:   Do not wear jewelry, make-up or nail polish.  Do not wear lotions, powders, or perfumes.   Do not shave 48 hours prior to surgery.  Do not bring valuables to the hospital.  Contacts, dentures or bridgework may not be worn into surgery.  Leave suitcase in the car. After surgery it may be brought to your room.  For patients admitted to the hospital, checkout time is 11:00 AM the day of discharge.      Special Instructions: CHG Shower Use Special Wash: 1/2 bottle night before surgery and 1/2 bottle morning of surgery. shower chin to toes with CHG.  Wash face and private parts with regular soap.    Please read over the following fact sheets that you were given: MRSA Information, Incentive Spirometry Fact Sheet, Blood Transfusion Fact Sheet, coughing and deep breathing exercises

## 2011-12-03 ENCOUNTER — Encounter (HOSPITAL_COMMUNITY): Payer: Self-pay | Admitting: Orthopedic Surgery

## 2011-12-04 ENCOUNTER — Encounter (HOSPITAL_COMMUNITY): Payer: Self-pay | Admitting: *Deleted

## 2011-12-04 ENCOUNTER — Inpatient Hospital Stay (HOSPITAL_COMMUNITY)
Admission: RE | Admit: 2011-12-04 | Discharge: 2011-12-07 | DRG: 468 | Disposition: A | Discharge status: Home Health | Payer: 59 | Source: Ambulatory Visit | Attending: Orthopedic Surgery | Admitting: Orthopedic Surgery

## 2011-12-04 ENCOUNTER — Inpatient Hospital Stay (HOSPITAL_COMMUNITY): Payer: 59

## 2011-12-04 ENCOUNTER — Encounter (HOSPITAL_COMMUNITY): Payer: Self-pay | Admitting: Anesthesiology

## 2011-12-04 ENCOUNTER — Encounter (HOSPITAL_COMMUNITY)
Admission: RE | Disposition: A | Discharge status: Home Health | Payer: Self-pay | Source: Ambulatory Visit | Attending: Orthopedic Surgery

## 2011-12-04 ENCOUNTER — Inpatient Hospital Stay (HOSPITAL_COMMUNITY): Payer: 59 | Admitting: Anesthesiology

## 2011-12-04 DIAGNOSIS — D62 Acute posthemorrhagic anemia: Secondary | ICD-10-CM | POA: Diagnosis not present

## 2011-12-04 DIAGNOSIS — Z01812 Encounter for preprocedural laboratory examination: Secondary | ICD-10-CM

## 2011-12-04 DIAGNOSIS — IMO0002 Reserved for concepts with insufficient information to code with codable children: Secondary | ICD-10-CM | POA: Diagnosis present

## 2011-12-04 DIAGNOSIS — T84099A Other mechanical complication of unspecified internal joint prosthesis, initial encounter: Principal | ICD-10-CM | POA: Diagnosis present

## 2011-12-04 DIAGNOSIS — Y831 Surgical operation with implant of artificial internal device as the cause of abnormal reaction of the patient, or of later complication, without mention of misadventure at the time of the procedure: Secondary | ICD-10-CM | POA: Diagnosis present

## 2011-12-04 DIAGNOSIS — Z96649 Presence of unspecified artificial hip joint: Secondary | ICD-10-CM

## 2011-12-04 DIAGNOSIS — Z8719 Personal history of other diseases of the digestive system: Secondary | ICD-10-CM

## 2011-12-04 DIAGNOSIS — E876 Hypokalemia: Secondary | ICD-10-CM | POA: Diagnosis not present

## 2011-12-04 DIAGNOSIS — Z8701 Personal history of pneumonia (recurrent): Secondary | ICD-10-CM

## 2011-12-04 DIAGNOSIS — M129 Arthropathy, unspecified: Secondary | ICD-10-CM | POA: Diagnosis present

## 2011-12-04 DIAGNOSIS — F3289 Other specified depressive episodes: Secondary | ICD-10-CM | POA: Diagnosis present

## 2011-12-04 DIAGNOSIS — F329 Major depressive disorder, single episode, unspecified: Secondary | ICD-10-CM | POA: Diagnosis present

## 2011-12-04 DIAGNOSIS — K739 Chronic hepatitis, unspecified: Secondary | ICD-10-CM | POA: Diagnosis present

## 2011-12-04 HISTORY — PX: TOTAL HIP REVISION: SHX763

## 2011-12-04 SURGERY — TOTAL HIP REVISION
Anesthesia: General | Site: Hip | Laterality: Right | Wound class: Clean

## 2011-12-04 MED ORDER — METHOCARBAMOL 100 MG/ML IJ SOLN
500.0000 mg | Freq: Four times a day (QID) | INTRAVENOUS | Status: DC | PRN
  Administered 2011-12-04: 500 mg via INTRAVENOUS
  Filled 2011-12-04: qty 5

## 2011-12-04 MED ORDER — GABAPENTIN 800 MG PO TABS
800.0000 mg | ORAL_TABLET | Freq: Four times a day (QID) | ORAL | Status: DC
  Administered 2011-12-04 – 2011-12-06 (×6): 800 mg via ORAL
  Filled 2011-12-04 (×9): qty 1

## 2011-12-04 MED ORDER — ONDANSETRON HCL 4 MG/2ML IJ SOLN: 4.0000 mg | Freq: Four times a day (QID) | INTRAMUSCULAR | Status: DC | PRN

## 2011-12-04 MED ORDER — METOCLOPRAMIDE HCL 10 MG PO TABS: 5.0000 mg | ORAL_TABLET | Freq: Three times a day (TID) | ORAL | Status: DC | PRN

## 2011-12-04 MED ORDER — DIPHENHYDRAMINE HCL 50 MG/ML IJ SOLN
INTRAMUSCULAR | Status: AC
  Administered 2011-12-04: 12.5 mg via INTRAVENOUS
  Filled 2011-12-04: qty 1

## 2011-12-04 MED ORDER — ACETAMINOPHEN 10 MG/ML IV SOLN
1000.0000 mg | Freq: Four times a day (QID) | INTRAVENOUS | Status: AC
  Administered 2011-12-04 – 2011-12-05 (×4): 1000 mg via INTRAVENOUS
  Filled 2011-12-04 (×8): qty 100

## 2011-12-04 MED ORDER — DIPHENHYDRAMINE HCL 50 MG/ML IJ SOLN
12.5000 mg | Freq: Once | INTRAMUSCULAR | Status: AC
  Administered 2011-12-04: 12.5 mg via INTRAVENOUS

## 2011-12-04 MED ORDER — LIDOCAINE HCL (CARDIAC) 20 MG/ML IV SOLN
INTRAVENOUS | Status: DC | PRN
  Administered 2011-12-04: 20 mg via INTRAVENOUS

## 2011-12-04 MED ORDER — FLUOXETINE HCL 20 MG PO CAPS
40.0000 mg | ORAL_CAPSULE | ORAL | Status: DC
  Administered 2011-12-05 – 2011-12-07 (×3): 40 mg via ORAL
  Filled 2011-12-04 (×5): qty 2

## 2011-12-04 MED ORDER — DOCUSATE SODIUM 100 MG PO CAPS
100.0000 mg | ORAL_CAPSULE | Freq: Two times a day (BID) | ORAL | Status: DC
  Administered 2011-12-04 – 2011-12-07 (×6): 100 mg via ORAL
  Filled 2011-12-04 (×7): qty 1

## 2011-12-04 MED ORDER — DIPHENHYDRAMINE HCL 12.5 MG/5ML PO ELIX: 12.5000 mg | ORAL_SOLUTION | ORAL | Status: DC | PRN

## 2011-12-04 MED ORDER — BUPIVACAINE LIPOSOME 1.3 % IJ SUSP: 20.0000 mL | Freq: Once | INTRAMUSCULAR | Status: DC

## 2011-12-04 MED ORDER — ALPRAZOLAM 1 MG PO TABS
2.0000 mg | ORAL_TABLET | Freq: Every day | ORAL | Status: DC
  Administered 2011-12-04 – 2011-12-06 (×3): 2 mg via ORAL
  Filled 2011-12-04: qty 1
  Filled 2011-12-04: qty 2
  Filled 2011-12-04 (×2): qty 1

## 2011-12-04 MED ORDER — ACETAMINOPHEN 10 MG/ML IV SOLN
1000.0000 mg | Freq: Four times a day (QID) | INTRAVENOUS | Status: DC
  Filled 2011-12-04 (×4): qty 100

## 2011-12-04 MED ORDER — PHENOL 1.4 % MT LIQD: 1.0000 | OROMUCOSAL | Status: DC | PRN

## 2011-12-04 MED ORDER — ACETAMINOPHEN 650 MG RE SUPP: 650.0000 mg | Freq: Four times a day (QID) | RECTAL | Status: DC | PRN

## 2011-12-04 MED ORDER — LACTATED RINGERS IV SOLN: INTRAVENOUS | Status: DC

## 2011-12-04 MED ORDER — ACETAMINOPHEN 10 MG/ML IV SOLN: 1000.0000 mg | Freq: Once | INTRAVENOUS | Status: DC

## 2011-12-04 MED ORDER — SODIUM CHLORIDE 0.9 % IV SOLN: INTRAVENOUS | Status: DC

## 2011-12-04 MED ORDER — EPHEDRINE SULFATE 50 MG/ML IJ SOLN
INTRAMUSCULAR | Status: DC | PRN
  Administered 2011-12-04 (×2): 5 mg via INTRAVENOUS
  Administered 2011-12-04: 10 mg via INTRAVENOUS
  Administered 2011-12-04: 5 mg via INTRAVENOUS

## 2011-12-04 MED ORDER — ONDANSETRON HCL 4 MG/2ML IJ SOLN
INTRAMUSCULAR | Status: DC | PRN
  Administered 2011-12-04 (×2): 2 mg via INTRAVENOUS

## 2011-12-04 MED ORDER — MIDAZOLAM HCL 5 MG/5ML IJ SOLN
INTRAMUSCULAR | Status: DC | PRN
  Administered 2011-12-04: 2 mg via INTRAVENOUS

## 2011-12-04 MED ORDER — METOCLOPRAMIDE HCL 5 MG/ML IJ SOLN: 5.0000 mg | Freq: Three times a day (TID) | INTRAMUSCULAR | Status: DC | PRN

## 2011-12-04 MED ORDER — CEFAZOLIN SODIUM 1-5 GM-% IV SOLN
1.0000 g | Freq: Four times a day (QID) | INTRAVENOUS | Status: AC
  Administered 2011-12-05 (×3): 1 g via INTRAVENOUS
  Filled 2011-12-04 (×3): qty 50

## 2011-12-04 MED ORDER — OXYCODONE HCL 5 MG PO TABS
5.0000 mg | ORAL_TABLET | ORAL | Status: DC | PRN
  Administered 2011-12-05 (×3): 10 mg via ORAL
  Administered 2011-12-05: 5 mg via ORAL
  Administered 2011-12-06 (×2): 10 mg via ORAL
  Administered 2011-12-06 (×2): 20 mg via ORAL
  Administered 2011-12-06: 10 mg via ORAL
  Administered 2011-12-07: 20 mg via ORAL
  Filled 2011-12-04: qty 2
  Filled 2011-12-04: qty 4
  Filled 2011-12-04: qty 2
  Filled 2011-12-04 (×2): qty 4
  Filled 2011-12-04 (×3): qty 2
  Filled 2011-12-04: qty 1
  Filled 2011-12-04 (×2): qty 2

## 2011-12-04 MED ORDER — KETAMINE HCL 10 MG/ML IJ SOLN
INTRAMUSCULAR | Status: DC | PRN
  Administered 2011-12-04 (×2): 5 mg via INTRAVENOUS

## 2011-12-04 MED ORDER — RIVAROXABAN 10 MG PO TABS
10.0000 mg | ORAL_TABLET | Freq: Every day | ORAL | Status: DC
  Administered 2011-12-05 – 2011-12-07 (×3): 10 mg via ORAL
  Filled 2011-12-04 (×3): qty 1

## 2011-12-04 MED ORDER — DEXAMETHASONE SODIUM PHOSPHATE 10 MG/ML IJ SOLN: 10.0000 mg | Freq: Once | INTRAMUSCULAR | Status: DC

## 2011-12-04 MED ORDER — PROMETHAZINE HCL 25 MG/ML IJ SOLN: 6.2500 mg | INTRAMUSCULAR | Status: DC | PRN

## 2011-12-04 MED ORDER — METHOCARBAMOL 500 MG PO TABS
500.0000 mg | ORAL_TABLET | Freq: Four times a day (QID) | ORAL | Status: DC | PRN
  Administered 2011-12-05 – 2011-12-06 (×4): 500 mg via ORAL
  Filled 2011-12-04 (×4): qty 1

## 2011-12-04 MED ORDER — DIPHENHYDRAMINE HCL 50 MG/ML IJ SOLN
12.5000 mg | Freq: Once | INTRAMUSCULAR | Status: AC
  Administered 2011-12-04 (×2): 12.5 mg via INTRAVENOUS
  Filled 2011-12-04: qty 1

## 2011-12-04 MED ORDER — GLYCOPYRROLATE 0.2 MG/ML IJ SOLN
INTRAMUSCULAR | Status: DC | PRN
  Administered 2011-12-04: .4 mg via INTRAVENOUS

## 2011-12-04 MED ORDER — MENTHOL 3 MG MT LOZG: 1.0000 | LOZENGE | OROMUCOSAL | Status: DC | PRN

## 2011-12-04 MED ORDER — NEOSTIGMINE METHYLSULFATE 1 MG/ML IJ SOLN
INTRAMUSCULAR | Status: DC | PRN
  Administered 2011-12-04: 3 mg via INTRAVENOUS

## 2011-12-04 MED ORDER — CEFAZOLIN SODIUM 1-5 GM-% IV SOLN
1.0000 g | INTRAVENOUS | Status: AC
  Administered 2011-12-04: 1 g via INTRAVENOUS

## 2011-12-04 MED ORDER — CISATRACURIUM BESYLATE 2 MG/ML IV SOLN
INTRAVENOUS | Status: DC | PRN
  Administered 2011-12-04: 3 mg via INTRAVENOUS
  Administered 2011-12-04: 4 mg via INTRAVENOUS

## 2011-12-04 MED ORDER — LACTATED RINGERS IV SOLN
INTRAVENOUS | Status: DC | PRN
  Administered 2011-12-04 (×2): via INTRAVENOUS

## 2011-12-04 MED ORDER — KCL IN DEXTROSE-NACL 20-5-0.9 MEQ/L-%-% IV SOLN
INTRAVENOUS | Status: DC
  Administered 2011-12-04 – 2011-12-05 (×2): via INTRAVENOUS
  Filled 2011-12-04 (×3): qty 1000

## 2011-12-04 MED ORDER — ACETAMINOPHEN 325 MG PO TABS: 650.0000 mg | ORAL_TABLET | Freq: Four times a day (QID) | ORAL | Status: DC | PRN

## 2011-12-04 MED ORDER — HETASTARCH-ELECTROLYTES 6 % IV SOLN
INTRAVENOUS | Status: DC | PRN
  Administered 2011-12-04: 18:00:00 via INTRAVENOUS

## 2011-12-04 MED ORDER — FENTANYL CITRATE 0.05 MG/ML IJ SOLN
INTRAMUSCULAR | Status: DC | PRN
  Administered 2011-12-04: 50 ug via INTRAVENOUS
  Administered 2011-12-04: 100 ug via INTRAVENOUS
  Administered 2011-12-04 (×5): 50 ug via INTRAVENOUS
  Administered 2011-12-04: 100 ug via INTRAVENOUS

## 2011-12-04 MED ORDER — MORPHINE SULFATE 2 MG/ML IJ SOLN
1.0000 mg | INTRAMUSCULAR | Status: DC | PRN
  Administered 2011-12-04 – 2011-12-05 (×7): 2 mg via INTRAVENOUS
  Filled 2011-12-04 (×9): qty 1

## 2011-12-04 MED ORDER — CHLORHEXIDINE GLUCONATE 4 % EX LIQD: 60.0000 mL | Freq: Once | CUTANEOUS | Status: DC

## 2011-12-04 MED ORDER — CLONAZEPAM 0.5 MG PO TABS
0.5000 mg | ORAL_TABLET | Freq: Every day | ORAL | Status: DC
  Administered 2011-12-04 – 2011-12-06 (×3): 0.5 mg via ORAL
  Filled 2011-12-04 (×3): qty 1

## 2011-12-04 MED ORDER — HYDROMORPHONE HCL PF 1 MG/ML IJ SOLN
INTRAMUSCULAR | Status: AC
  Filled 2011-12-04: qty 1

## 2011-12-04 MED ORDER — TEMAZEPAM 15 MG PO CAPS: 15.0000 mg | ORAL_CAPSULE | Freq: Every evening | ORAL | Status: DC | PRN

## 2011-12-04 MED ORDER — PROPOFOL 10 MG/ML IV EMUL
INTRAVENOUS | Status: DC | PRN
  Administered 2011-12-04: 120 mg via INTRAVENOUS

## 2011-12-04 MED ORDER — ACETAMINOPHEN 10 MG/ML IV SOLN
INTRAVENOUS | Status: DC | PRN
  Administered 2011-12-04: 1000 mg via INTRAVENOUS

## 2011-12-04 MED ORDER — HYDROMORPHONE HCL PF 1 MG/ML IJ SOLN
0.2500 mg | INTRAMUSCULAR | Status: DC | PRN
  Administered 2011-12-04 (×2): 0.5 mg via INTRAVENOUS

## 2011-12-04 MED ORDER — ONDANSETRON HCL 4 MG PO TABS
4.0000 mg | ORAL_TABLET | Freq: Four times a day (QID) | ORAL | Status: DC | PRN
  Filled 2011-12-04: qty 4

## 2011-12-04 MED ORDER — SUCCINYLCHOLINE CHLORIDE 20 MG/ML IJ SOLN
INTRAMUSCULAR | Status: DC | PRN
  Administered 2011-12-04: 80 mg via INTRAVENOUS

## 2011-12-04 MED ORDER — PANTOPRAZOLE SODIUM 40 MG PO TBEC
40.0000 mg | DELAYED_RELEASE_TABLET | Freq: Every day | ORAL | Status: DC
  Administered 2011-12-04: 40 mg via ORAL
  Filled 2011-12-04 (×2): qty 1

## 2011-12-04 SURGICAL SUPPLY — 60 items
BAG ZIPLOCK 12X15 (MISCELLANEOUS) ×6 IMPLANT
BIT DRILL 2.8X128 (BIT) ×2 IMPLANT
BLADE EXTENDED COATED 6.5IN (ELECTRODE) ×2 IMPLANT
BLADE SAW SAG 73X25 THK (BLADE) ×1
BLADE SAW SGTL 73X25 THK (BLADE) ×1 IMPLANT
CATH KIT ON-Q SILVERSOAK 5IN (CATHETERS) ×2 IMPLANT
CLOSURE STERI STRIP 1/2 X4 (GAUZE/BANDAGES/DRESSINGS) ×2 IMPLANT
CLOTH BEACON ORANGE TIMEOUT ST (SAFETY) ×2 IMPLANT
CONT SPECI 4OZ STER CLIK (MISCELLANEOUS) IMPLANT
CUP TRID HEMIS MULTI (Orthopedic Implant) ×2 IMPLANT
DRAPE INCISE IOBAN 66X45 STRL (DRAPES) ×2 IMPLANT
DRAPE ORTHO SPLIT 77X108 STRL (DRAPES) ×2
DRAPE POUCH INSTRU U-SHP 10X18 (DRAPES) ×2 IMPLANT
DRAPE SURG ORHT 6 SPLT 77X108 (DRAPES) ×2 IMPLANT
DRAPE U-SHAPE 47X51 STRL (DRAPES) ×2 IMPLANT
DRESSING ALLEVYN BORDER HEEL (GAUZE/BANDAGES/DRESSINGS) ×2 IMPLANT
DRSG EMULSION OIL 3X16 NADH (GAUZE/BANDAGES/DRESSINGS) ×2 IMPLANT
DRSG MEPILEX BORDER 4X4 (GAUZE/BANDAGES/DRESSINGS) ×4 IMPLANT
DRSG MEPILEX BORDER 4X8 (GAUZE/BANDAGES/DRESSINGS) ×2 IMPLANT
DURAPREP 26ML APPLICATOR (WOUND CARE) ×2 IMPLANT
ELECT REM PT RETURN 9FT ADLT (ELECTROSURGICAL) ×2
ELECTRODE REM PT RTRN 9FT ADLT (ELECTROSURGICAL) ×1 IMPLANT
EVACUATOR 1/8 PVC DRAIN (DRAIN) ×2 IMPLANT
FACESHIELD LNG OPTICON STERILE (SAFETY) ×8 IMPLANT
GLOVE BIO SURGEON STRL SZ7.5 (GLOVE) ×2 IMPLANT
GLOVE BIO SURGEON STRL SZ8 (GLOVE) ×2 IMPLANT
GLOVE BIOGEL PI IND STRL 8 (GLOVE) ×2 IMPLANT
GLOVE BIOGEL PI INDICATOR 8 (GLOVE) ×2
GOWN STRL NON-REIN LRG LVL3 (GOWN DISPOSABLE) ×2 IMPLANT
GOWN STRL REIN XL XLG (GOWN DISPOSABLE) ×2 IMPLANT
HEAD FEM SROM 28 +6 (Hips) ×2 IMPLANT
IMMOBILIZER KNEE 20 (SOFTGOODS) ×2
IMMOBILIZER KNEE 20 THIGH 36 (SOFTGOODS) ×1 IMPLANT
INSERT X3 ADM/MDM 28MM 28/48 (Orthopedic Implant) ×2 IMPLANT
KIT BASIN OR (CUSTOM PROCEDURE TRAY) ×2 IMPLANT
LINER 42MM E (Orthopedic Implant) ×2 IMPLANT
MANIFOLD NEPTUNE II (INSTRUMENTS) ×2 IMPLANT
NDL SAFETY ECLIPSE 18X1.5 (NEEDLE) IMPLANT
NEEDLE HYPO 18GX1.5 SHARP (NEEDLE)
NS IRRIG 1000ML POUR BTL (IV SOLUTION) ×2 IMPLANT
PACK TOTAL JOINT (CUSTOM PROCEDURE TRAY) ×2 IMPLANT
PASSER SUT SWANSON 36MM LOOP (INSTRUMENTS) ×2 IMPLANT
POSITIONER SURGICAL ARM (MISCELLANEOUS) ×2 IMPLANT
SCREW OSTEO 16MM (Screw) ×2 IMPLANT
SCREW OSTEO 30MM (Screw) ×4 IMPLANT
SPONGE GAUZE 4X4 12PLY (GAUZE/BANDAGES/DRESSINGS) ×2 IMPLANT
SPONGE LAP 18X18 X RAY DECT (DISPOSABLE) ×2 IMPLANT
STAPLER VISISTAT 35W (STAPLE) ×2 IMPLANT
SUCTION FRAZIER TIP 10 FR DISP (SUCTIONS) ×2 IMPLANT
SUT ETHIBOND NAB CT1 #1 30IN (SUTURE) ×4 IMPLANT
SUT VIC AB 1 CT1 27 (SUTURE) ×3
SUT VIC AB 1 CT1 27XBRD ANTBC (SUTURE) ×3 IMPLANT
SUT VIC AB 2-0 CT1 27 (SUTURE) ×3
SUT VIC AB 2-0 CT1 TAPERPNT 27 (SUTURE) ×3 IMPLANT
SWAB COLLECTION DEVICE MRSA (MISCELLANEOUS) ×2 IMPLANT
SYR 50ML LL SCALE MARK (SYRINGE) IMPLANT
TOWEL OR 17X26 10 PK STRL BLUE (TOWEL DISPOSABLE) ×4 IMPLANT
TRAY FOLEY CATH 14FRSI W/METER (CATHETERS) ×2 IMPLANT
TUBE ANAEROBIC SPECIMEN COL (MISCELLANEOUS) IMPLANT
WATER STERILE IRR 1500ML POUR (IV SOLUTION) ×2 IMPLANT

## 2011-12-04 NOTE — Anesthesia Postprocedure Evaluation (Signed)
  Anesthesia Post-op Note  Patient: [name]  Procedure(s) Performed:  TOTAL HIP REVISION - Right Acetabular Revision  Patient Location: PACU  Anesthesia Type: General  Level of Consciousness: awake and alert   Airway and Oxygen Therapy: Patient Spontanous Breathing  Post-op Pain: mild  Post-op Assessment: Post-op Vital signs reviewed, Patient's Cardiovascular Status Stable, Respiratory Function Stable, Patent Airway and No signs of Nausea or vomiting  Post-op Vital Signs: stable  Complications: No apparent anesthesia complications

## 2011-12-04 NOTE — Anesthesia Preprocedure Evaluation (Signed)
Anesthesia Evaluation  Patient identified by MRN, date of birth, ID band Patient awake    Reviewed: Allergy & Precautions, H&P , NPO status , Patient's Chart, lab work & pertinent test results  Airway Mallampati: I TM Distance: >3 FB Neck ROM: Full    Dental No notable dental hx.    Pulmonary neg pulmonary ROS, Current Smoker,  clear to auscultation  Pulmonary exam normal       Cardiovascular neg cardio ROS Regular Normal    Neuro/Psych Negative Neurological ROS  Negative Psych ROS   GI/Hepatic negative GI ROS, Neg liver ROS, hiatal hernia,   Endo/Other  Negative Endocrine ROS  Renal/GU negative Renal ROS  Genitourinary negative   Musculoskeletal negative musculoskeletal ROS (+)   Abdominal   Peds negative pediatric ROS (+)  Hematology negative hematology ROS (+)   Anesthesia Other Findings   Reproductive/Obstetrics negative OB ROS                           Anesthesia Physical Anesthesia Plan  ASA: II  Anesthesia Plan: General   Post-op Pain Management:    Induction: Intravenous  Airway Management Planned: Oral ETT  Additional Equipment:   Intra-op Plan:   Post-operative Plan: Extubation in OR  Informed Consent: I have reviewed the patients History and Physical, chart, labs and discussed the procedure including the risks, benefits and alternatives for the proposed anesthesia with the patient or authorized representative who has indicated his/her understanding and acceptance.   Dental advisory given  Plan Discussed with: CRNA  Anesthesia Plan Comments:         Anesthesia Quick Evaluation

## 2011-12-04 NOTE — Transfer of Care (Signed)
Immediate Anesthesia Transfer of Care Note  Patient: [name]  Procedure(s) Performed:  TOTAL HIP REVISION - Right Acetabular Revision  Patient Location: PACU  Anesthesia Type: General  Level of Consciousness: awake and alert   Airway & Oxygen Therapy: Patient Spontanous Breathing and Patient connected to face mask  Post-op Assessment: Report given to PACU RN and Post -op Vital signs reviewed and stable  Post vital signs: Reviewed and stable  Complications: No apparent anesthesia complications

## 2011-12-04 NOTE — Brief Op Note (Signed)
12/04/2011  7:05 PM  PATIENT:  Pershing Cox  60 y.o. female  PRE-OPERATIVE DIAGNOSIS:  Unstable Right Total Hip Arthroplasty  POST-OPERATIVE DIAGNOSIS:  Unstable Right Total Hip Arthroplasty  PROCEDURE:  Procedure(s): Right acetabular revision  SURGEON:  Surgeon(s): Gus Rankin Keyen Marban  PHYSICIAN ASSISTANT:   ASSISTANTS: Avel Peace, PA-C   ANESTHESIA:   general  EBL:   300  BLOOD ADMINISTERED:none  DRAINS: (medium) Hemovact drain(s) in the right hip with  Suction Open   LOCAL MEDICATIONS USED:  NONE  SPECIMEN:  No Specimen  DISPOSITION OF SPECIMEN:  N/A  COUNTS:  YES  TOURNIQUET:  * No tourniquets in log *  DICTATION: .Other Dictation: Dictation Number [id number]  PLAN OF CARE: Admit to inpatient   PATIENT DISPOSITION:  PACU - hemodynamically stable.   Gus Rankin Duwan Adrian, MD    12/04/2011, 7:12 PM

## 2011-12-04 NOTE — Interval H&P Note (Signed)
History and Physical Interval Note:  12/04/2011 5:16 PM  [name]  has presented today for surgery, with the diagnosis of Unstable Right Total Hip Arthroplasty  The various methods of treatment have been discussed with the patient and family. After consideration of risks, benefits and other options for treatment, the patient has consented to  Procedure(s): TOTAL HIP REVISION as a surgical intervention .  The patients' history has been reviewed, patient examined, no change in status, stable for surgery.  I have reviewed the patients' chart and labs.  Questions were answered to the patient's satisfaction.     Loanne Drilling

## 2011-12-04 NOTE — Progress Notes (Signed)
Family updated about surgery time. Dr Lequita Halt is running 2 hours behind. Family informed and they verbalize understanding.

## 2011-12-04 NOTE — H&P (View-Only) (Signed)
[name]  DOB: 01/13/1951  Date of Admission:  12/04/2011  Chief Complaint:  Right Hip Pain  History of Present Illness The patient is a 60 year old female who comes in today for a preoperative History and Physical. The patient is scheduled for a Right Total Hip Revision vs Acetabular Revision to be performed by Dr. Frank V. Aluisio, MD at [facility] on 12/04/2011. The patient is status post right total hip arthroplasty (acetabular revision). The patient states that she is not doing well at this time. The patient feels that they are progressing poorly at this time. She has dislocated twice since her last visit here. She was seen at [facility] and had the hip reduced there. She has unfortunately had two dislocations. In the past, she had a few dislocations reaching for her dog. She states that she had an atraumatic reduction the first time but had a lot of trauma with the reduction the second time. The patient is not having a tremendous amount of groin pain but does have lateral pain from where they pulled on her hip. It is felt that she would benefit from undergoing revision surgery. Risks and benefits have been discussed with the patient and they elect to proceed with surgery. There are no current contradindications to the procedure such as ongoing infection or progressive neurological disease.  Allergies AUGMENTIN. 02/07/2006 Vomiting.  Problem List/Past Medical Hiatal Hernia Degenerative Disc Disease Menopause Osteoporosis Osteoarthritis Pneumonia Diverticulitis Of Colon History of ARDS (acute respiratory distress syndrome) following prev. back surgery (11 hours) Anxiety Disorder Chronic Hepatitis  Family History Osteoarthritis. mother Osteoporosis. mother Father. Deceased, Chronic Renal Failure Syndrome, Colon Cancer, Heart disease. Age 84, Was on dialysis. Bladder Cancer. Mother. Deceased, Postmenopausal Osteoporosis. Age 81, peforated  colon.  Social History Alcohol use. Occasional alcohol use. current drinker; drinks wine; only occasionally per week Children. 1 Current work status. working part time Exercise. Exercises never Living situation. live with spouse Marital status. married Number of flights of stairs before winded. 2-3 Tobacco use. Current every day smoker. current every day smoker; smoke(d) 1 pack(s) per day Post-Surgical Plans. Plan to go Home. Advance Directives. Living Will and Healthcare POA  Medication History Percocet ( Oral) Specific dose unknown - Active. Gabapentin (800MG Tablet, Oral) Active. ClonazePAM (0.5MG Tablet, Oral) Active. ALPRAZolam (0.5MG Tablet, Oral) Active. PROzac (20MG Capsule, Oral) Active. Omeprazole (20MG Capsule DR, Oral) Active.  Past Surgical History Arthroscopy of Knee. right Breast Mass; Local Excision. left Carpal Tunnel Repair. bilateral Spinal Fusion. lower back Spinal Surgery Tonsillectomy Total Hip Replacement. bilateral  Review of Systems General:Present- Night Sweats. Not Present- Chills, Fever, Appetite Loss, Fatigue, Feeling sick, Weight Gain and Weight Loss. Skin:Not Present- Itching, Rash, Skin Color Changes, Ulcer, Psoriasis and Change in Hair or Nails. HEENT:Present- Ringing in the Ears. Not Present- Sensitivity to light, Hearing problems and Nose Bleed. Neck:Not Present- Swollen Glands and Neck Mass. Respiratory:Not Present- Snoring, Chronic Cough, Bloody sputum and Dyspnea. Cardiovascular:Not Present- Shortness of Breath, Chest Pain, Swelling of Extremities, Leg Cramps and Palpitations. Gastrointestinal:Not Present- Bloody Stool, Heartburn, Abdominal Pain, Vomiting, Nausea and Incontinence of Stool. Female Genitourinary:Not Present- Blood in Urine, Menstrual Irregularities, Frequency, Incontinence and Nocturia. Musculoskeletal:Present- Muscle Weakness, Muscle Pain, Joint Stiffness, Joint Pain, Back Pain and Spasms. Not  Present- Joint Swelling. Neurological:Not Present- Tingling, Numbness, Burning, Tremor, Headaches and Dizziness. Psychiatric:Not Present- Anxiety, Depression and Memory Loss. Endocrine:Not Present- Cold Intolerance, Heat Intolerance, Excessive hunger and Excessive Thirst. Hematology:Not Present- Abnormal Bleeding, Anemia, Blood Clots and Easy Bruising.    Vitals 11/26/2011 3:18 PM Weight: 110 lb Height: 61 in Body Surface Area: 1.47 m Body Mass Index: 20.78 kg/m Pulse: 92 (Regular) Resp.: 16 (Unlabored) BP: 118/76 (Sitting, Right Arm, Standard)  Physical Exam The physical exam findings are as follows:  Patient is a 60 year old white, petite framed female with continued right hip problems.  General Mental Status - Alert, cooperative and good historian. General Appearance- pleasant. Not in acute distress. Orientation- Oriented X3. Build & Nutrition- Lean, Petite, Well nourished and Well developed.  Head and Neck Head- normocephalic, atraumatic . Neck Global Assessment- supple. no bruit auscultated on the right and no bruit auscultated on the left.  Eye Pupil- Bilateral- Regular and Round. Motion- Bilateral- EOMI.  Chest and Lung Exam Auscultation: Breath sounds:- clear at anterior chest wall and - clear at posterior chest wall. Adventitious sounds:- No Adventitious sounds.  Cardiovascular Auscultation:Rhythm- Regular rate and rhythm. Heart Sounds- S1 WNL and S2 WNL. Murmurs & Other Heart Sounds: Murmur 1:Location- Aortic Area and Sternal Border - Left. Timing- Holosystolic. Grade- III/VI.  Abdomen Palpation/Percussion:Palpation and Percussion of the abdomen reveal - Non Tender and Soft. Note: Flat Rigidity (guarding)- Abdomen is soft. Auscultation:Auscultation of the abdomen reveals - Bowel sounds normal.  Female Genitourinary Note: Not done, not pertinent to present illness  Musculoskeletal On examination, well-developed  female, alert and oriented, in no apparent distress. There is moderate swelling around the buttock and the incision area. There is no warmth. There is no drainage.  RADIOGRAPHS: Radiographs show concentric reduction of the hip.  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 Acetaular Revision per Dr. Aluisio.  Plan to go home after hopsital stay.  Drew Hyacinth Marcelli, PA-C   

## 2011-12-04 NOTE — Op Note (Signed)
[name], [name]                 ACCOUNT NO.:  [id number]  MEDICAL RECORD NO.:  [id number]  LOCATION:  1613                         FACILITY:  [facility]  PHYSICIAN:  Ollen Gross, M.D.    DATE OF BIRTH:  09-13-1951  DATE OF PROCEDURE:  12/04/2011 DATE OF DISCHARGE:                              OPERATIVE REPORT   PREOPERATIVE DIAGNOSIS:  Recurrent instability, right total hip arthroplasty.  POSTOPERATIVE DIAGNOSIS:  Recurrent instability, right total hip arthroplasty.  PROCEDURE:  Right acetabular revision.  SURGEON:  Ollen Gross, M.D.  ASSISTANT:  Alexzandrew L. Perkins, P.A.C.  ANESTHESIA:  General.  BLOOD LOSS:  300.  DRAINS:  Hemovac x1.  COMPLICATION:  None.  CONDITION:  Stable to Recovery.  CLINICAL INDICATION:  [name] is a 60 year old female with a long complex history in regards to her right hip.  Despite numerous efforts to treat her instability she has had recurrent instability.  It was felt that the best option for her would be the Stryker tripolar system.  She presents now for acetabular revision to the tripolar system.  PROCEDURE IN DETAIL:  After successful administration of general anesthetic, the patient was placed in the left lateral decubitus position with the right side up and held with the hip positioner.  Right lower extremity was isolated from the perineum with plastic drapes and prepped and draped in the usual sterile fashion.  Previous posterolateral incisions were utilized, skin cut with a #10 blade through subcutaneous tissue to the level of fascia lata, which was incised in line with the skin incision.  The sciatic nerve was palpated and protected.  Short rotators were ready off of the femur from her previous dislocation.  The wound was copiously irrigated with saline solution.  The hip was then dislocated.  It took a fairly excessive position of 70 degrees flexion, 40 degrees adduction, 90 degrees internal rotation to get her to dislocate.   The femoral head was then removed from the stem.  The stem was in excellent position.  Eversion is 20 degrees, which matches her native.  The femur was then retracted anteriorly to gain acetabular exposure.  The acetabular liner was removed from the shell.  I removed the 2 screws from the shell.  Using the [facility] and the revision osteotomes the acetabular shell was separated from the underlying bone.  This was removed with minimal if any bone loss.  There was a 50 mm shell.  I began acetabular reaming at 49 mm up to 51 mm.  A 52 mm Trident hemispherical acetabular shell was then impacted into the acetabulum with good fit.  It was transfixed with 3 dome screws with excellent purchase.  I rethreaded the impactor back into the shell and the shell and pelvis were subsequently moving as a single unit.  Thus it was a very solid fixation.  We placed a trial MDM 42 mm liner with a 28 +0 head and placed it onto the stem.  That reduced fairly easily, so went to a +6 head, which had perfect soft tissue tension.  She had full extension, full external rotation, 70 degrees flexion, 40 degrees adduction, 90 degrees internal rotation, 90 degrees of flexion, 70  degrees internal rotation, and the cup was not sliding at all.  It remained very stable throughout that fairly rigorous range of motion.  By placing the right leg on top of the left, the leg lengths remained equal.  The hip was then dislocated and trials removed.  The permanent 42 mm MDM liner was impacted into the acetabular shell.  The 42E poly for the tripolar was then coupled with a 28 +6 head and they were impacted together.  The head was then placed onto the femoral neck and impacted.  The hip was reduced with great stability, same as the trial.  The wound was then copiously irrigated with saline solution and the posterior pseudocapsule reattached to the femur through drill holes.  The fascia was closed over Hemovac drain with  interrupted 1 Vicryl, subcu closed with #1 and 2-0 Vicryl, and skin closed with staples.  The incision was cleaned and dried and a bulky sterile dressing applied.  She was placed into a knee immobilizer, awakened, and transported to Recovery in stable condition.     Ollen Gross, M.D.     FA/MEDQ  D:  12/04/2011  T:  12/04/2011  Job:  [id number]

## 2011-12-05 DIAGNOSIS — D62 Acute posthemorrhagic anemia: Secondary | ICD-10-CM | POA: Diagnosis not present

## 2011-12-05 LAB — BASIC METABOLIC PANEL
BUN: 7 mg/dL (ref 6–23)
GFR calc Af Amer: >90 mL/min (ref >90)
GFR calc non Af Amer: >90 mL/min (ref >90)
Potassium: 3.3 mEq/L — ABNORMAL LOW (ref 3.5–5.1)
Sodium: 135 mEq/L (ref 135–145)

## 2011-12-05 LAB — CBC
MCHC: 30.2 g/dL (ref 30.0–36.0)
Platelets: 374 10*3/uL (ref 150–400)
RDW: 14.4 % (ref 11.5–15.5)

## 2011-12-05 MED ORDER — NON FORMULARY: 20.0000 mg | Freq: Every day | Status: DC

## 2011-12-05 MED ORDER — POLYSACCHARIDE IRON 150 MG PO CAPS
150.0000 mg | ORAL_CAPSULE | Freq: Every day | ORAL | Status: DC
  Administered 2011-12-05 – 2011-12-07 (×3): 150 mg via ORAL
  Filled 2011-12-05 (×3): qty 1

## 2011-12-05 MED ORDER — OMEPRAZOLE 20 MG PO CPDR
20.0000 mg | DELAYED_RELEASE_CAPSULE | Freq: Every day | ORAL | Status: DC
  Administered 2011-12-05 – 2011-12-06 (×2): 20 mg via ORAL
  Filled 2011-12-05 (×3): qty 1

## 2011-12-05 NOTE — Progress Notes (Signed)
Subjective: 1 Day Post-Op Procedure(s) (LRB): TOTAL HIP REVISION (Right) Patient reports pain as mild and moderate.   Patient seen in rounds with Dr. Lequita Halt. Patient has complaints of pain in hip.    We will start therapy today. Plan is to go home after hospital stay.  Objective: Vital signs in last 24 hours: Temp:  [97.4 F (36.3 C)-98.7 F (37.1 C)] 98.7 F (37.1 C) (12/13 1023) Pulse Rate:  [63-87] 83  (12/13 1023) Resp:  [12-20] 14  (12/13 1023) BP: (66-136)/(36-82) 120/61 mmHg (12/13 1023) SpO2:  [92 %-100 %] 93 % (12/13 1023) Weight:  [49.896 kg (110 lb)] 110 lb (49.896 kg) (12/12 2015)  Intake/Output from previous day:  Intake/Output Summary (Last 24 hours) at 12/05/11 1058 Last data filed at 12/05/11 0853  Gross per 24 hour  Intake   2550 ml  Output   2495 ml  Net     55 ml    Intake/Output this shift: Total I/O In: 360 [P.O.:360] Out: -   Labs:  Basename 12/05/11 0345 12/02/11 1145  HGB 9.8* 12.8    Basename 12/05/11 0345 12/02/11 1145  WBC 8.9 11.0*  RBC 3.81* 4.88  HCT 32.5* 41.3  PLT 374 494*    Basename 12/05/11 0345 12/02/11 1145  NA 135 135  K 3.3* 4.0  CL 105 98  CO2 26 29  BUN 7 14  CREATININE 0.41* 0.43*  GLUCOSE 114* 90  CALCIUM 7.7* 9.6    Basename 12/02/11 1145  LABPT --  INR 1.03    Exam - Neurovascular intact Sensation intact distally Dressing - clean, dry Motor function intact - moving foot and toes well on exam.  Hemovac pulled without difficulty.  Assessment/Plan: 1 Day Post-Op Procedure(s) (LRB): TOTAL HIP REVISION (Right)  Past Medical History  Diagnosis Date  . Osteoporosis   . Hepatitis     dx in 1991   . Hiatal hernia   . Arthritis   . Depression   . Dislocation of right hip   . Dislocation     pt has had multiple dislocations of both hips     Advance diet Up with therapy Discharge home with home health when met goals  DVT Prophylaxis - Xarelto Protocol Partial-Weight Bearing 25-50% right  Leg D/C Knee Immobilizer Hemovac Pulled Begin Therapy Hip Preacutions Keep foley until tomorrow. No vaccines.  PERKINS, ALEXZANDREW 12/05/2011, 10:58 AM

## 2011-12-05 NOTE — Progress Notes (Signed)
Physical Therapy Evaluation Patient Details Name: [name] MRN: [id number] DOB: 03/14/1951 Today's Date: 12/05/2011 1143 - 1207; EVAL Problem List:  Patient Active Problem List  Diagnoses  . Postop Acute blood loss anemia    Past Medical History:  Past Medical History  Diagnosis Date  . Osteoporosis   . Hepatitis     dx in 1991   . Hiatal hernia   . Arthritis   . Depression   . Dislocation of right hip   . Dislocation     pt has had multiple dislocations of both hips    Past Surgical History:  Past Surgical History  Procedure Date  . Hip surgery   . Back surgery   . Joint replacement   . Knee surgery   . Other surgical history     6 back surgeries - fusions thoracic to lumbar   . Other surgical history     right femur surgery - 3 surgeries - plate knee to hip   . Hip closed reduction 11/30/2011    Procedure: CLOSED REDUCTION HIP;  Surgeon: Shelda Pal;  Location: [facility];  Service: Orthopedics;  Laterality: Right;    PT Assessment/Plan/Recommendation PT Assessment Clinical Impression Statement: Pt with R THR revision presents with decreased R LE strength/AROM and decreased functional mobility.  Pt will benefit from skilled PT intervention to maximize IND for d/c home. PT Recommendation/Assessment: Patient will need skilled PT in the acute care venue PT Problem List: Decreased strength;Decreased range of motion;Decreased activity tolerance;Decreased mobility;Decreased knowledge of use of DME;Pain PT Therapy Diagnosis : Difficulty walking PT Plan PT Frequency: 7X/week PT Treatment/Interventions: DME instruction;Gait training;Stair training;Functional mobility training;Therapeutic activities;Therapeutic exercise;Patient/family education PT Recommendation Recommendations for Other Services: OT consult Follow Up Recommendations: Home health PT Equipment Recommended: None recommended by PT PT Goals  Acute Rehab PT Goals PT Goal Formulation: With patient Time For  Goal Achievement: 7 days Pt will go Supine/Side to Sit: with supervision PT Goal: Supine/Side to Sit - Progress: Not met Pt will go Sit to Supine/Side: with supervision PT Goal: Sit to Supine/Side - Progress: Not met Pt will go Sit to Stand: with supervision PT Goal: Sit to Stand - Progress: Not met Pt will go Stand to Sit: with supervision PT Goal: Stand to Sit - Progress: Not met Pt will Ambulate: 51 - 150 feet;with supervision;with rolling walker PT Goal: Ambulate - Progress: Not met Pt will Go Up / Down Stairs: 1-2 stairs;with least restrictive assistive device;with min assist PT Goal: Up/Down Stairs - Progress: Not met  PT Evaluation Precautions/Restrictions  Precautions Precautions: Posterior Hip Precaution Comments: sign hung in room Restrictions Weight Bearing Restrictions: Yes RLE Weight Bearing: Partial weight bearing RLE Partial Weight Bearing Percentage or Pounds: 25 - 50% Prior Functioning  Home Living Lives With: Spouse Receives Help From: Family Type of Home: House Home Layout: One level Home Access: Stairs to enter Entrance Stairs-Rails: Left Entrance Stairs-Number of Steps: 2 Prior Function Level of Independence: Independent with basic ADLs;Independent with gait;Independent with transfers Able to Take Stairs?: Yes Cognition Cognition Arousal/Alertness: Awake/alert Overall Cognitive Status: Appears within functional limits for tasks assessed Orientation Level: Oriented X4 Sensation/Coordination Coordination Gross Motor Movements are Fluid and Coordinated: Yes Extremity Assessment RUE Assessment RUE Assessment: Within Functional Limits LUE Assessment LUE Assessment: Within Functional Limits RLE Assessment RLE Assessment: Exceptions to [facility] RLE PROM (degrees) Overall PROM Right Lower Extremity:  (WFL following THP) RLE Strength RLE Overall Strength:  (2+/5 hip, 3/5 quads) Mobility (including Balance) Bed Mobility  Bed Mobility: Yes Supine to Sit: 4:  Min assist;3: Mod assist Supine to Sit Details (indicate cue type and reason): cues for technique and THP Transfers Transfers: Yes Sit to Stand: 4: Min assist;3: Mod assist;With upper extremity assist;From bed Sit to Stand Details (indicate cue type and reason): cues for use of UEs and for LE position Stand to Sit: 4: Min assist;3: Mod assist;With upper extremity assist;To chair/3-in-1;With armrests Stand to Sit Details: cues for use of UEs and for LE position Ambulation/Gait Ambulation/Gait: Yes Ambulation/Gait Assistance: 4: Min assist Ambulation/Gait Assistance Details (indicate cue type and reason): cues for ER on R, position from RW and PWB Ambulation Distance (Feet): 70 Feet Assistive device: Rolling walker Gait Pattern: Step-to pattern    Exercise  Total Joint Exercises Ankle Circles/Pumps: AROM;15 reps;Both;Supine Heel Slides: AAROM;10 reps;Supine;Right Hip ABduction/ADduction: AAROM;10 reps;Supine;Right End of Session PT - End of Session Activity Tolerance: Patient tolerated treatment well Patient left: in chair;with call bell in reach;with family/visitor present Nurse Communication: Mobility status for transfers;Mobility status for ambulation General Behavior During Session: [facility] for tasks performed Cognition: [facility] for tasks performed  Persia Lintner 12/05/2011, 2:17 PM

## 2011-12-06 DIAGNOSIS — E876 Hypokalemia: Secondary | ICD-10-CM | POA: Diagnosis not present

## 2011-12-06 LAB — CBC
Hemoglobin: 9.8 g/dL — ABNORMAL LOW (ref 12.0–15.0)
MCHC: 30.4 g/dL (ref 30.0–36.0)

## 2011-12-06 LAB — BASIC METABOLIC PANEL
GFR calc Af Amer: >90 mL/min (ref >90)
GFR calc non Af Amer: >90 mL/min (ref >90)
Glucose, Bld: 101 mg/dL — ABNORMAL HIGH (ref 70–99)
Potassium: 3.8 mEq/L (ref 3.5–5.1)
Sodium: 135 mEq/L (ref 135–145)

## 2011-12-06 MED ORDER — RIVAROXABAN 10 MG PO TABS: 10.0000 mg | ORAL_TABLET | Freq: Every day | ORAL | Status: DC

## 2011-12-06 MED ORDER — OXYCODONE-ACETAMINOPHEN 10-325 MG PO TABS: 1.0000 | ORAL_TABLET | ORAL | Status: DC | PRN

## 2011-12-06 MED ORDER — GABAPENTIN 400 MG PO CAPS
800.0000 mg | ORAL_CAPSULE | Freq: Four times a day (QID) | ORAL | Status: DC
  Filled 2011-12-06 (×4): qty 2

## 2011-12-06 MED ORDER — METHOCARBAMOL 500 MG PO TABS: 500.0000 mg | ORAL_TABLET | Freq: Four times a day (QID) | ORAL | Status: AC | PRN

## 2011-12-06 MED ORDER — GABAPENTIN 400 MG PO CAPS
800.0000 mg | ORAL_CAPSULE | Freq: Four times a day (QID) | ORAL | Status: DC
  Administered 2011-12-06 – 2011-12-07 (×4): 800 mg via ORAL
  Filled 2011-12-06 (×8): qty 2

## 2011-12-06 MED ORDER — POLYSACCHARIDE IRON 150 MG PO CAPS: 150.0000 mg | ORAL_CAPSULE | Freq: Every day | ORAL | Status: DC

## 2011-12-06 NOTE — Progress Notes (Signed)
Occupational Therapy Evaluation Patient Details Name: [name] MRN: [id number] DOB: October 11, 1951 Today's Date: 12/06/2011 Time in: 9:33 am Time out: 10:07 am Eval II  Problem List:  Patient Active Problem List  Diagnoses  . Postop Acute blood loss anemia  . Postop Hypokalemia    Past Medical History:  Past Medical History  Diagnosis Date  . Osteoporosis   . Hepatitis     dx in 1991   . Hiatal hernia   . Arthritis   . Depression   . Dislocation of right hip   . Dislocation     pt has had multiple dislocations of both hips    Past Surgical History:  Past Surgical History  Procedure Date  . Hip surgery   . Back surgery   . Joint replacement   . Knee surgery   . Other surgical history     6 back surgeries - fusions thoracic to lumbar   . Other surgical history     right femur surgery - 3 surgeries - plate knee to hip   . Hip closed reduction 11/30/2011    Procedure: CLOSED REDUCTION HIP;  Surgeon: Shelda Pal;  Location: [facility];  Service: Orthopedics;  Laterality: Right;    OT Assessment/Plan/Recommendation OT Assessment Clinical Impression Statement: Pt will benefit from OT services to further educate on THPs and importance of adhereing to these for ADL tasks as well as increase her independence levels with these tasks. OT Recommendation/Assessment: Patient will need skilled OT in the acute care venue OT Problem List: Decreased strength;Decreased safety awareness;Decreased knowledge of use of DME or AE;Decreased knowledge of precautions;Pain OT Therapy Diagnosis : Generalized weakness;Acute pain OT Plan OT Frequency: Min 2X/week OT Treatment/Interventions: Self-care/ADL training;Therapeutic activities;DME and/or AE instruction;Patient/family education OT Recommendation Follow Up Recommendations: Home health OT Equipment Recommended: None recommended by OT Individuals Consulted Consulted and Agree with Results and Recommendations: Patient OT Goals Acute  Rehab OT Goals OT Goal Formulation: With patient Time For Goal Achievement: 7 days ADL Goals Pt Will Perform Grooming: with supervision;Standing at sink ADL Goal: Grooming - Progress: Progressing toward goals Pt Will Perform Lower Body Bathing: with supervision;with adaptive equipment;Sit to stand from chair;Sit to stand from bed ADL Goal: Lower Body Bathing - Progress: Progressing toward goals Pt Will Perform Lower Body Dressing: with supervision;with adaptive equipment;Sit to stand from chair;Sit to stand from bed ADL Goal: Lower Body Dressing - Progress: Progressing toward goals Pt Will Transfer to Toilet: with supervision;with DME;3-in-1;Other (comment) (rolling walker) ADL Goal: Toilet Transfer - Progress: Progressing toward goals Pt Will Perform Toileting - Clothing Manipulation: with supervision;Standing ADL Goal: Toileting - Clothing Manipulation - Progress: Progressing toward goals Pt Will Perform Toileting - Hygiene: with supervision;Standing at 3-in-1/toilet ADL Goal: Toileting - Hygiene - Progress: Progressing toward goals  OT Evaluation Precautions/Restrictions  Precautions Precautions: Posterior Hip Precaution Comments: sign hung in room Restrictions Weight Bearing Restrictions: Yes RLE Weight Bearing: Partial weight bearing RLE Partial Weight Bearing Percentage or Pounds: 25-50% Prior Functioning Home Living Lives With: Spouse Receives Help From: Family Type of Home: House Home Layout: One level Home Access: Stairs to enter Entrance Stairs-Rails: Left Entrance Stairs-Number of Steps: 2 Bathroom Shower/Tub: Walk-in shower;Other (comment) (pt states no ledge. fully walk in) [employer]: Handicapped height (states grab bar beside commode) Home Adaptive Equipment: Reacher;Shower chair with back;Bedside commode/3-in-1;Sock aid;Long-handled shoehorn;Long-handled sponge Prior Function Level of Independence: Independent with basic ADLs;Independent with  gait;Independent with transfers ADL ADL Eating/Feeding: Simulated;Independent Where Assessed - Eating/Feeding: Chair Grooming: Performed;Minimal  assistance Grooming Details (indicate cue type and reason): min guard assist and min verbal cues for position of self and RW at sink. Where Assessed - Grooming: Standing at sink Upper Body Bathing: Simulated;Chest;Right arm;Left arm;Abdomen;Set up;Supervision/safety Where Assessed - Upper Body Bathing: Unsupported;Sitting, bed Lower Body Bathing: Moderate assistance;Simulated Lower Body Bathing Details (indicate cue type and reason): without adaptive equipment Where Assessed - Lower Body Bathing: Sit to stand from bed Upper Body Dressing: Simulated;Set up;Supervision/safety Where Assessed - Upper Body Dressing: Sitting, bed;Unsupported Lower Body Dressing: Maximal assistance;Simulated Lower Body Dressing Details (indicate cue type and reason): without adaptive equipment Where Assessed - Lower Body Dressing: Sit to stand from bed Toilet Transfer: Performed;Minimal assistance Toilet Transfer Details (indicate cue type and reason): mod verbal cues for safety to go slower, THPs Toilet Transfer Method: Ambulating Toilet Transfer Equipment: Raised toilet seat with arms (or 3-in-1 over toilet);Other (comment) (RW) Toileting - Clothing Manipulation: Simulated;Minimal assistance Where Assessed - Glass blower/designer Manipulation: Standing Toileting - Hygiene: Performed;Minimal assistance Toileting - Hygiene Details (indicate cue type and reason): min verbal cues for safety with THPs.  Where Assessed - Toileting Hygiene: Standing Tub/Shower Transfer: Not assessed Tub/Shower Transfer Method: Not assessed Equipment Used: Other (comment) (rolling walker) ADL Comments: pt with decreased safety awareness. Had told pt OT would return after letting RN know she was requesting pain medicine. Upon return to room, pt already up on the side of the bed yet had stated  she felt very grogg/sleepy. She tried to stand up before OT had untangled IV line from under her leg and unplugged IV pole from wall. She nees constant verbal cues to ensure adnerence to THPs. She is able  to state all precautions but does not adhere to them with ADL. Vision/Perception  Vision - History Baseline Vision: Wears glasses only for reading Cognition Cognition Arousal/Alertness: Awake/alert Overall Cognitive Status: Impaired (pt with decreased safety awareness/impulsive) Orientation Level: Oriented X4 Safety/Judgement: Decreased awareness of safety precautions;Decreased safety judgement for tasks assessed Decreased Safety/Judgement: Impulsive;Decreased awareness of need for assistance Safety/Judgement - Other Comments: Trying to stand up with IV lines tangled under her feet and IV pole still plugged up.  Sensation/Coordination Sensation Light Touch: Appears Intact Extremity Assessment RUE Assessment RUE Assessment: Within Functional Limits LUE Assessment LUE Assessment: Within Functional Limits Mobility  Transfers Sit to Stand: 4: Min assist Sit to Stand Details (indicate cue type and reason): min guard assist and min verbal cues for THPs. Stand to Sit: 4: Min assist Stand to Sit Details: min guard assist and min verbal cues THPs. Exercises   End of Session OT - End of Session Equipment Utilized During Treatment: Other (comment) (rolling walker) Activity Tolerance: Patient tolerated treatment well Patient left: in chair;with call bell in reach Nurse Communication: Mobility status for transfers General Behavior During Session: [facility] for tasks performed Cognition: Impaired (decreased safety awareness)   Lennox Laity [phone] 12/06/2011, 10:30 AM

## 2011-12-06 NOTE — Progress Notes (Signed)
Physical Therapy Treatment Patient Details Name: SIMONNE BOULOS MRN: [id number] DOB: 03-20-1951 Today's Date: 12/06/2011 13:20-13:40, gt  PT Assessment/Plan  PT - Assessment/Plan Comments on Treatment Session: Pt recalls 3/3 hip precautions, but requires verbal cues to maintain.  Husband telling pt that if she abducted leg and then leaned forward then the hip wouldn't pop out if she had to pick soemthing up.  PT educated pt and husband again on hip precautions and stated that if she needed to pick something up that she would need to use her reacher.  Discussed doing stairs in AM before anticipated d/c.  Also discussed importance of maintaining proper WB status as she began to feel better so that site would heal properly. PT Plan: Discharge plan remains appropriate PT Frequency: 7X/week Follow Up Recommendations: Home health PT Equipment Recommended: None recommended by PT PT Goals  Acute Rehab PT Goals PT Goal: Supine/Side to Sit - Progress: Met PT Goal: Sit to Stand - Progress: Met PT Goal: Stand to Sit - Progress: Progressing toward goal PT Goal: Ambulate - Progress: Progressing toward goal PT Goal: Up/Down Stairs - Progress:  (not addressed)  PT Treatment Precautions/Restrictions  Precautions Precautions: Posterior Hip Precaution Comments: sign hung in room Restrictions Weight Bearing Restrictions: Yes RLE Weight Bearing: Partial weight bearing RLE Partial Weight Bearing Percentage or Pounds: 25-50% Mobility (including Balance) Bed Mobility Supine to Sit: 5: Supervision Transfers Sit to Stand: 5: Supervision Sit to Stand Details (indicate cue type and reason): cues for hip precautions, poor safety, trying to stand before RW present Stand to Sit: 5: Supervision Stand to Sit Details: verbal cues for LE extension and to follow THP Ambulation/Gait Ambulation/Gait Assistance: 5: Supervision Ambulation/Gait Assistance Details (indicate cue type and reason): verbal cues for staying  within BOS of RW, Cues for smaller turn with RW when changing direction Ambulation Distance (Feet): 95 Feet Assistive device: Rolling walker Gait Pattern: Step-to pattern    Exercise  Total Joint Exercises Ankle Circles/Pumps: AROM;10 reps;Seated Gluteal Sets: Strengthening;10 reps;Seated Hip ABduction/ADduction: 5 reps;AROM;Seated End of Session PT - End of Session Activity Tolerance: Patient tolerated treatment well Patient left: in chair;with call bell in reach;with family/visitor present General Behavior During Session: [facility] for tasks performed Cognition: [facility] for tasks performed  [facility] LUBECK 12/06/2011, 1:50 PM

## 2011-12-06 NOTE — Progress Notes (Signed)
Subjective: 2 Days Post-Op Procedure(s) (LRB): TOTAL HIP REVISION (Right) Patient reports pain as mild and moderate.   Patient seen in rounds with Dr. Lequita Halt. Patient has complaints of some bleeding from incision today.  Pretty sore this morning.  Will work with PT and probably home tomorrow.  Objective: Vital signs in last 24 hours: Temp:  [98 F (36.7 C)-98.7 F (37.1 C)] 98 F (36.7 C) (12/14 0545) Pulse Rate:  [83-112] 104  (12/14 0545) Resp:  [14-20] 20  (12/14 0545) BP: (107-125)/(61-78) 110/71 mmHg (12/14 0545) SpO2:  [92 %-94 %] 92 % (12/14 0545)  Intake/Output from previous day:  Intake/Output Summary (Last 24 hours) at 12/06/11 0900 Last data filed at 12/06/11 0545  Gross per 24 hour  Intake   1080 ml  Output   3725 ml  Net  -2645 ml    Intake/Output this shift:    Labs:  Basename 12/06/11 0345 12/05/11 0345  HGB 9.8* 9.8*    Basename 12/06/11 0345 12/05/11 0345  WBC 10.6* 8.9  RBC 3.76* 3.81*  HCT 32.2* 32.5*  PLT 364 374    Basename 12/06/11 0345 12/05/11 0345  NA 135 135  K 3.8 3.3*  CL 105 105  CO2 25 26  BUN 5* 7  CREATININE 0.44* 0.41*  GLUCOSE 101* 114*  CALCIUM 8.2* 7.7*   No results found for this basename: LABPT:2,INR:2 in the last 72 hours  Exam - Neurovascular intact Sensation intact distally Dressing/Incision - clean, some bleeding from incision, reinforce dressing as needed. Motor function intact - moving foot and toes well on exam.   Assessment/Plan: 2 Days Post-Op Procedure(s) (LRB): TOTAL HIP REVISION (Right)  Past Medical History  Diagnosis Date  . Osteoporosis   . Hepatitis     dx in 1991   . Hiatal hernia   . Arthritis   . Depression   . Dislocation of right hip   . Dislocation     pt has had multiple dislocations of both hips     Advance diet Up with therapy Plan for discharge tomorrow  DVT Prophylaxis - Xarelto / Coumadin Protocol Partial-Weight Bearing 25-50% right Leg  PERKINS,  ALEXZANDREW 12/06/2011, 9:00 AM

## 2011-12-06 NOTE — Progress Notes (Signed)
12/06/2011 Raynelle Bring BSN CCM [phone] CM spoke with spouse regarding d/c planning. Pt is dozing. Plans are for patient to return to her home in [place] where spouse will be caregiver. Pt already has all of DME from previous surgeries. Spouse wants agency in network. [facility] in [place] contacted/info faxed-hhpt oders, face sheet, op report, h&p. pt notes; CM will follow

## 2011-12-07 LAB — CBC
HCT: 30.8 % — ABNORMAL LOW (ref 36.0–46.0)
Hemoglobin: 9.6 g/dL — ABNORMAL LOW (ref 12.0–15.0)
MCHC: 31.2 g/dL (ref 30.0–36.0)
RDW: 14.4 % (ref 11.5–15.5)
WBC: 11.7 10*3/uL — ABNORMAL HIGH (ref 4.0–10.5)

## 2011-12-07 MED ORDER — DOXYCYCLINE HYCLATE 50 MG PO CAPS: 100.0000 mg | ORAL_CAPSULE | Freq: Two times a day (BID) | ORAL | Status: AC

## 2011-12-07 NOTE — Plan of Care (Signed)
Problem: Phase II Progression Outcomes Goal: Discharge plan established Pt doing well, home with [facility] remain appropriate d/c plan

## 2011-12-07 NOTE — Progress Notes (Signed)
CM spoke with pt concerning d/c planning. Pt has DME at home. Spouse to assist in home care. Per pt choice [facility] in [place] to provide HHPT. Pt agrees with plan of care. Roxy Manns Kristoffer Bala,RN,BSN,CM [phone]

## 2011-12-07 NOTE — Progress Notes (Signed)
Physical Therapy Treatment Patient Details Name: [name] MRN: [id number] DOB: Jan 04, 1951 Today's Date: 12/07/2011 8:50-9:10, gt PT Assessment/Plan  PT - Assessment/Plan Comments on Treatment Session: Pt recalled 3/3 hip precautions.  pt with improved safety today with gait.  Pt scheduled to be d/c with HHPT today. PT Plan: Discharge plan remains appropriate PT Frequency: 7X/week Follow Up Recommendations: Home health PT Equipment Recommended: None recommended by PT PT Goals  Acute Rehab PT Goals PT Goal: Supine/Side to Sit - Progress: Met PT Goal: Sit to Supine/Side - Progress: Met PT Goal: Sit to Stand - Progress: Met PT Goal: Stand to Sit - Progress: Met PT Goal: Ambulate - Progress: Met PT Goal: Up/Down Stairs - Progress: Met  PT Treatment Precautions/Restrictions  Precautions Precautions: Posterior Hip Precaution Comments: sign hung in room Restrictions Weight Bearing Restrictions: Yes RLE Weight Bearing: Partial weight bearing RLE Partial Weight Bearing Percentage or Pounds: 25-50% Mobility (including Balance) Bed Mobility Supine to Sit: 5: Supervision (cues to keep from IR R hip) Transfers Sit to Stand: 6: Modified independent (Device/Increase time) Sit to Stand Details (indicate cue type and reason): poor safety. standing before RW in front of her. Stand to Sit: 6: Modified independent (Device/Increase time) Ambulation/Gait Ambulation/Gait Assistance: 6: Modified independent (Device/Increase time) Ambulation/Gait Assistance Details (indicate cue type and reason): improved safety with gait today. some cues to increase Wb as pt keeping almost NWB initially Ambulation Distance (Feet): 250 Feet Assistive device: Rolling walker Gait Pattern: Step-to pattern Stairs: Yes Stairs Assistance: 5: Supervision Stair Management Technique: One rail Right;With crutches;Forwards Number of Stairs: 2  (and 3)    Exercise    End of Session PT - End of Session Activity  Tolerance: Patient tolerated treatment well Patient left: in bed;with call bell in reach Nurse Communication: Mobility status for transfers;Mobility status for ambulation General Behavior During Session: [facility] for tasks performed Cognition: [facility] for tasks performed  [facility] LUBECK 12/07/2011, 10:17 AM

## 2011-12-07 NOTE — Progress Notes (Signed)
Subjective:No complaints    Objective: Vital signs in last 24 hours: Temp:  [97.6 F (36.4 C)-99.8 F (37.7 C)] 99.8 F (37.7 C) (12/15 0500) Pulse Rate:  [90-102] 102  (12/15 0500) Resp:  [12-20] 12  (12/15 0500) BP: (105-119)/(67-73) 119/73 mmHg (12/15 0500) SpO2:  [92 %-99 %] 92 % (12/15 0500)  Intake/Output from previous day: 12/14 0701 - 12/15 0700 In: 1080 [P.O.:1080] Out: 525 [Urine:525] Intake/Output this shift:     Basename 12/07/11 0440 12/06/11 0345 12/05/11 0345  HGB 9.6* 9.8* 9.8*    Basename 12/07/11 0440 12/06/11 0345  WBC 11.7* 10.6*  RBC 3.69* 3.76*  HCT 30.8* 32.2*  PLT 426* 364    Basename 12/06/11 0345 12/05/11 0345  NA 135 135  K 3.8 3.3*  CL 105 105  CO2 25 26  BUN 5* 7  CREATININE 0.44* 0.41*  GLUCOSE 101* 114*  CALCIUM 8.2* 7.7*   No results found for this basename: LABPT:2,INR:2 in the last 72 hours  Rt hip wound well approx with staples, Slight serosang d/c no erythema. Thigh and calf soft NT, leg NMVI  Assessment/Plan: POD # 3 Rev Rt THA doing well. Plan Will d/c to home with HHPT, 2 week F/U    Jamelle Rushing 12/07/2011, 8:50 AM

## 2011-12-07 NOTE — Progress Notes (Signed)
Occupational Therapy Treatment Patient Details Name: [name] MRN: [id number] DOB: 03/06/1951 Today's Date: 12/07/2011  OT Assessment/Plan OT Assessment/Plan Comments on Treatment Session: Pt doing well, discharge plan still appropriate OT Plan: Discharge plan remains appropriate OT Frequency: Min 2X/week Follow Up Recommendations: Home health OT Equipment Recommended: None recommended by OT OT Goals Acute Rehab OT Goals OT Goal Formulation: With patient Time For Goal Achievement: 7 days ADL Goals ADL Goal: Grooming - Progress: Progressing toward goals ADL Goal: Lower Body Bathing - Progress: Progressing toward goals ADL Goal: Lower Body Dressing - Progress: Progressing toward goals ADL Goal: Toilet Transfer - Progress: Progressing toward goals ADL Goal: Toileting - Clothing Manipulation - Progress: Progressing toward goals ADL Goal: Toileting - Hygiene - Progress: Progressing toward goals  OT Treatment Precautions/Restrictions  Precautions Precautions: Posterior Hip Restrictions Weight Bearing Restrictions: Yes RLE Weight Bearing: Partial weight bearing RLE Partial Weight Bearing Percentage or Pounds: 25 - 50 %   ADL ADL Eating/Feeding: Not assessed Grooming: Performed;Wash/dry hands;Wash/dry face;Brushing hair (Min guard assist) Where Assessed - Grooming: Standing at sink Upper Body Bathing: Not assessed Lower Body Bathing: Simulated;Moderate assistance Upper Body Dressing: Not assessed Lower Body Dressing: Moderate assistance Where Assessed - Lower Body Dressing: Sit to stand from chair Toilet Transfer: Performed;Other (comment) (Min guard assist) Toilet Transfer Method: Proofreader: Raised toilet seat with arms (or 3-in-1 over toilet) Toileting - Clothing Manipulation: Performed;Other (comment) (Min guard assist) Toileting - Hygiene: Performed;Supervision/safety Where Assessed - Toileting Hygiene: Sit on 3-in-1 or toilet Tub/Shower  Transfer: Not assessed Tub/Shower Transfer Method: Not assessed Equipment Used: Rolling walker;Other (comment) (gait belt) Mobility  Bed Mobility Bed Mobility: No Transfers Transfers: Yes Sit to Stand: 6: Modified independent (Device/Increase time);From chair/3-in-1;From toilet Sit to Stand Details (indicate cue type and reason): poor safety. standing before RW in front of her. Stand to Sit: To chair/3-in-1;To toilet;6: Modified independent (Device/Increase time)   End of Session OT - End of Session Equipment Utilized During Treatment: Gait belt;Other (comment) (RW) Activity Tolerance: Patient tolerated treatment well Patient left: in chair;with call bell in reach General Behavior During Session: [facility] for tasks performed Cognition: [facility] for tasks performed  Galen Manila  12/07/2011, 10:30 AM

## 2011-12-07 NOTE — Progress Notes (Signed)
Patient to be discharged to home with her spouse. Pt is very anxious to go home an dis walking in the halls with her walker. Discharge instructions given to pt and she verbalized understanding and she has already got hh and was talking to them on the phone setting up delivery of her equipment at time of discharge. Sheran Luz RN BSN

## 2011-12-07 NOTE — Progress Notes (Signed)
Patient ID: [name], female   DOB: 09/05/1951, 60 y.o.   MRN: [id number] Due to [place] D/C from hip and Hx of MRSA will cover with Doxycycline for 1 week till drainage stops.

## 2011-12-08 LAB — TYPE AND SCREEN
Antibody Screen: NEGATIVE
Donor AG Type: NEGATIVE
Unit division: 0

## 2011-12-09 NOTE — Progress Notes (Signed)
Discharge summary sent to payer through [facility]  

## 2011-12-11 NOTE — Discharge Summary (Signed)
Physician Discharge Summary   Patient ID: [name] MRN: [id number] DOB/AGE: 02-15-1951 60 y.o.  Admit date: 12/04/2011 Discharge date: 12/07/2011  Primary Diagnosis: Recurrent instability, right total hip   Admission Diagnoses: Past Medical History  Diagnosis Date  . Osteoporosis   . Hepatitis     dx in 1991   . Hiatal hernia   . Arthritis   . Depression   . Dislocation of right hip   . Dislocation     pt has had multiple dislocations of both hips     Discharge Diagnoses:  Active Problems:  Postop Acute blood loss anemia  Postop Hypokalemia   Procedure: Procedure(s) (LRB): TOTAL HIP REVISION (Right)   Consults: none  HPI: [name] is a 60 year old female with a long complex  history in regards to her right hip. Despite numerous efforts to treat  her instability she has had recurrent instability. It was felt that the  best option for her would be the Stryker tripolar system. She presents  now for acetabular revision to the tripolar system.  Laboratory Data: Hospital Outpatient Visit on 12/02/2011  Component Date Value Range Status  . WBC (K/uL) 12/02/2011 11.0* 4.0-10.5 Final  . RBC (MIL/uL) 12/02/2011 4.88  3.87-5.11 Final  . Hemoglobin (g/dL) 91/47/8295 62.1  30.8-65.7 Final  . HCT (%) 12/02/2011 41.3  36.0-46.0 Final  . MCV (fL) 12/02/2011 84.6  78.0-100.0 Final  . MCH (pg) 12/02/2011 26.2  26.0-34.0 Final  . MCHC (g/dL) 84/69/6295 28.4  13.2-44.0 Final  . RDW (%) 12/02/2011 14.4  11.5-15.5 Final  . Platelets (K/uL) 12/02/2011 494* 150-400 Final  . Sodium (mEq/L) 12/02/2011 135  135-145 Final  . Potassium (mEq/L) 12/02/2011 4.0  3.5-5.1 Final  . Chloride (mEq/L) 12/02/2011 98  96-112 Final  . CO2 (mEq/L) 12/02/2011 29  19-32 Final  . Glucose, Bld (mg/dL) 10/19/2535 90  64-40 Final  . BUN (mg/dL) 34/74/2595 14  6-38 Final  . Creatinine, Ser (mg/dL) 75/64/3329 5.18* 8.41-6.60 Final  . Calcium (mg/dL) 63/12/6008 9.6  9.3-23.5 Final  . Total Protein  (g/dL) 57/32/2025 7.8  4.2-7.0 Final  . Albumin (g/dL) 62/37/6283 3.4* 1.5-1.7 Final  . AST (U/L) 12/02/2011 28  0-37 Final  . ALT (U/L) 12/02/2011 25  0-35 Final  . Alkaline Phosphatase (U/L) 12/02/2011 127* 39-117 Final  . Total Bilirubin (mg/dL) 61/60/7371 0.2* 0.6-2.6 Final  . GFR calc non Af Amer (mL/min) 12/02/2011 >90  >90 Final  . GFR calc Af Amer (mL/min) 12/02/2011 >90  >90 Final   Comment:                                 The eGFR has been calculated                          using the CKD EPI equation.                          This calculation has not been                          validated in all clinical                          situations.  eGFR's persistently                          <90 mL/min signify                          possible Chronic Kidney Disease.  Marland Kitchen Prothrombin Time (seconds) 12/02/2011 13.7  11.6-15.2 Final  . INR  12/02/2011 1.03  0.00-1.49 Final  . aPTT (seconds) 12/02/2011 42* 24-37 Final   Comment:                                 IF BASELINE aPTT IS ELEVATED,                          SUGGEST PATIENT RISK ASSESSMENT                          BE USED TO DETERMINE APPROPRIATE                          ANTICOAGULANT THERAPY.   No results found for this basename: HGB:5 in the last 72 hours No results found for this basename: WBC:2,RBC:2,HCT:2,PLT:2 in the last 72 hours No results found for this basename: NA:2,K:2,CL:2,CO2:2,BUN:2,CREATININE:2,GLUCOSE:2,CALCIUM:2 in the last 72 hours No results found for this basename: LABPT:2,INR:2 in the last 72 hours  X-Rays:Dg Hip 1 View Right  11/29/2011  *RADIOLOGY REPORT*  Clinical Data: Postreduction to verify the hip is still dislocated.  RIGHT HIP - 1 VIEW  Comparison: 11/29/2011 at 2133 hours  Findings: Right hip arthroplasty with long stem femoral component, incompletely included on the film.  There is persistent right superolateral dislocation of the femoral prosthesis from the acetabular  prosthesis.  Postoperative changes in the lower lumbosacral spine.  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  Original Report Authenticated By: Marlon Pel, M.D.   Dg Hip Complete Right  11/29/2011  *RADIOLOGY REPORT*  Clinical Data: Right hip pain, right hip replacement  RIGHT HIP - COMPLETE 2+ VIEW  Comparison: 09/07/2011  Findings: Right total hip arthroplasty with posterior dislocation of the femoral component.  Left total hip arthroplasty without evidence of hardware complication.  Posterior fixation with ray cage fusion of the lower lumbar spine.  No fracture or dislocation is seen.  IMPRESSION: Right total hip arthroplasty with posterior dislocation of the femoral component.  Left total hip arthroplasty without evidence of hardware complication.  Original Report Authenticated By: Charline Bills, M.D.   Dg Pelvis Portable  12/04/2011  *RADIOLOGY REPORT*  Clinical Data: Postop right hip surgery  PORTABLE PELVIS  Comparison: 03/30/2009  Findings: Right hip replacement in satisfactory position and alignment.  Revision of the acetabular prosthesis since the prior study.  Left hip replacement in good position.  Pedicle screw and posterior rod fusion in the lumbar spine extending into the sacrum.  IMPRESSION: Satisfactory bilateral hip replacement with revision of the right acetabulum.  Original Report Authenticated By: Camelia Phenes, M.D.   Dg Pelvis Portable  11/30/2011  *RADIOLOGY REPORT*  Clinical Data: Postoperative reduction of the right hip.  PORTABLE PELVIS  Comparison: 03/30/2009.  Findings: On the single AP view of the pelvis, the bilateral total hip arthroplasties appear appropriately located.  Lumbar fixation hardware is again noted.  No fracture is  identified.  Compared to the prior exam, may have been revision of the right acetabular cup and liner with absence of the previously seen retention ring. There may be the lucency  around the sacral spinal fixation screws.  IMPRESSION: Bilateral total hip arthroplasties appear located.  Original Report Authenticated By: Andreas Newport, M.D.    EKG:No orders found for this or any previous visit.   Hospital Course: Patient was admitted to [facility] and taken to the OR and underwent the above state procedure without complications.  Patient tolerated the procedure well and was later transferred to the recovery room and then to the orthopaedic floor for postoperative care.  They were given PO and IV analgesics for pain control following their surgery.  They were given 24 hours of postoperative antibiotics and started on DVT prophylaxis.   PT and OT were ordered for total joint protocol.  Discharge planning consulted to help with postop disposition and equipment needs.  Patient had a rough night on the evening of surgery due to pain and started to get up with therapy on day one.  She was started WBAT.   Hemovac drain was pulled without difficulty.  Continued to progress with therapy into day two.  Dressing was changed on day two and the incision was looking good but did have some serosanguinous drainage.  By day three, the patient had progressed with therapy and meeting goals walking over 200 feet.  Incision was healing well.  Patient was seen in rounds and was ready to go home.  Discharge Medications: Prior to Admission medications   Medication Sig Start Date End Date Taking? Authorizing Provider  ALPRAZolam Prudy Feeler) 1 MG tablet Take 2 mg by mouth at bedtime.    Yes Historical Provider, MD  clonazePAM (KLONOPIN) 0.5 MG tablet Take 0.5 mg by mouth at bedtime.    Yes Historical Provider, MD  FLUoxetine (PROZAC) 20 MG capsule Take 40 mg by mouth every morning.    Yes Historical Provider, MD  gabapentin (NEURONTIN) 800 MG tablet Take 800 mg by mouth 4 (four) times daily.    Yes Historical Provider, MD  omeprazole (PRILOSEC) 20 MG capsule Take 20 mg by mouth daily.    Yes  Historical Provider, MD  doxycycline (VIBRAMYCIN) 50 MG capsule Take 2 capsules (100 mg total) by mouth 2 (two) times daily. 12/07/11 12/17/11  Jamelle Rushing, PA  methocarbamol (ROBAXIN) 500 MG tablet Take 1 tablet (500 mg total) by mouth every 6 (six) hours as needed. 12/06/11 12/16/11  Alexzandrew Perkins, PA  oxyCODONE-acetaminophen (PERCOCET) 10-325 MG per tablet Take 1 tablet by mouth every 4 (four) hours as needed for pain. Pain  12/06/11   Alexzandrew Julien Girt, PA  polysaccharide iron (NIFEREX) 150 MG CAPS capsule Take 1 capsule (150 mg total) by mouth daily. 12/06/11   Alexzandrew Julien Girt, PA  rivaroxaban (XARELTO) 10 MG TABS tablet Take 1 tablet (10 mg total) by mouth daily with breakfast. 12/06/11   Alexzandrew Julien Girt, PA    Diet: heart healthy  Activity:WBAT No bending hip over 90 degrees- A "L" Angle Do not cross legs Do not let foot roll inward  When turning these patients a pillow should be placed between the patient's legs to prevent crossing.  Patients should have the affected knee fully extended when trying to sit or stand from all surfaces to prevent excessive hip flexion.  When ambulating and turning toward the affected side the affected leg should have the toes turned out prior to moving the walker and  the rest of patient's body as to prevent internal rotation/ turning in of the leg.  Abduction pillows are the most effective way to prevent a patient from not crossing legs or turning toes in at rest. If an abduction pillow is not ordered placing a regular pillow length wise between the patient's legs is also an effective reminder.  It is imperative that these precautions be maintained so that the surgical hip does not dislocate.    Follow-up:in 2 weeks  Disposition: Home with husband.  Discharged Condition: good   Discharge Orders    Future Orders Please Complete By Expires   Diet - low sodium heart healthy      Call MD / Call 911      Comments:   If you  experience chest pain or shortness of breath, CALL 911 and be transported to the hospital emergency room.  If you develope a fever above 101 F, pus (white drainage) or increased drainage or redness at the wound, or calf pain, call your surgeon's office.   Constipation Prevention      Comments:   Drink plenty of fluids.  Prune juice may be helpful.  You may use a stool softener, such as Colace (over the counter) 100 mg twice a day.  Use MiraLax (over the counter) for constipation as needed.   Increase activity slowly as tolerated      Weight Bearing as taught in Physical Therapy      Comments:   Use a walker or crutches as instructed.   Discharge instructions      Comments:   Pick up stool softner and laxative for home. Do not submerge incision under water. May shower. Continue to use ice for pain and swelling from surgery. Hip precautions.  Total Hip Protocol.   Driving restrictions      Comments:   No driving   Lifting restrictions      Comments:   No lifting   Follow the hip precautions as taught in Physical Therapy      Change dressing      Comments:   You may change your dressing daily with sterile 4 x 4 inch gauze dressing and paper tape. Reinforce as needed.   TED hose      Comments:   Use stockings (TED hose) for 3 weeks on both leg(s).  You may remove them at night for sleeping.     Discharge Medication List as of 12/07/2011 11:39 AM    START taking these medications   Details  doxycycline (VIBRAMYCIN) 50 MG capsule Take 2 capsules (100 mg total) by mouth 2 (two) times daily., Starting 12/07/2011, Until Tue 12/17/11, Print    methocarbamol (ROBAXIN) 500 MG tablet Take 1 tablet (500 mg total) by mouth every 6 (six) hours as needed., Starting 12/06/2011, Until Mon 12/16/11, Print    polysaccharide iron (NIFEREX) 150 MG CAPS capsule Take 1 capsule (150 mg total) by mouth daily., Starting 12/06/2011, Until Discontinued, Print    rivaroxaban (XARELTO) 10 MG TABS tablet  Take 1 tablet (10 mg total) by mouth daily with breakfast., Starting 12/06/2011, Until Discontinued, Print      CONTINUE these medications which have CHANGED   Details  oxyCODONE-acetaminophen (PERCOCET) 10-325 MG per tablet Take 1 tablet by mouth every 4 (four) hours as needed for pain. Pain , Starting 12/06/2011, Until Discontinued, Print      CONTINUE these medications which have NOT CHANGED   Details  ALPRAZolam (XANAX) 1 MG tablet Take 2 mg by  mouth at bedtime. , Until Discontinued, Historical Med    clonazePAM (KLONOPIN) 0.5 MG tablet Take 0.5 mg by mouth at bedtime. , Until Discontinued, Historical Med    FLUoxetine (PROZAC) 20 MG capsule Take 40 mg by mouth every morning. , Until Discontinued, Historical Med    gabapentin (NEURONTIN) 800 MG tablet Take 800 mg by mouth 4 (four) times daily. , Until Discontinued, Historical Med    omeprazole (PRILOSEC) 20 MG capsule Take 20 mg by mouth daily. , Until Discontinued, Historical Med      STOP taking these medications     Calcium Carbonate-Vitamin D (CALCIUM + D PO)      cholecalciferol (VITAMIN D) 1000 UNITS tablet      fish oil-omega-3 fatty acids 1000 MG capsule      Multiple Vitamins-Minerals (MULTIVITAMINS THER. W/MINERALS) TABS        Follow-up Information    Follow up with ALUISIO,FRANK V. Make an appointment in 2 weeks.   Contact information:   [facility] [address] [place] [address] [phone]       Follow up with [facility]. (Home Physical Therapy)    Contact information:   [phone]         Signed: Patrica Duel 12/11/2011, 12:38 PM

## 2011-12-20 ENCOUNTER — Encounter (HOSPITAL_COMMUNITY): Payer: Self-pay | Admitting: Orthopedic Surgery

## 2013-11-16 ENCOUNTER — Other Ambulatory Visit: Payer: Self-pay | Admitting: Orthopedic Surgery

## 2013-11-16 ENCOUNTER — Encounter (HOSPITAL_COMMUNITY)
Admission: RE | Admit: 2013-11-16 | Discharge: 2013-11-16 | Disposition: A | Discharge status: Home / Self Care | Payer: BC Managed Care – PPO | Source: Ambulatory Visit | Attending: Orthopedic Surgery | Admitting: Orthopedic Surgery

## 2013-11-16 ENCOUNTER — Encounter (HOSPITAL_COMMUNITY): Payer: Self-pay | Admitting: Pharmacy Technician

## 2013-11-16 ENCOUNTER — Ambulatory Visit (HOSPITAL_COMMUNITY)
Admission: RE | Admit: 2013-11-16 | Discharge: 2013-11-16 | Disposition: A | Discharge status: Home / Self Care | Payer: BC Managed Care – PPO | Source: Ambulatory Visit | Attending: Orthopedic Surgery | Admitting: Orthopedic Surgery

## 2013-11-16 ENCOUNTER — Encounter (HOSPITAL_COMMUNITY): Payer: Self-pay

## 2013-11-16 ENCOUNTER — Encounter (INDEPENDENT_AMBULATORY_CARE_PROVIDER_SITE_OTHER): Payer: Self-pay

## 2013-11-16 DIAGNOSIS — Z96649 Presence of unspecified artificial hip joint: Secondary | ICD-10-CM | POA: Insufficient documentation

## 2013-11-16 DIAGNOSIS — M25559 Pain in unspecified hip: Secondary | ICD-10-CM | POA: Insufficient documentation

## 2013-11-16 HISTORY — DX: Other disturbances of skin sensation: R20.8

## 2013-11-16 HISTORY — DX: Anxiety disorder, unspecified: F41.9

## 2013-11-16 HISTORY — DX: Personal history of Methicillin resistant Staphylococcus aureus infection: Z86.14

## 2013-11-16 HISTORY — DX: Tinnitus, unspecified ear: H93.19

## 2013-11-16 HISTORY — DX: Gastro-esophageal reflux disease without esophagitis: K21.9

## 2013-11-16 LAB — URINALYSIS, ROUTINE W REFLEX MICROSCOPIC
Bilirubin Urine: NEGATIVE
Hgb urine dipstick: NEGATIVE
Ketones, ur: NEGATIVE mg/dL
Nitrite: NEGATIVE
Protein, ur: NEGATIVE mg/dL
Specific Gravity, Urine: 1.016 (ref 1.005–1.030)
Urobilinogen, UA: 0.2 mg/dL (ref 0.0–1.0)

## 2013-11-16 LAB — SURGICAL PCR SCREEN
MRSA, PCR: NEGATIVE
Staphylococcus aureus: NEGATIVE

## 2013-11-16 NOTE — Patient Instructions (Signed)
YOUR SURGERY IS SCHEDULED AT [facility]  ON:  Wednesday  12/3  REPORT TO  SHORT STAY CENTER AT:  11:00 AM      PHONE # FOR SHORT STAY IS [phone]  DO NOT EAT  ANYTHING AFTER MIDNIGHT THE NIGHT BEFORE YOUR SURGERY.  NO FOOD, NO CHEWING GUM, NO MINTS, NO CANDIES, NO CHEWING TOBACCO. YOU MAY HAVE CLEAR LIQUIDS TO DRINK FROM MIDNIGHT UNTIL 7:30 AM THE DAY OF SURGERY  -  LIKE WATER, COFFEE, TEA  ( JUST NO MILK OR MILK PRODUCTS ).   NOTHING TO DRINK AFTER 7:30 AM.  PLEASE TAKE THE FOLLOWING MEDICATIONS THE AM OF YOUR SURGERY WITH A FEW SIPS OF WATER:  PROZAC, NEURONTIN, PRILOSEC, OXYCODONE / ACETAMINOPHEN FOR PAIN IF NEEDED.    DO NOT BRING VALUABLES, MONEY, CREDIT CARDS.  DO NOT WEAR JEWELRY, MAKE-UP, NAIL POLISH AND NO METAL PINS OR CLIPS IN YOUR HAIR. CONTACT LENS, DENTURES / PARTIALS, GLASSES SHOULD NOT BE WORN TO SURGERY AND IN MOST CASES-HEARING AIDS WILL NEED TO BE REMOVED.  BRING YOUR GLASSES CASE, ANY EQUIPMENT NEEDED FOR YOUR CONTACT LENS. FOR PATIENTS ADMITTED TO THE HOSPITAL--CHECK OUT TIME THE DAY OF DISCHARGE IS 11:00 AM.  ALL INPATIENT ROOMS ARE PRIVATE - WITH BATHROOM, TELEPHONE, TELEVISION AND WIFI INTERNET.  IF YOU ARE BEING DISCHARGED THE SAME DAY OF YOUR SURGERY--YOU CAN NOT DRIVE YOURSELF HOME--AND SHOULD NOT GO HOME ALONE BY TAXI OR BUS.  NO DRIVING OR OPERATING MACHINERY, DO NOT MAKE LEGAL DECISIONS FOR 24 HOURS FOLLOWING ANESTHESIA / PAIN MEDICATIONS.  PLEASE MAKE ARRANGEMENTS FOR SOMEONE TO BE WITH YOU AT HOME THE FIRST 24 HOURS AFTER SURGERY. RESPONSIBLE DRIVER'S NAME / PHONE                                                   PLEASE READ OVER ANY  FACT SHEETS THAT YOU WERE GIVEN: MRSA INFORMATION, BLOOD TRANSFUSION INFORMATION, INCENTIVE SPIROMETER INFORMATION.  FAILURE TO FOLLOW THESE INSTRUCTIONS MAY RESULT IN THE CANCELLATION OF YOUR SURGERY. PLEASE BE AWARE THAT YOU MAY NEED ADDITIONAL BLOOD DRAWN DAY OF YOUR SURGERY  PATIENT  SIGNATURE_________________________________

## 2013-11-16 NOTE — Pre-Procedure Instructions (Signed)
PT HAS CBC, CMET, PT, PTT  AND EKG REPORTS ON HER CHART -DONE 11/03/13, HAS MEDICAL CLEARANCE - ALL FROM K. WRIGHT, PA WITH [facility].  UA AND T/S AND CXR WERE DONE TODAY AT [facility].  CXR NOT NEEDED PER ANESTHESIOLOGIST'S GUIDELINES.

## 2013-11-23 ENCOUNTER — Other Ambulatory Visit: Payer: Self-pay | Admitting: Orthopedic Surgery

## 2013-11-23 NOTE — H&P (Signed)
[name]  DOB: [birth year] Married / Language: English / Race: White Female  Date of Admission:  11/24/2013  Chief Complaint:  Left Hip Pain  History of Present Illness The patient is a 62 year old female who comes in for a preoperative History and Physical. The patient is scheduled for a left acetabular revision versus total hip revision to be performed by Dr. Gus Rankin. Aluisio, MD at [facility] on 12/01/2013. The patient is a 62 year old female who presents for follow up of their hip. The patient is being followed for their bilateral total hip revisions (left 07/18/2006 & right 12/04/2011). Symptoms reported today include: pain and swelling (on lateral side and groin of the left hip, that is know starting to radiate down the leg, states it is getting progressively worse over time). Note for "Follow-up Hip": Overall the right hip is doing well [name] states she started to develop a discomfort and swelling back in July. She has a complex history in regards to both hips. The left hip has been replaced and revised. She did have a constrained liner due to instability. She feels as though the right side is doing fine. The left side started getting swollen and painful in July. She said this was the first available appointment after she had called. She denies any fevers, chills or infectious type symptoms. She did not get any grinding in the joint. 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 It is felt that she would require acetabular revision versus full revision of the left hip. They have been treated conservatively in the past for the above stated problem and despite conservative measures, they continue to have progressive pain and severe functional limitations and dysfunction. They have failed non-operative management including home exercise, medications, and injections. It is felt that they would benefit from undergoing total joint  replacement. Risks and benefits of the procedure have been discussed with the patient and they elect to proceed with surgery. There are no active contraindications to surgery such as ongoing infection or rapidly progressive neurological disease.   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 Nausea, Vomiting. Morphine Derivatives. Itching.    Family History Father. Deceased, Chronic Renal Failure Syndrome, Colon Cancer, Heart disease. Age 66, Was on dialysis. Bladder Cancer. Mother. Deceased, Postmenopausal Osteoporosis. Age 97, peforated colon. Osteoporosis. mother Osteoarthritis. Mother. mother    Social History Exercise. Exercises never Children. 1 Marital status. married Post-Surgical Plans. Plan to go Home. Tobacco use. Current every day smoker. current every day smoker; smoke(d) 1 pack(s) per day Living situation. live with spouse Number of flights of stairs before winded. 2-3 Current work status. working part time [employer]. Living Will and Healthcare POA Alcohol use. Occasional alcohol use. current drinker; drinks wine; only occasionally per week    Medication History ALPRAZolam (0.5MG  Tablet, Oral) Active. KlonoPIN (0.5MG  Tablet, Oral) Active. PROzac (20MG  Capsule, Oral) Active. Neurontin ( Oral) Specific dose unknown - Active. Omeprazole (20MG  Capsule DR, Oral) Active. Percocet ( Oral) Specific dose unknown - Active.   Past Surgical History Arthroscopy of Knee. right Breast Mass; Local Excision. left Carpal Tunnel Repair. bilateral Total Hip Replacement. bilateral Spinal Fusion. lower back Spinal Surgery Tonsillectomy   Medical History Degenerative Disc Disease Hiatal Hernia Osteoporosis S/P irrigation and debridement right hip Pneumonia. Past History Anxiety Disorder History of ARDS (acute respiratory distress syndrome) following prev. 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 Not Present- Chills, Fever, Appetite Loss, Fatigue, Feeling sick, Weight Gain and Weight Loss. Skin:Not Present- Itching, Rash, Skin Color Changes, Ulcer, Psoriasis and Change in Hair or Nails. HEENT:Present- Ringing in the Ears. Not Present- Sensitivity to light, Hearing problems and Nose Bleed. Neck:Not Present- Swollen Glands and Neck Mass. Respiratory:Not Present- Snoring, Chronic Cough, Bloody sputum and Dyspnea. Cardiovascular:Not Present- Shortness of Breath, Chest Pain, Swelling of Extremities, Leg Cramps and Palpitations. Gastrointestinal:Not Present- Bloody Stool, Heartburn, Abdominal Pain, Vomiting, Nausea and Incontinence of Stool. Female Genitourinary:Not Present- Blood in Urine, Menstrual Irregularities, Frequency, Incontinence and Nocturia. Musculoskeletal:Present- Joint Pain, Back Pain and Spasms. Not Present- Muscle Weakness, Muscle Pain, Joint Stiffness and Joint Swelling. Neurological:Not Present- Tingling, Numbness, Burning, Tremor, Headaches and Dizziness. Psychiatric:Not Present- Anxiety, Depression and Memory Loss. Endocrine:Not Present- Cold Intolerance, Heat Intolerance, Excessive hunger and Excessive Thirst. Hematology:Not Present- Abnormal Bleeding, Anemia, Blood Clots and Easy Bruising.    Vitals Weight: 106 lb Height: 60 in Body Surface Area: 1.43 m Body Mass Index: 20.7 kg/m Pulse: 88 (Regular) Resp.: 16 (Unlabored) BP: 108/64 (Sitting, Left Arm, Standard)     Physical Exam The physical exam findings are as follows:  Note: Patient is a 62 year old white, petite framed female with continued right hip problems.   General Mental Status - Alert, cooperative and good historian. General Appearance- pleasant. Not in acute distress. Orientation- Oriented X3. Build &  Nutrition- Lean, [place], Well nourished and Well developed.   Head and Neck Head- normocephalic, atraumatic . Neck Global Assessment- supple. no bruit auscultated on the right and no bruit auscultated on the left.   Eye Pupil- Bilateral- Regular and Round. Motion- Bilateral- EOMI.   Chest and Lung Exam Auscultation: Breath sounds:- clear at anterior chest wall and - clear at posterior chest wall. Adventitious sounds:- No Adventitious sounds.   Cardiovascular Auscultation:Rhythm- Regular rate and rhythm. Heart Sounds- S1 WNL and S2 WNL. Murmurs & Other Heart Sounds: Murmur 1:Location- Aortic Area and Sternal Border - Left. Timing- Holosystolic. Grade- III/VI.   Abdomen Palpation/Percussion:Palpation and Percussion of the abdomen reveal - Non Tender and Soft. Note: Flat Rigidity (guarding)- Abdomen is soft. Auscultation:Auscultation of the abdomen reveals - Bowel sounds normal.   Female Genitourinary Not done, not pertinent to present illness  Musculoskeletal She is in no distress. There is some swelling laterally about the left proximal thigh. I can flex her hip to 100, rotate in 20, out 30 and abduct 30 with some discomfort on hip flexion. The right hip has a pain free range of motion.  RADIOGRAPHS: AP pelvis , AP and lateral of both hips show the constrained liner on the left. The hip is in good position. I do not see any definitive periprosthetic abnormalities.  The right hip shows the long stem prosthesis from the previous fracture. Everything is in good position with no abnormalities.   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 Lequita Halt.  Plan is to go home.  PCP - Dr. Berenice Bouton - Patient has been seen preoperatively and felt to be stable for surgery.  The patient does not have any contraindications and will receive TXA (tranexamic  acid) prior to surgery.  Signed electronically by Lauraine Rinne, III PA-C

## 2013-11-24 ENCOUNTER — Inpatient Hospital Stay (HOSPITAL_COMMUNITY)
Admission: RE | Admit: 2013-11-24 | Discharge: 2013-11-29 | DRG: 464 | Disposition: A | Discharge status: Home Health | Payer: BC Managed Care – PPO | Source: Ambulatory Visit | Attending: Orthopedic Surgery | Admitting: Orthopedic Surgery

## 2013-11-24 ENCOUNTER — Encounter (HOSPITAL_COMMUNITY): Payer: BC Managed Care – PPO | Admitting: Certified Registered Nurse Anesthetist

## 2013-11-24 ENCOUNTER — Encounter (HOSPITAL_COMMUNITY)
Admission: RE | Disposition: A | Discharge status: Home Health | Payer: Self-pay | Source: Ambulatory Visit | Attending: Orthopedic Surgery

## 2013-11-24 ENCOUNTER — Encounter (HOSPITAL_COMMUNITY): Payer: Self-pay | Admitting: *Deleted

## 2013-11-24 ENCOUNTER — Inpatient Hospital Stay (HOSPITAL_COMMUNITY): Payer: BC Managed Care – PPO

## 2013-11-24 ENCOUNTER — Ambulatory Visit (HOSPITAL_COMMUNITY): Payer: BC Managed Care – PPO | Admitting: Certified Registered Nurse Anesthetist

## 2013-11-24 DIAGNOSIS — M169 Osteoarthritis of hip, unspecified: Secondary | ICD-10-CM | POA: Diagnosis present

## 2013-11-24 DIAGNOSIS — Z96649 Presence of unspecified artificial hip joint: Secondary | ICD-10-CM

## 2013-11-24 DIAGNOSIS — Z8052 Family history of malignant neoplasm of bladder: Secondary | ICD-10-CM

## 2013-11-24 DIAGNOSIS — M1610 Unilateral primary osteoarthritis, unspecified hip: Secondary | ICD-10-CM | POA: Diagnosis present

## 2013-11-24 DIAGNOSIS — F17200 Nicotine dependence, unspecified, uncomplicated: Secondary | ICD-10-CM | POA: Diagnosis present

## 2013-11-24 DIAGNOSIS — Z8262 Family history of osteoporosis: Secondary | ICD-10-CM

## 2013-11-24 DIAGNOSIS — T8452XA Infection and inflammatory reaction due to internal left hip prosthesis, initial encounter: Secondary | ICD-10-CM

## 2013-11-24 DIAGNOSIS — Z981 Arthrodesis status: Secondary | ICD-10-CM

## 2013-11-24 DIAGNOSIS — Y831 Surgical operation with implant of artificial internal device as the cause of abnormal reaction of the patient, or of later complication, without mention of misadventure at the time of the procedure: Secondary | ICD-10-CM | POA: Diagnosis present

## 2013-11-24 DIAGNOSIS — Z8614 Personal history of Methicillin resistant Staphylococcus aureus infection: Secondary | ICD-10-CM

## 2013-11-24 DIAGNOSIS — F3289 Other specified depressive episodes: Secondary | ICD-10-CM | POA: Diagnosis present

## 2013-11-24 DIAGNOSIS — Z8701 Personal history of pneumonia (recurrent): Secondary | ICD-10-CM

## 2013-11-24 DIAGNOSIS — T8452XD Infection and inflammatory reaction due to internal left hip prosthesis, subsequent encounter: Secondary | ICD-10-CM

## 2013-11-24 DIAGNOSIS — K219 Gastro-esophageal reflux disease without esophagitis: Secondary | ICD-10-CM | POA: Diagnosis present

## 2013-11-24 DIAGNOSIS — Z79899 Other long term (current) drug therapy: Secondary | ICD-10-CM

## 2013-11-24 DIAGNOSIS — F329 Major depressive disorder, single episode, unspecified: Secondary | ICD-10-CM | POA: Diagnosis present

## 2013-11-24 DIAGNOSIS — E876 Hypokalemia: Secondary | ICD-10-CM | POA: Diagnosis present

## 2013-11-24 DIAGNOSIS — K739 Chronic hepatitis, unspecified: Secondary | ICD-10-CM | POA: Diagnosis present

## 2013-11-24 DIAGNOSIS — Z800 Family history of malignant neoplasm of digestive organs: Secondary | ICD-10-CM

## 2013-11-24 DIAGNOSIS — D62 Acute posthemorrhagic anemia: Secondary | ICD-10-CM | POA: Diagnosis not present

## 2013-11-24 DIAGNOSIS — Z8249 Family history of ischemic heart disease and other diseases of the circulatory system: Secondary | ICD-10-CM

## 2013-11-24 DIAGNOSIS — T84018A Broken internal joint prosthesis, other site, initial encounter: Secondary | ICD-10-CM

## 2013-11-24 DIAGNOSIS — Z880 Allergy status to penicillin: Secondary | ICD-10-CM

## 2013-11-24 DIAGNOSIS — M810 Age-related osteoporosis without current pathological fracture: Secondary | ICD-10-CM | POA: Diagnosis present

## 2013-11-24 DIAGNOSIS — E871 Hypo-osmolality and hyponatremia: Secondary | ICD-10-CM | POA: Diagnosis not present

## 2013-11-24 DIAGNOSIS — T8450XA Infection and inflammatory reaction due to unspecified internal joint prosthesis, initial encounter: Principal | ICD-10-CM | POA: Diagnosis present

## 2013-11-24 DIAGNOSIS — F411 Generalized anxiety disorder: Secondary | ICD-10-CM | POA: Diagnosis present

## 2013-11-24 HISTORY — PX: TOTAL HIP REVISION: SHX763

## 2013-11-24 LAB — GRAM STAIN

## 2013-11-24 LAB — TYPE AND SCREEN
ABO/RH(D): O POS
Antibody Screen: NEGATIVE

## 2013-11-24 SURGERY — TOTAL HIP REVISION
Anesthesia: General | Site: Hip | Laterality: Left

## 2013-11-24 MED ORDER — FENTANYL CITRATE 0.05 MG/ML IJ SOLN
INTRAMUSCULAR | Status: AC
  Filled 2013-11-24: qty 5

## 2013-11-24 MED ORDER — PHENYLEPHRINE 40 MCG/ML (10ML) SYRINGE FOR IV PUSH (FOR BLOOD PRESSURE SUPPORT)
PREFILLED_SYRINGE | INTRAVENOUS | Status: AC
  Filled 2013-11-24: qty 20

## 2013-11-24 MED ORDER — CLONAZEPAM 0.5 MG PO TABS: 0.5000 mg | ORAL_TABLET | Freq: Every day | ORAL | Status: DC

## 2013-11-24 MED ORDER — FENTANYL CITRATE 0.05 MG/ML IJ SOLN
INTRAMUSCULAR | Status: AC
  Filled 2013-11-24: qty 2

## 2013-11-24 MED ORDER — ACETAMINOPHEN 500 MG PO TABS
1000.0000 mg | ORAL_TABLET | Freq: Four times a day (QID) | ORAL | Status: AC
  Administered 2013-11-24 – 2013-11-25 (×3): 1000 mg via ORAL
  Filled 2013-11-24 (×5): qty 2

## 2013-11-24 MED ORDER — ONDANSETRON HCL 4 MG/2ML IJ SOLN: 4.0000 mg | Freq: Four times a day (QID) | INTRAMUSCULAR | Status: DC | PRN

## 2013-11-24 MED ORDER — FENTANYL CITRATE 0.05 MG/ML IJ SOLN
INTRAMUSCULAR | Status: DC | PRN
  Administered 2013-11-24 (×2): 75 ug via INTRAVENOUS
  Administered 2013-11-24 (×2): 100 ug via INTRAVENOUS

## 2013-11-24 MED ORDER — PROPOFOL 10 MG/ML IV BOLUS
INTRAVENOUS | Status: AC
  Filled 2013-11-24: qty 20

## 2013-11-24 MED ORDER — HYDROMORPHONE HCL PF 2 MG/ML IJ SOLN
INTRAMUSCULAR | Status: AC
  Filled 2013-11-24: qty 1

## 2013-11-24 MED ORDER — PHENYLEPHRINE HCL 10 MG/ML IJ SOLN
INTRAMUSCULAR | Status: AC
  Filled 2013-11-24: qty 1

## 2013-11-24 MED ORDER — ALPRAZOLAM 1 MG PO TABS
2.0000 mg | ORAL_TABLET | Freq: Every day | ORAL | Status: DC
  Administered 2013-11-24 – 2013-11-28 (×5): 2 mg via ORAL
  Filled 2013-11-24 (×6): qty 2

## 2013-11-24 MED ORDER — TOBRAMYCIN SULFATE 1.2 G IJ SOLR
INTRAMUSCULAR | Status: AC
  Filled 2013-11-24: qty 3.6

## 2013-11-24 MED ORDER — VANCOMYCIN HCL 1000 MG IV SOLR
INTRAVENOUS | Status: DC | PRN
  Administered 2013-11-24: 2000 mg

## 2013-11-24 MED ORDER — CEFAZOLIN SODIUM-DEXTROSE 2-3 GM-% IV SOLR
2.0000 g | INTRAVENOUS | Status: AC
  Administered 2013-11-24: 2 g via INTRAVENOUS

## 2013-11-24 MED ORDER — DEXAMETHASONE 6 MG PO TABS
10.0000 mg | ORAL_TABLET | Freq: Every day | ORAL | Status: AC
  Filled 2013-11-24: qty 1

## 2013-11-24 MED ORDER — BISACODYL 10 MG RE SUPP: 10.0000 mg | Freq: Every day | RECTAL | Status: DC | PRN

## 2013-11-24 MED ORDER — MENTHOL 3 MG MT LOZG: 1.0000 | LOZENGE | OROMUCOSAL | Status: DC | PRN

## 2013-11-24 MED ORDER — ACETAMINOPHEN 10 MG/ML IV SOLN
1000.0000 mg | Freq: Once | INTRAVENOUS | Status: AC
  Administered 2013-11-24: 1000 mg via INTRAVENOUS
  Filled 2013-11-24: qty 100

## 2013-11-24 MED ORDER — ROCURONIUM BROMIDE 100 MG/10ML IV SOLN
INTRAVENOUS | Status: DC | PRN
  Administered 2013-11-24 (×3): 10 mg via INTRAVENOUS
  Administered 2013-11-24: 40 mg via INTRAVENOUS

## 2013-11-24 MED ORDER — SODIUM CHLORIDE 0.9 % IV SOLN
INTRAVENOUS | Status: DC
  Administered 2013-11-24 – 2013-11-25 (×2): via INTRAVENOUS

## 2013-11-24 MED ORDER — GLYCOPYRROLATE 0.2 MG/ML IJ SOLN
INTRAMUSCULAR | Status: DC | PRN
  Administered 2013-11-24: 0.4 mg via INTRAVENOUS

## 2013-11-24 MED ORDER — HYDROMORPHONE HCL PF 1 MG/ML IJ SOLN
INTRAMUSCULAR | Status: AC
  Filled 2013-11-24: qty 1

## 2013-11-24 MED ORDER — DIPHENHYDRAMINE HCL 50 MG/ML IJ SOLN
INTRAMUSCULAR | Status: AC
  Filled 2013-11-24: qty 1

## 2013-11-24 MED ORDER — CEFAZOLIN SODIUM-DEXTROSE 2-3 GM-% IV SOLR
INTRAVENOUS | Status: AC
  Filled 2013-11-24: qty 50

## 2013-11-24 MED ORDER — CHLORHEXIDINE GLUCONATE 4 % EX LIQD: 60.0000 mL | Freq: Once | CUTANEOUS | Status: DC

## 2013-11-24 MED ORDER — NEOSTIGMINE METHYLSULFATE 1 MG/ML IJ SOLN
INTRAMUSCULAR | Status: AC
  Filled 2013-11-24: qty 10

## 2013-11-24 MED ORDER — ROCURONIUM BROMIDE 100 MG/10ML IV SOLN
INTRAVENOUS | Status: AC
  Filled 2013-11-24: qty 1

## 2013-11-24 MED ORDER — DEXAMETHASONE SODIUM PHOSPHATE 10 MG/ML IJ SOLN
10.0000 mg | Freq: Once | INTRAMUSCULAR | Status: AC
  Administered 2013-11-24: 10 mg via INTRAVENOUS

## 2013-11-24 MED ORDER — LACTATED RINGERS IV SOLN
INTRAVENOUS | Status: DC
  Administered 2013-11-24: 17:00:00 via INTRAVENOUS
  Administered 2013-11-24: 1000 mL via INTRAVENOUS
  Administered 2013-11-24 (×2): via INTRAVENOUS

## 2013-11-24 MED ORDER — VANCOMYCIN HCL 1000 MG IV SOLR
INTRAVENOUS | Status: AC
  Filled 2013-11-24: qty 3000

## 2013-11-24 MED ORDER — HYDROMORPHONE HCL PF 1 MG/ML IJ SOLN
INTRAMUSCULAR | Status: DC | PRN
  Administered 2013-11-24: 1 mg via INTRAVENOUS
  Administered 2013-11-24 (×2): 0.5 mg via INTRAVENOUS

## 2013-11-24 MED ORDER — SODIUM CHLORIDE 0.9 % IV SOLN: INTRAVENOUS | Status: DC

## 2013-11-24 MED ORDER — DEXTROSE 5 % IV SOLN
500.0000 mg | Freq: Four times a day (QID) | INTRAVENOUS | Status: DC | PRN
  Administered 2013-11-24: 500 mg via INTRAVENOUS
  Filled 2013-11-24: qty 5

## 2013-11-24 MED ORDER — SODIUM CHLORIDE 0.9 % IJ SOLN
INTRAMUSCULAR | Status: DC | PRN
  Administered 2013-11-24: 30 mL via INTRAVENOUS

## 2013-11-24 MED ORDER — GLYCOPYRROLATE 0.2 MG/ML IJ SOLN
INTRAMUSCULAR | Status: AC
  Filled 2013-11-24: qty 2

## 2013-11-24 MED ORDER — DIPHENHYDRAMINE HCL 12.5 MG/5ML PO ELIX: 12.5000 mg | ORAL_SOLUTION | ORAL | Status: DC | PRN

## 2013-11-24 MED ORDER — CLONAZEPAM 0.5 MG PO TABS
0.5000 mg | ORAL_TABLET | Freq: Every evening | ORAL | Status: DC | PRN
  Administered 2013-11-25: 0.5 mg via ORAL
  Filled 2013-11-24: qty 1

## 2013-11-24 MED ORDER — PROPOFOL 10 MG/ML IV BOLUS
INTRAVENOUS | Status: DC | PRN
  Administered 2013-11-24: 150 mg via INTRAVENOUS

## 2013-11-24 MED ORDER — FENTANYL CITRATE 0.05 MG/ML IJ SOLN
25.0000 ug | INTRAMUSCULAR | Status: DC | PRN
  Administered 2013-11-24 (×2): 25 ug via INTRAVENOUS

## 2013-11-24 MED ORDER — RIVAROXABAN 10 MG PO TABS
10.0000 mg | ORAL_TABLET | Freq: Every day | ORAL | Status: DC
  Administered 2013-11-25 – 2013-11-29 (×5): 10 mg via ORAL
  Filled 2013-11-24 (×7): qty 1

## 2013-11-24 MED ORDER — MIDAZOLAM HCL 2 MG/2ML IJ SOLN
INTRAMUSCULAR | Status: AC
  Filled 2013-11-24: qty 2

## 2013-11-24 MED ORDER — METOCLOPRAMIDE HCL 5 MG/ML IJ SOLN: 5.0000 mg | Freq: Three times a day (TID) | INTRAMUSCULAR | Status: DC | PRN

## 2013-11-24 MED ORDER — PROMETHAZINE HCL 25 MG/ML IJ SOLN: 6.2500 mg | INTRAMUSCULAR | Status: DC | PRN

## 2013-11-24 MED ORDER — ACETAMINOPHEN 325 MG PO TABS: 650.0000 mg | ORAL_TABLET | Freq: Four times a day (QID) | ORAL | Status: DC | PRN

## 2013-11-24 MED ORDER — DIPHENHYDRAMINE HCL 50 MG/ML IJ SOLN
12.5000 mg | Freq: Once | INTRAMUSCULAR | Status: AC
  Administered 2013-11-24: 12.5 mg via INTRAVENOUS

## 2013-11-24 MED ORDER — POLYETHYLENE GLYCOL 3350 17 G PO PACK
17.0000 g | PACK | Freq: Every day | ORAL | Status: DC | PRN
  Administered 2013-11-25: 17 g via ORAL
  Filled 2013-11-24: qty 1

## 2013-11-24 MED ORDER — NEOSTIGMINE METHYLSULFATE 1 MG/ML IJ SOLN
INTRAMUSCULAR | Status: DC | PRN
  Administered 2013-11-24: 3 mg via INTRAVENOUS

## 2013-11-24 MED ORDER — BUPIVACAINE LIPOSOME 1.3 % IJ SUSP
INTRAMUSCULAR | Status: DC | PRN
  Administered 2013-11-24: 20 mL

## 2013-11-24 MED ORDER — OXYCODONE HCL 5 MG PO TABS
5.0000 mg | ORAL_TABLET | ORAL | Status: DC | PRN
Start: 2013-11-24 — End: 2013-11-29
  Administered 2013-11-24: 15 mg via ORAL
  Administered 2013-11-25 – 2013-11-29 (×25): 20 mg via ORAL
  Filled 2013-11-24 (×16): qty 4
  Filled 2013-11-24: qty 3
  Filled 2013-11-24 (×9): qty 4

## 2013-11-24 MED ORDER — ACETAMINOPHEN 650 MG RE SUPP: 650.0000 mg | Freq: Four times a day (QID) | RECTAL | Status: DC | PRN

## 2013-11-24 MED ORDER — METOCLOPRAMIDE HCL 10 MG PO TABS: 5.0000 mg | ORAL_TABLET | Freq: Three times a day (TID) | ORAL | Status: DC | PRN

## 2013-11-24 MED ORDER — SODIUM CHLORIDE 0.9 % IJ SOLN
INTRAMUSCULAR | Status: AC
  Filled 2013-11-24: qty 50

## 2013-11-24 MED ORDER — ONDANSETRON HCL 4 MG/2ML IJ SOLN
INTRAMUSCULAR | Status: DC | PRN
  Administered 2013-11-24: 4 mg via INTRAVENOUS

## 2013-11-24 MED ORDER — HYDROMORPHONE HCL PF 1 MG/ML IJ SOLN
0.5000 mg | INTRAMUSCULAR | Status: DC | PRN
  Administered 2013-11-24 – 2013-11-26 (×14): 1 mg via INTRAVENOUS
  Filled 2013-11-24 (×14): qty 1

## 2013-11-24 MED ORDER — DOCUSATE SODIUM 100 MG PO CAPS
100.0000 mg | ORAL_CAPSULE | Freq: Two times a day (BID) | ORAL | Status: DC
  Administered 2013-11-24 – 2013-11-29 (×10): 100 mg via ORAL
  Filled 2013-11-24 (×3): qty 1

## 2013-11-24 MED ORDER — PHENYLEPHRINE HCL 10 MG/ML IJ SOLN
10.0000 mg | INTRAVENOUS | Status: DC | PRN
  Administered 2013-11-24: 50 ug/min via INTRAVENOUS

## 2013-11-24 MED ORDER — MIDAZOLAM HCL 5 MG/5ML IJ SOLN
INTRAMUSCULAR | Status: DC | PRN
  Administered 2013-11-24: 2 mg via INTRAVENOUS

## 2013-11-24 MED ORDER — PANTOPRAZOLE SODIUM 40 MG PO TBEC
40.0000 mg | DELAYED_RELEASE_TABLET | Freq: Every day | ORAL | Status: DC
  Administered 2013-11-25 – 2013-11-29 (×5): 40 mg via ORAL
  Filled 2013-11-24 (×5): qty 1

## 2013-11-24 MED ORDER — LIDOCAINE HCL (CARDIAC) 20 MG/ML IV SOLN
INTRAVENOUS | Status: DC | PRN
  Administered 2013-11-24: 50 mg via INTRAVENOUS

## 2013-11-24 MED ORDER — FLUOXETINE HCL 20 MG PO CAPS
40.0000 mg | ORAL_CAPSULE | ORAL | Status: DC
  Administered 2013-11-25 – 2013-11-29 (×5): 40 mg via ORAL
  Filled 2013-11-24 (×6): qty 2

## 2013-11-24 MED ORDER — DEXAMETHASONE SODIUM PHOSPHATE 10 MG/ML IJ SOLN
10.0000 mg | Freq: Every day | INTRAMUSCULAR | Status: AC
  Administered 2013-11-25: 10 mg via INTRAVENOUS
  Filled 2013-11-24: qty 1

## 2013-11-24 MED ORDER — BUPIVACAINE LIPOSOME 1.3 % IJ SUSP
20.0000 mL | Freq: Once | INTRAMUSCULAR | Status: DC
  Filled 2013-11-24: qty 20

## 2013-11-24 MED ORDER — FLEET ENEMA 7-19 GM/118ML RE ENEM: 1.0000 | ENEMA | Freq: Once | RECTAL | Status: AC | PRN

## 2013-11-24 MED ORDER — PHENOL 1.4 % MT LIQD: 1.0000 | OROMUCOSAL | Status: DC | PRN

## 2013-11-24 MED ORDER — PHENYLEPHRINE HCL 10 MG/ML IJ SOLN
INTRAMUSCULAR | Status: DC | PRN
  Administered 2013-11-24 (×2): 80 ug via INTRAVENOUS

## 2013-11-24 MED ORDER — ONDANSETRON HCL 4 MG PO TABS: 4.0000 mg | ORAL_TABLET | Freq: Four times a day (QID) | ORAL | Status: DC | PRN

## 2013-11-24 MED ORDER — HYDROMORPHONE HCL PF 1 MG/ML IJ SOLN
0.2500 mg | INTRAMUSCULAR | Status: DC | PRN
  Administered 2013-11-24: 0.5 mg via INTRAVENOUS
  Administered 2013-11-24: 0.25 mg via INTRAVENOUS
  Administered 2013-11-24: 0.5 mg via INTRAVENOUS

## 2013-11-24 MED ORDER — SODIUM CHLORIDE 0.9 % IR SOLN
Status: DC | PRN
  Administered 2013-11-24: 2000 mL

## 2013-11-24 MED ORDER — BUPIVACAINE HCL 0.25 % IJ SOLN
INTRAMUSCULAR | Status: DC | PRN
  Administered 2013-11-24: 20 mL

## 2013-11-24 MED ORDER — 0.9 % SODIUM CHLORIDE (POUR BTL) OPTIME
TOPICAL | Status: DC | PRN
  Administered 2013-11-24: 300 mL

## 2013-11-24 MED ORDER — TRANEXAMIC ACID 100 MG/ML IV SOLN
1000.0000 mg | INTRAVENOUS | Status: AC
  Administered 2013-11-24: 1000 mg via INTRAVENOUS
  Filled 2013-11-24: qty 10

## 2013-11-24 MED ORDER — ONDANSETRON HCL 4 MG/2ML IJ SOLN
INTRAMUSCULAR | Status: AC
  Filled 2013-11-24: qty 2

## 2013-11-24 MED ORDER — METHOCARBAMOL 500 MG PO TABS
500.0000 mg | ORAL_TABLET | Freq: Four times a day (QID) | ORAL | Status: DC | PRN
  Administered 2013-11-25 – 2013-11-28 (×7): 500 mg via ORAL
  Filled 2013-11-24 (×7): qty 1

## 2013-11-24 MED ORDER — KETOROLAC TROMETHAMINE 15 MG/ML IJ SOLN
7.5000 mg | Freq: Four times a day (QID) | INTRAMUSCULAR | Status: AC | PRN
  Administered 2013-11-24 – 2013-11-25 (×2): 7.5 mg via INTRAVENOUS
  Filled 2013-11-24 (×2): qty 1

## 2013-11-24 MED ORDER — BUPIVACAINE HCL (PF) 0.25 % IJ SOLN
INTRAMUSCULAR | Status: AC
  Filled 2013-11-24: qty 30

## 2013-11-24 MED ORDER — GABAPENTIN 800 MG PO TABS
800.0000 mg | ORAL_TABLET | Freq: Four times a day (QID) | ORAL | Status: DC
  Filled 2013-11-24 (×4): qty 1

## 2013-11-24 MED ORDER — VANCOMYCIN HCL IN DEXTROSE 1-5 GM/200ML-% IV SOLN
1000.0000 mg | Freq: Two times a day (BID) | INTRAVENOUS | Status: AC
  Administered 2013-11-25: 1000 mg via INTRAVENOUS
  Filled 2013-11-24: qty 200

## 2013-11-24 MED ORDER — DEXAMETHASONE SODIUM PHOSPHATE 10 MG/ML IJ SOLN
INTRAMUSCULAR | Status: AC
  Filled 2013-11-24: qty 1

## 2013-11-24 SURGICAL SUPPLY — 64 items
BAG ZIPLOCK 12X15 (MISCELLANEOUS) ×2 IMPLANT
BIT DRILL 2.8X128 (BIT) IMPLANT
BLADE EXTENDED COATED 6.5IN (ELECTRODE) ×2 IMPLANT
BLADE SAW SAG 73X25 THK (BLADE) ×1
BLADE SAW SGTL 73X25 THK (BLADE) ×1 IMPLANT
BOWL SMART MIX CTS (DISPOSABLE) ×4 IMPLANT
CABLE (Orthopedic Implant) ×6 IMPLANT
CEMENT HV SMART SET (Cement) ×4 IMPLANT
CONT SPECI 4OZ STER CLIK (MISCELLANEOUS) IMPLANT
DRAPE INCISE IOBAN 66X45 STRL (DRAPES) ×2 IMPLANT
DRAPE ORTHO SPLIT 77X108 STRL (DRAPES) ×2
DRAPE POUCH INSTRU U-SHP 10X18 (DRAPES) ×2 IMPLANT
DRAPE SURG ORHT 6 SPLT 77X108 (DRAPES) ×2 IMPLANT
DRAPE U-SHAPE 47X51 STRL (DRAPES) ×2 IMPLANT
DRSG ADAPTIC 3X8 NADH LF (GAUZE/BANDAGES/DRESSINGS) ×2 IMPLANT
DRSG EMULSION OIL 3X16 NADH (GAUZE/BANDAGES/DRESSINGS) ×2 IMPLANT
DRSG MEPILEX BORDER 4X12 (GAUZE/BANDAGES/DRESSINGS) ×2 IMPLANT
DRSG MEPILEX BORDER 4X4 (GAUZE/BANDAGES/DRESSINGS) ×2 IMPLANT
DRSG MEPILEX BORDER 4X8 (GAUZE/BANDAGES/DRESSINGS) ×2 IMPLANT
DURAPREP 26ML APPLICATOR (WOUND CARE) ×2 IMPLANT
ELECT REM PT RETURN 9FT ADLT (ELECTROSURGICAL) ×2
ELECTRODE REM PT RTRN 9FT ADLT (ELECTROSURGICAL) ×1 IMPLANT
EVACUATOR 1/8 PVC DRAIN (DRAIN) ×2 IMPLANT
FACESHIELD LNG OPTICON STERILE (SAFETY) ×8 IMPLANT
GAUZE SPONGE 4X4 16PLY XRAY LF (GAUZE/BANDAGES/DRESSINGS) ×2 IMPLANT
GLOVE BIO SURGEON STRL SZ7.5 (GLOVE) ×2 IMPLANT
GLOVE BIO SURGEON STRL SZ8 (GLOVE) ×2 IMPLANT
GLOVE BIOGEL PI IND STRL 8 (GLOVE) ×3 IMPLANT
GLOVE BIOGEL PI INDICATOR 8 (GLOVE) ×3
GLOVE SURG SS PI 6.5 STRL IVOR (GLOVE) ×4 IMPLANT
GOWN PREVENTION PLUS LG XLONG (DISPOSABLE) ×4 IMPLANT
GOWN PREVENTION PLUS XXLARGE (GOWN DISPOSABLE) ×2 IMPLANT
GOWN STRL REIN XL XLG (GOWN DISPOSABLE) ×2 IMPLANT
HANDPIECE INTERPULSE COAX TIP (DISPOSABLE) ×1
HEAD FEM STD 32X+9 STRL (Hips) ×2 IMPLANT
IMMOBILIZER KNEE 20 (SOFTGOODS) ×2
IMMOBILIZER KNEE 20 THIGH 36 (SOFTGOODS) ×1 IMPLANT
KIT BASIN OR (CUSTOM PROCEDURE TRAY) ×2 IMPLANT
LINER ACET CUP 42MMX32MM (Hips) ×2 IMPLANT
MANIFOLD NEPTUNE II (INSTRUMENTS) ×2 IMPLANT
NDL SAFETY ECLIPSE 18X1.5 (NEEDLE) ×2 IMPLANT
NEEDLE HYPO 18GX1.5 SHARP (NEEDLE) ×2
NS IRRIG 1000ML POUR BTL (IV SOLUTION) ×2 IMPLANT
PACK TOTAL JOINT (CUSTOM PROCEDURE TRAY) ×2 IMPLANT
PASSER SUT SWANSON 36MM LOOP (INSTRUMENTS) ×2 IMPLANT
POSITIONER SURGICAL ARM (MISCELLANEOUS) ×2 IMPLANT
SET HNDPC FAN SPRY TIP SCT (DISPOSABLE) ×1 IMPLANT
SPONGE GAUZE 4X4 12PLY (GAUZE/BANDAGES/DRESSINGS) IMPLANT
SPONGE LAP 18X18 X RAY DECT (DISPOSABLE) ×8 IMPLANT
STAPLER VISISTAT 35W (STAPLE) ×4 IMPLANT
STEM CEMENTED SUMMIT SZ2 (Stem) ×2 IMPLANT
SUCTION FRAZIER TIP 10 FR DISP (SUCTIONS) ×2 IMPLANT
SUT ETHIBOND NAB CT1 #1 30IN (SUTURE) ×4 IMPLANT
SUT VIC AB 1 CT1 27 (SUTURE) ×3
SUT VIC AB 1 CT1 27XBRD ANTBC (SUTURE) ×3 IMPLANT
SUT VIC AB 2-0 CT1 27 (SUTURE) ×4
SUT VIC AB 2-0 CT1 TAPERPNT 27 (SUTURE) ×4 IMPLANT
SUT VLOC 180 0 24IN GS25 (SUTURE) ×6 IMPLANT
SWAB COLLECTION DEVICE MRSA (MISCELLANEOUS) ×2 IMPLANT
SYR 50ML LL SCALE MARK (SYRINGE) IMPLANT
TOWEL OR 17X26 10 PK STRL BLUE (TOWEL DISPOSABLE) ×4 IMPLANT
TRAY FOLEY CATH 14FRSI W/METER (CATHETERS) ×2 IMPLANT
TUBE ANAEROBIC SPECIMEN COL (MISCELLANEOUS) ×2 IMPLANT
WATER STERILE IRR 1500ML POUR (IV SOLUTION) ×2 IMPLANT

## 2013-11-24 NOTE — Transfer of Care (Signed)
Immediate Anesthesia Transfer of Care Note  Patient: [name]  Procedure(s) Performed: Procedure(s) (LRB): RESECTION ARTHROPLASTY WITH ANTIBIOTIC SPACER (Left)  Patient Location: PACU  Anesthesia Type: General  Level of Consciousness: sedated, patient cooperative and responds to stimulation  Airway & Oxygen Therapy: Patient Spontanous Breathing and Patient connected to face mask oxgen  Post-op Assessment: Report given to PACU RN and Post -op Vital signs reviewed and stable  Post vital signs: Reviewed and stable  Complications: No apparent anesthesia complications

## 2013-11-24 NOTE — Anesthesia Postprocedure Evaluation (Signed)
  Anesthesia Post-op Note  Patient: [name]  Procedure(s) Performed: Procedure(s) (LRB): RESECTION ARTHROPLASTY WITH ANTIBIOTIC SPACER (Left)  Patient Location: PACU  Anesthesia Type: General  Level of Consciousness: awake and alert   Airway and Oxygen Therapy: Patient Spontanous Breathing  Post-op Pain: mild  Post-op Assessment: Post-op Vital signs reviewed, Patient's Cardiovascular Status Stable, Respiratory Function Stable, Patent Airway and No signs of Nausea or vomiting  Last Vitals:  Filed Vitals:   11/24/13 1830  BP:   Pulse:   Temp: 37.1 C  Resp: 12    Post-op Vital Signs: stable   Complications: No apparent anesthesia complications

## 2013-11-24 NOTE — Plan of Care (Signed)
Problem: Consults Goal: Diagnosis- Total Joint Replacement Left hip revision

## 2013-11-24 NOTE — H&P (View-Only) (Signed)
[name]  DOB: 01/19/1951 Married / Language: English / Race: White Female  Date of Admission:  11/24/2013  Chief Complaint:  Left Hip Pain  History of Present Illness The patient is a 62 year old female who comes in for a preoperative History and Physical. The patient is scheduled for a left acetabular revision versus total hip revision to be performed by Dr. Frank V. Aluisio, MD at [facility] on 12/01/2013. The patient is a 62 year old female who presents for follow up of their hip. The patient is being followed for their bilateral total hip revisions (left 07/18/2006 & right 12/04/2011). Symptoms reported today include: pain and swelling (on lateral side and groin of the left hip, that is know starting to radiate down the leg, states it is getting progressively worse over time). Note for "Follow-up Hip": Overall the right hip is doing well [name] states she started to develop a discomfort and swelling back in July. She has a complex history in regards to both hips. The left hip has been replaced and revised. She did have a constrained liner due to instability. She feels as though the right side is doing fine. The left side started getting swollen and painful in July. She said this was the first available appointment after she had called. She denies any fevers, chills or infectious type symptoms. She did not get any grinding in the joint. 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 It is felt that she would require acetabular revision versus full revision of the left hip. They have been treated conservatively in the past for the above stated problem and despite conservative measures, they continue to have progressive pain and severe functional limitations and dysfunction. They have failed non-operative management including home exercise, medications, and injections. It is felt that they would benefit from undergoing total joint  replacement. Risks and benefits of the procedure have been discussed with the patient and they elect to proceed with surgery. There are no active contraindications to surgery such as ongoing infection or rapidly progressive neurological disease.   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 Nausea, Vomiting. Morphine Derivatives. Itching.    Family History Father. Deceased, Chronic Renal Failure Syndrome, Colon Cancer, Heart disease. Age 84, Was on dialysis. Bladder Cancer. Mother. Deceased, Postmenopausal Osteoporosis. Age 81, peforated colon. Osteoporosis. mother Osteoarthritis. Mother. mother    Social History Exercise. Exercises never Children. 1 Marital status. married Post-Surgical Plans. Plan to go Home. Tobacco use. Current every day smoker. current every day smoker; smoke(d) 1 pack(s) per day Living situation. live with spouse Number of flights of stairs before winded. 2-3 Current work status. working part time [employer]. Living Will and Healthcare POA Alcohol use. Occasional alcohol use. current drinker; drinks wine; only occasionally per week    Medication History ALPRAZolam (0.5MG Tablet, Oral) Active. KlonoPIN (0.5MG Tablet, Oral) Active. PROzac (20MG Capsule, Oral) Active. Neurontin ( Oral) Specific dose unknown - Active. Omeprazole (20MG Capsule DR, Oral) Active. Percocet ( Oral) Specific dose unknown - Active.   Past Surgical History Arthroscopy of Knee. right Breast Mass; Local Excision. left Carpal Tunnel Repair. bilateral Total Hip Replacement. bilateral Spinal Fusion. lower back Spinal Surgery Tonsillectomy   Medical History Degenerative Disc Disease Hiatal Hernia Osteoporosis S/P irrigation and debridement right hip Pneumonia. Past History Anxiety Disorder History of ARDS (acute respiratory distress syndrome) following prev. back surgery (11  hours) Chronic Hepatitis Menopause Measles Mumps   Osteoarthritis History of MRSA infection (V12.04)   Review of Systems General:Present- Night Sweats. Not Present- Chills, Fever, Appetite Loss, Fatigue, Feeling sick, Weight Gain and Weight Loss. Skin:Not Present- Itching, Rash, Skin Color Changes, Ulcer, Psoriasis and Change in Hair or Nails. HEENT:Present- Ringing in the Ears. Not Present- Sensitivity to light, Hearing problems and Nose Bleed. Neck:Not Present- Swollen Glands and Neck Mass. Respiratory:Not Present- Snoring, Chronic Cough, Bloody sputum and Dyspnea. Cardiovascular:Not Present- Shortness of Breath, Chest Pain, Swelling of Extremities, Leg Cramps and Palpitations. Gastrointestinal:Not Present- Bloody Stool, Heartburn, Abdominal Pain, Vomiting, Nausea and Incontinence of Stool. Female Genitourinary:Not Present- Blood in Urine, Menstrual Irregularities, Frequency, Incontinence and Nocturia. Musculoskeletal:Present- Joint Pain, Back Pain and Spasms. Not Present- Muscle Weakness, Muscle Pain, Joint Stiffness and Joint Swelling. Neurological:Not Present- Tingling, Numbness, Burning, Tremor, Headaches and Dizziness. Psychiatric:Not Present- Anxiety, Depression and Memory Loss. Endocrine:Not Present- Cold Intolerance, Heat Intolerance, Excessive hunger and Excessive Thirst. Hematology:Not Present- Abnormal Bleeding, Anemia, Blood Clots and Easy Bruising.    Vitals Weight: 106 lb Height: 60 in Body Surface Area: 1.43 m Body Mass Index: 20.7 kg/m Pulse: 88 (Regular) Resp.: 16 (Unlabored) BP: 108/64 (Sitting, Left Arm, Standard)     Physical Exam The physical exam findings are as follows:  Note: Patient is a 62 year old white, petite framed female with continued right hip problems.   General Mental Status - Alert, cooperative and good historian. General Appearance- pleasant. Not in acute distress. Orientation- Oriented X3. Build &  Nutrition- Lean, Petite, Well nourished and Well developed.   Head and Neck Head- normocephalic, atraumatic . Neck Global Assessment- supple. no bruit auscultated on the right and no bruit auscultated on the left.   Eye Pupil- Bilateral- Regular and Round. Motion- Bilateral- EOMI.   Chest and Lung Exam Auscultation: Breath sounds:- clear at anterior chest wall and - clear at posterior chest wall. Adventitious sounds:- No Adventitious sounds.   Cardiovascular Auscultation:Rhythm- Regular rate and rhythm. Heart Sounds- S1 WNL and S2 WNL. Murmurs & Other Heart Sounds: Murmur 1:Location- Aortic Area and Sternal Border - Left. Timing- Holosystolic. Grade- III/VI.   Abdomen Palpation/Percussion:Palpation and Percussion of the abdomen reveal - Non Tender and Soft. Note: Flat Rigidity (guarding)- Abdomen is soft. Auscultation:Auscultation of the abdomen reveals - Bowel sounds normal.   Female Genitourinary Not done, not pertinent to present illness  Musculoskeletal She is in no distress. There is some swelling laterally about the left proximal thigh. I can flex her hip to 100, rotate in 20, out 30 and abduct 30 with some discomfort on hip flexion. The right hip has a pain free range of motion.  RADIOGRAPHS: AP pelvis , AP and lateral of both hips show the constrained liner on the left. The hip is in good position. I do not see any definitive periprosthetic abnormalities.  The right hip shows the long stem prosthesis from the previous fracture. Everything is in good position with no abnormalities.   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 Aluisio.  Plan is to go home.  PCP - Dr. Lon Morgan - Patient has been seen preoperatively and felt to be stable for surgery.  The patient does not have any contraindications and will receive TXA (tranexamic  acid) prior to surgery.  Signed electronically by Amantha Sklar L Felesha Moncrieffe, III PA-C  

## 2013-11-24 NOTE — Brief Op Note (Signed)
11/24/2013  5:59 PM  PATIENT:  [name]  62 y.o. female  PRE-OPERATIVE DIAGNOSIS:   FAILED LEFT TOTAL HIP ARTHROPLASTY  POST-OPERATIVE DIAGNOSIS:   INFECTED LEFT TOTAL HIP ARTHROPLASTY  PROCEDURE:  Procedure(s) with comments: RESECTION ARTHROPLASTY WITH ANTIBIOTIC SPACER (Left) - WOUND CLASSICICATION- DIRTY  SURGEON:  Surgeon(s) and Role:    * Loanne Drilling, MD - Primary  PHYSICIAN ASSISTANT:   ASSISTANTS: Avel Peace, PA-C   ANESTHESIA:   general  EBL:  Total I/O In: 2300 [I.V.:2300] Out: 1200 [Urine:500; Blood:700]  BLOOD ADMINISTERED:none  DRAINS: (Medium) Hemovact drain(s) in the left hip with  Suction Open   LOCAL MEDICATIONS USED:  OTHER Exparel  COUNTS:  YES  TOURNIQUET:  * No tourniquets in log *  DICTATION: .Other Dictation: Dictation Number [id number]  PLAN OF CARE: Admit to inpatient   PATIENT DISPOSITION:  PACU - hemodynamically stable.

## 2013-11-24 NOTE — Anesthesia Preprocedure Evaluation (Addendum)
Anesthesia Evaluation  Patient identified by MRN, date of birth, ID band Patient awake  General Assessment Comment:Degenerative Disc Disease Hiatal Hernia Osteoporosis S/P irrigation and debridement right hip Pneumonia.  Past History Anxiety Disorder History of ARDS (acute respiratory distress syndrome) following prev. back surgery (11 hours) Chronic Hepatitis Menopause Measles Mumps Osteoarthritis History of MRSA infection (V12.04)   Reviewed: Allergy & Precautions, H&P , NPO status , Patient's Chart, lab work & pertinent test results  Airway Mallampati: II TM Distance: >3 FB Neck ROM: Full    Dental no notable dental hx.    Pulmonary Current Smoker,  breath sounds clear to auscultation  Pulmonary exam normal       Cardiovascular Exercise Tolerance: Good negative cardio ROS  Rhythm:Regular Rate:Normal     Neuro/Psych PSYCHIATRIC DISORDERS Anxiety Depression  Neuromuscular disease    GI/Hepatic hiatal hernia, GERD-  Medicated,(+) Hepatitis -  Endo/Other  negative endocrine ROS  Renal/GU negative Renal ROS  negative genitourinary   Musculoskeletal negative musculoskeletal ROS (+)   Abdominal   Peds negative pediatric ROS (+)  Hematology  (+) anemia ,   Anesthesia Other Findings   Reproductive/Obstetrics negative OB ROS                          Anesthesia Physical Anesthesia Plan  ASA: II  Anesthesia Plan: General   Post-op Pain Management:    Induction: Intravenous  Airway Management Planned: Oral ETT  Additional Equipment:   Intra-op Plan:   Post-operative Plan: Extubation in OR  Informed Consent: I have reviewed the patients History and Physical, chart, labs and discussed the procedure including the risks, benefits and alternatives for the proposed anesthesia with the patient or authorized representative who has indicated his/her understanding and acceptance.    Dental advisory given  Plan Discussed with: CRNA  Anesthesia Plan Comments:         Anesthesia Quick Evaluation

## 2013-11-24 NOTE — Interval H&P Note (Signed)
History and Physical Interval Note:  11/24/2013 12:53 PM  [name]  has presented today for surgery, with the diagnosis of  FAILED LEFT TOTAL HIP ARTHROPLASTY  The various methods of treatment have been discussed with the patient and family. After consideration of risks, benefits and other options for treatment, the patient has consented to  Procedure(s): LEFT TOTAL ACETABULAR VERSUS HIP REVISION (Left) as a surgical intervention .  The patient's history has been reviewed, patient examined, no change in status, stable for surgery.  I have reviewed the patient's chart and labs.  Questions were answered to the patient's satisfaction.     Loanne Drilling

## 2013-11-25 ENCOUNTER — Other Ambulatory Visit (HOSPITAL_COMMUNITY): Payer: 59

## 2013-11-25 ENCOUNTER — Encounter (HOSPITAL_COMMUNITY): Payer: Self-pay | Admitting: Orthopedic Surgery

## 2013-11-25 DIAGNOSIS — T8450XA Infection and inflammatory reaction due to unspecified internal joint prosthesis, initial encounter: Principal | ICD-10-CM

## 2013-11-25 DIAGNOSIS — T8452XA Infection and inflammatory reaction due to internal left hip prosthesis, initial encounter: Secondary | ICD-10-CM

## 2013-11-25 DIAGNOSIS — Z8614 Personal history of Methicillin resistant Staphylococcus aureus infection: Secondary | ICD-10-CM

## 2013-11-25 LAB — BASIC METABOLIC PANEL
BUN: 10 mg/dL (ref 6–23)
CO2: 24 mEq/L (ref 19–32)
Chloride: 101 mEq/L (ref 96–112)
GFR calc Af Amer: >90 mL/min (ref >90)
Glucose, Bld: 121 mg/dL — ABNORMAL HIGH (ref 70–99)
Potassium: 3.7 mEq/L (ref 3.5–5.1)
Sodium: 132 mEq/L — ABNORMAL LOW (ref 135–145)

## 2013-11-25 LAB — CBC
HCT: 28.6 % — ABNORMAL LOW (ref 36.0–46.0)
Hemoglobin: 8.8 g/dL — ABNORMAL LOW (ref 12.0–15.0)
MCV: 86.1 fL (ref 78.0–100.0)
RBC: 3.32 MIL/uL — ABNORMAL LOW (ref 3.87–5.11)
RDW: 14.5 % (ref 11.5–15.5)
WBC: 11.2 10*3/uL — ABNORMAL HIGH (ref 4.0–10.5)

## 2013-11-25 MED ORDER — GABAPENTIN 400 MG PO CAPS
800.0000 mg | ORAL_CAPSULE | Freq: Four times a day (QID) | ORAL | Status: DC
  Administered 2013-11-25 – 2013-11-29 (×16): 800 mg via ORAL
  Filled 2013-11-25 (×21): qty 2

## 2013-11-25 MED ORDER — SODIUM CHLORIDE 0.9 % IJ SOLN
10.0000 mL | INTRAMUSCULAR | Status: DC | PRN
  Administered 2013-11-26 – 2013-11-29 (×5): 10 mL

## 2013-11-25 MED ORDER — VANCOMYCIN HCL IN DEXTROSE 750-5 MG/150ML-% IV SOLN
750.0000 mg | Freq: Two times a day (BID) | INTRAVENOUS | Status: DC
  Administered 2013-11-25 – 2013-11-29 (×8): 750 mg via INTRAVENOUS
  Filled 2013-11-25 (×8): qty 150

## 2013-11-25 NOTE — Evaluation (Signed)
Physical Therapy Evaluation Patient Details Name: [name] MRN: [id number] DOB: 1951-01-24 Today's Date: 11/25/2013 Time: 1478-2956 PT Time Calculation (min): 13 min  PT Assessment / Plan / Recommendation History of Present Illness     Clinical Impression  Patient is s/p Left hip resection arthroplasty with antibiotic spacer placement surgery resulting in functional limitations due to the deficits listed below (see PT Problem List). Patient will benefit from skilled PT to increase their independence and safety with mobility to allow discharge to the venue listed below.  Pt plans to d/c home.  Reviewed posterior hip precautions and educated on TDWB status.     PT Assessment  Patient needs continued PT services    Follow Up Recommendations  No PT follow up (per surgeon, likely will need after spacer removal)    Does the patient have the potential to tolerate intense rehabilitation      Barriers to Discharge        Equipment Recommendations  None recommended by PT    Recommendations for Other Services     Frequency Min 5X/week    Precautions / Restrictions Precautions Precautions: Fall;Posterior Hip Required Braces or Orthoses: Knee Immobilizer - Left Restrictions Weight Bearing Restrictions: Yes LLE Weight Bearing: Touchdown weight bearing   Pertinent Vitals/Pain Premedicated, repositioned to comfort in recliner      Mobility  Bed Mobility Bed Mobility: Supine to Sit Supine to Sit: 4: Min assist;HOB elevated;With rails Details for Bed Mobility Assistance: assist for L LE, slow due to hip pain Transfers Transfers: Sit to Stand;Stand to Sit;Stand Pivot Transfers Sit to Stand: 4: Min assist;From bed;With upper extremity assist Stand to Sit: 4: Min assist;With upper extremity assist;To chair/3-in-1 Stand Pivot Transfers: 4: Min assist Details for Transfer Assistance: verbal cues for hand placement and TDWB, pt did well maintaining TDWB, cues for L LE forward   Ambulation/Gait Ambulation/Gait Assistance: Not tested (comment)    Exercises     PT Diagnosis: Difficulty walking;Acute pain  PT Problem List: Decreased strength;Decreased mobility;Pain;Decreased knowledge of precautions;Decreased knowledge of use of DME PT Treatment Interventions: DME instruction;Gait training;Patient/family education;Functional mobility training;Therapeutic activities;Therapeutic exercise;Stair training     PT Goals(Current goals can be found in the care plan section) Acute Rehab PT Goals PT Goal Formulation: With patient Time For Goal Achievement: 12/02/13 Potential to Achieve Goals: Good  Visit Information  Last PT Received On: 11/25/13 Assistance Needed: +1       Prior Functioning  Home Living Family/patient expects to be discharged to:: Private residence Living Arrangements: Spouse/significant other Type of Home: House Home Access: Stairs to enter Secretary/administrator of Steps: 3 Entrance Stairs-Rails: Right Home Layout: One level Home Equipment: Environmental consultant - 2 wheels;Bedside commode;Crutches;Cane - single point;Wheelchair - manual Prior Function Level of Independence: Independent with assistive device(s) Communication Communication: No difficulties    Cognition  Cognition Arousal/Alertness: Awake/alert Behavior During Therapy: WFL for tasks assessed/performed Overall Cognitive Status: Within Functional Limits for tasks assessed    Extremity/Trunk Assessment Lower Extremity Assessment Lower Extremity Assessment: LLE deficits/detail LLE Deficits / Details: maintained KI and adhered to precautions, assist for bed mobility LLE: Unable to fully assess due to immobilization   Balance    End of Session PT - End of Session Equipment Utilized During Treatment: Left knee immobilizer Activity Tolerance: Patient tolerated treatment well Patient left: in chair;with call bell/phone within reach;with nursing/sitter in room Nurse Communication: Weight  bearing status (nsg tech aware of TDWB)  GP     Phung Kotas,KATHrine E 11/25/2013, 10:45 AM Thomasene Mohair  Cyndie Chime, PT, DPT 11/25/2013 Pager: [phone]

## 2013-11-25 NOTE — OR Nursing (Signed)
Update on 11/25/13. Implant opened and not used per Velta Addison RN. Cerclage cable zimmer.

## 2013-11-25 NOTE — Op Note (Signed)
[name], [name]                 ACCOUNT NO.:  [id number]  MEDICAL RECORD NO.:  [id number]  LOCATION:  1606                         FACILITY:  [facility]  PHYSICIAN:  Ollen Gross, M.D.    DATE OF BIRTH:  [birth year]  DATE OF PROCEDURE: DATE OF DISCHARGE:                              OPERATIVE REPORT   PREOPERATIVE DIAGNOSIS:  Failed left total hip arthroplasty.  POSTOPERATIVE DIAGNOSIS:  Infected left total hip arthroplasty.  PROCEDURE:  Left hip resection arthroplasty with antibiotic spacer placement.  SURGEON:  Ollen Gross, MD  ASSISTANT:  Alexzandrew L. Perkins, PA-C  ANESTHESIA:  General.  ESTIMATED BLOOD LOSS:  700 mL.  DRAINS:  Hemovac x1.  COMPLICATIONS:  None.  CONDITION:  Stable to recovery.  BRIEF CLINICAL NOTE:  [name] is a 63 year old female, long complex history in regard to her left hip.  She recently presented with pain and swelling in the hip.  Aspiration in the office did not show infection. MRI showed a large fluid collection.  It was felt that she may have had a metallosis from impingement on a constrained liner.  She presents now for acetabular revision versus total hip revision.  DESCRIPTION OF PROCEDURE:  After successful administration of general anesthetic, the patient was placed in the right lateral decubitus position with the left side up and held with the hip positioner.  Her left lower extremity is isolated from her perineum with plastic drapes and prepped and draped in the usual sterile fashion.  Posterolateral incision was made with a 10 blade through subcutaneous tissue to the fascia lata.  The fascia lata was incised and has tremendous amount of seropurulent fluid that came out of the joint.  This was sent for stat Gram stain, which showed large white cells and rare Gram-positive bacteria.  It was felt that this represented infection and we elected to do a resection arthroplasty antibiotic spacer.  There is a large amount of  metal-stained debris and fibrotic debris around the femur and around the joint.  This was thoroughly resected down to normal-appearing tissue.  I then opened the joint and noted there is a lot of osteolytic debris which was all removed.  There was some purulence in the joint too.  The sciatic nerve had been palpated and protected throughout this entire dissection.  The locking ring was then removed from the liner and then the hip was dislocated.  The femoral head was removed.  The femur was then retracted anteriorly to gain acetabular exposure.  We were able to remove the acetabular liner from the acetabular shell.  There were 3 screws in the shell and it was removed.  I then disrupted the interface between the acetabular shell and bone using Moreland revision osteotomes.  I was able to remove the acetabular shell without any bone loss.  I then reamed with a 51 mm reamer to get down to fresh bone.  We then addressed the femur.  I utilized flexible osteotomes to disrupt the interface between the femoral component and bone proximally.  It is approximately porous-coated component.  I felt as though I disrupted the interface well throughout the porous coating and attempted to remove the stem.  The stem was extremely well fixed.  I decided that the safest way to do this was going to be with an extended trochanteric osteotomy.  We then made an incision in the fascia of the vastus lateralis and elevated the muscle off the intermuscular septum exposing the lateral cortex of the femur.  I outlined the osteotomy with drill holes and then utilized an oscillating saw to connect the drill holes.  We were able to osteotomize this fragment in 1 large fragment.  I then disrupted the interface between the osteotomy and then the metal with osteotomes.  The femoral component was still intact and we noted that the bone had grown into the flutes of the component.  We were able to remove that bone and then  removed the component without any further bone loss.  We then thoroughly irrigated the femur and reattached the osteotomy back to the femur and then stabilized it with 3 Zimmer cables.  The cables were tightened and the osteotomy was very stable.  We then thoroughly irrigated the entire wound bed with 3 L of saline using pulsatile lavage.  All other devitalized tissue was debrided back to normal-appearing tissue.  I then mixed 1 batch of cement with 2 g of vancomycin and cemented in the Prostalac acetabular liner into the acetabulum.  I placed in the anatomic position.  While I was awaiting for the cement to harden, we mixed another batch cement and coated the prosthesis which was a size 2, DPU Exel cemented stem.  We coated the entire staff stem with cement and once that hardened we mixed the cement, again with vancomycin and cemented that into her femur.  We cemented in about 25 degrees anteversion.  I then, placed a 32+ 9 head and reduced the femur into the acetabular liner.  This is a snap fit which is potentially equivalent to a locking liner.  I placed it through a range of motion with full extension, external rotation, 70 degrees of flexion, 40 degrees of adduction about 40 degrees of internal rotation, 90 degrees of flexion, 40 degrees internal rotation.  The hip was stable.  Her leg lengths were equal by placing the left leg on top of the right.  We then further irrigated and closed the fascia of the vastus lateralis with running #1 Vicryl suture.  Fascia lata was closed over Hemovac drain with running #1 V-Loc suture.  We then injected 20 mL of Exparel mixed with 30 mL of saline and then an additional 20 mL of 0.25% Marcaine into the fascia lata and subcu tissues.  Subcu was closed with interrupted 2-0 Vicryl and skin closed with staples.  The drains hooked to suction.  Incision cleaned and dried and a bulky sterile dressing applied.  She was placed into a knee immobilizer,  awakened, and transported to recovery in stable condition.  Please note that a surgical assistance and medical necessity for this procedure. Assistance necessary for retraction of vital neurovascular structures, and proper positioning of the limb to allow for safe extraction of the implants as well as allowing for safe of fixation of the trochanteric osteotomy and placement of the new spacer.     Ollen Gross, M.D.     FA/MEDQ  D:  11/24/2013  T:  11/25/2013  Job:  [id number]

## 2013-11-25 NOTE — Progress Notes (Signed)
ANTIBIOTIC CONSULT NOTE - INITIAL  Pharmacy Consult for vancomycin Indication: Prosthetic joint infection  Allergies  Allergen Reactions  . Augmentin [Amoxicillin-Pot Clavulanate] Nausea And Vomiting  . Morphine And Related     In large amounts has caused itching    Patient Measurements: Height: 5' (152.4 cm) Weight: 105 lb (47.628 kg) IBW/kg (Calculated) : 45.5 Adjusted Body Weight:   Vital Signs: Temp: 97.3 F (36.3 C) (12/04 0608) Temp src: Axillary (12/04 0608) BP: 103/62 mmHg (12/04 4540) Pulse Rate: 78 (12/04 0608) Intake/Output from previous day: 12/03 0701 - 12/04 0700 In: 4345.8 [P.O.:900; I.V.:3245.8; IV Piggyback:200] Out: 1800 [Urine:950; Drains:150; Blood:700] Intake/Output from this shift: Total I/O In: 60 [P.O.:60] Out: 500 [Urine:500]  Labs:  Recent Labs  11/25/13 0504  WBC 11.2*  HGB 8.8*  PLT 353  CREATININE 0.43*   Estimated Creatinine Clearance: 51 ml/min (by C-G formula based on Cr of 0.43). No results found for this basename: VANCOTROUGH, Leodis Binet, VANCORANDOM, GENTTROUGH, GENTPEAK, GENTRANDOM, TOBRATROUGH, TOBRAPEAK, TOBRARND, AMIKACINPEAK, AMIKACINTROU, AMIKACIN,  in the last 72 hours   Microbiology: Recent Results (from the past 720 hour(s))  SURGICAL PCR SCREEN     Status: None   Collection Time    11/16/13 12:55 PM      Result Value Range Status   MRSA, PCR NEGATIVE  NEGATIVE Final   Staphylococcus aureus NEGATIVE  NEGATIVE Final   Comment:            The Xpert SA Assay (FDA     approved for NASAL specimens     in patients over 64 years of age),     is one component of     a comprehensive surveillance     program.  Test performance has     been validated by [employer] for patients greater     than or equal to 34 year old.     It is not intended     to diagnose infection nor to     guide or monitor treatment.  GRAM STAIN     Status: None   Collection Time    11/24/13  1:53 PM      Result Value Range Status   Specimen Description HIP LEFT FLUID   Final   Special Requests ANCEF   Final   Gram Stain     Final   Value: MODERATE WBC PRESENT, PREDOMINANTLY PMN     RARE GRAM POSITIVE COCCI IN PAIRS     Gram Stain Report Called to,Read Back By and Verified With: POZIL,T. AT  1441 ON 12.03.14 BY LOVE,T.   Report Status 11/24/2013 FINAL   Final  ANAEROBIC CULTURE     Status: None   Collection Time    11/24/13  1:53 PM      Result Value Range Status   Specimen Description HIP LEFT FLUID   Final   Special Requests ANCEF   Final   Gram Stain     Final   Value: MODERATE WBC PRESENT,BOTH PMN AND MONONUCLEAR     NO ORGANISMS SEEN     Performed at [facility]   Culture     Final   Value: NO ANAEROBES ISOLATED; CULTURE IN PROGRESS FOR 5 DAYS     Performed at [facility]   Report Status PENDING   Incomplete  BODY FLUID CULTURE     Status: None   Collection Time    11/24/13  1:53 PM      Result Value  Range Status   Specimen Description HIP LEFT FLUID   Final   Special Requests ANCEF   Final   Gram Stain     Final   Value: MODERATE WBC PRESENT, PREDOMINANTLY PMN     RARE GRAM POSITIVE COCCI IN PAIRS     Performed by [facility] Gram Stain Report Called to,Read Back By and Verified With: Gram Stain Report Called to,Read Back By and Verified With: POZIL T. [id number] ON 11/24/2013 BY LOVE T.     Performed at [facility]     Final   Value: NO GROWTH 1 DAY     Performed at [facility]   Report Status PENDING   Incomplete    Medical History: Past Medical History  Diagnosis Date  . Osteoporosis   . Hiatal hernia   . Depression   . Dislocation of right hip   . Dislocation     pt has had multiple dislocations of both hips   . Arthritis     DDD--HX OF MULTIPLE SPINAL FUSIONS- HAS A LOT OF BACK PAIN; OA -  . GERD (gastroesophageal reflux disease)   . Anxiety   . Electrical shock sensation 1973    DAMAGED BLOOD VESSEL LEFT HAND - VASCULAR SURGERY WAS  DONE  . 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 Balance problems     PT RELATES TO ROD IN RT LEG  . Ringing in ears   . Hepatitis     dx in 1991  AUTO IMMUNE - NO PROBLEMS NOW   Assessment: 62 YOM with complex history of problems with L hip. MRI revealed fluid collection, concern for metallosis. Taken to OR 12/3 and drainage was seropurulent consistent with infection thus antibiotic spacer implanted.. Given ancef pre-operatively then vancomycin 1gm x 1 post-operatively (12/4 0400). Per ortho notes, ID consulted. Orders to start vancomycin therapy.  12/4 >>vancomycin  >>  Tmax: WBCs: 11.2 Renal: SCr = 0.43 for est CrCl = 69ml/min (Normalized using adj Scr = 0.8mg /dl), C-G = 04VW/UJW (low d/t short stature and low total BW)  12/3: L hip fluid - NGTD (Gram stain = GPC pairs), no anaerobes  Goal of Therapy:  Vancomycin trough level 15-20 mcg/ml  Plan:   Vancomycin 750mg  IV q12h (given 1gm dose at 04:00 this am)  Check steady state vancomycin trough  Monitor renal function  Plan 6 weeks of antibiotic  Juliette Alcide, PharmD, BCPS.   Pager: [phone] 11/25/2013,1:11 PM

## 2013-11-25 NOTE — Consult Note (Signed)
[facility]    Date of Admission:  11/24/2013           Day 1 vancomycin       Reason for Consult: Infection of left prosthetic    Referring Physician: Dr. Trudee Grip Primary Care Physician: Dr. Berenice Bouton   Principal Problem:   Prosthetic joint infection of left hip Active Problems:   Failed total hip arthroplasty   . acetaminophen  1,000 mg Oral Q6H  . ALPRAZolam  2 mg Oral QHS  . docusate sodium  100 mg Oral BID  . FLUoxetine  40 mg Oral BH-q7a  . gabapentin  800 mg Oral QID  . pantoprazole  40 mg Oral Daily  . rivaroxaban  10 mg Oral Q breakfast  . vancomycin  750 mg Intravenous Q12H    Recommendations: 1. Continue vancomycin pending final operative culture results 2. PICC placement    Assessment: Operative findings and Gram stain suggest that she has developed some infection of her left prosthetic hip. I agree with empiric vancomycin pending culture results. She will need at least 6 weeks of postoperative therapy.   I have discussed these recommendations with [name] and her husband. I will check culture results and followup tomorrow.    HPI: [name] is a 62 y.o. female  with a history of severe degenerative arthritis who has had multiple hip surgeries. She has had bilateral total hip arthroplasties and does have required revisions. She underwent a left total hip replacement for the first time in 2005 but the implants failed and she required revision in 2007. There was no evidence of infection at that time. Subsequently she had a right total hip replacement. It became infected with MRSA in 2010. She was treated with vancomycin and the hip was retained until she required revision on the right side in 2012.   In July she began to notice some bulging and discomfort over her left lateral hip. She tripped over one of her dogs at home and landed on her left hip and then had progressive discomfort. Apparently an aspirate obtained  in the office did not reveal any obvious infection. The hip was reaspirated again recently and showed bloody drainage. She was admitted and underwent resection arthroplasty with spacer placement yesterday. There was evidence of some metallosis, bone lysis and purulence. Operative Gram stain showed gram-positive cocci in pairs and her culture is pending.    Review of Systems: Constitutional: positive for sweats, negative for anorexia, chills, fevers and weight loss Eyes: negative Ears, nose, mouth, throat, and face: negative Respiratory: negative Cardiovascular: negative Gastrointestinal: negative Genitourinary:negative  Past Medical History  Diagnosis Date  . Osteoporosis   . Hiatal hernia   . Depression   . Dislocation of right hip   . Dislocation     pt has had multiple dislocations of both hips   . Arthritis     DDD--HX OF MULTIPLE SPINAL FUSIONS- HAS A LOT OF BACK PAIN; OA -  . GERD (gastroesophageal reflux disease)   . Anxiety   . Electrical shock sensation 1973    DAMAGED BLOOD VESSEL LEFT HAND - VASCULAR SURGERY WAS DONE  . 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 Balance problems     PT RELATES TO ROD IN RT LEG  . Ringing in ears   . Hepatitis     dx in 35  AUTO IMMUNE -  NO PROBLEMS NOW    History  Substance Use Topics  . Smoking status: Current Some Day Smoker -- 1.00 packs/day for 40 years    Types: Cigarettes  . Smokeless tobacco: Never Used  . Alcohol Use: Yes     Comment: occassional    History reviewed. No pertinent family history. Allergies  Allergen Reactions  . Augmentin [Amoxicillin-Pot Clavulanate] Nausea And Vomiting  . Morphine And Related     In large amounts has caused itching    OBJECTIVE: Blood pressure 104/66, pulse 80, temperature 98.2 F (36.8 C), temperature source Oral, resp. rate 16, height 5' (1.524 m), weight 47.628 kg (105 lb), SpO2 92.00%. General:  she is comfortable in no distress  Skin:  no rash  Lungs:   clear  Cor: Regular S1-S2 with no murmurs  Abdomen: Soft nontender  Dressing over left hip  Lab Results  Component Value Date   WBC 11.2* 11/25/2013   HGB 8.8* 11/25/2013   HCT 28.6* 11/25/2013   MCV 86.1 11/25/2013   PLT 353 11/25/2013   BMET    Component Value Date/Time   NA 132* 11/25/2013 0504   K 3.7 11/25/2013 0504   CL 101 11/25/2013 0504   CO2 24 11/25/2013 0504   GLUCOSE 121* 11/25/2013 0504   BUN 10 11/25/2013 0504   CREATININE 0.43* 11/25/2013 0504   CALCIUM 7.5* 11/25/2013 0504   GFRNONAA >90 11/25/2013 0504   GFRAA >90 11/25/2013 0504   Lab Results  Component Value Date   ALT 25 12/02/2011   AST 28 12/02/2011   ALKPHOS 127* 12/02/2011   BILITOT 0.2* 12/02/2011   No results found for this basename: CRP   No results found for this basename: ESRSEDRATE, SEDRATE, POCTSEDRATE   Microbiology: Recent Results (from the past 240 hour(s))  SURGICAL PCR SCREEN     Status: None   Collection Time    11/16/13 12:55 PM      Result Value Range Status   MRSA, PCR NEGATIVE  NEGATIVE Final   Staphylococcus aureus NEGATIVE  NEGATIVE Final   Comment:            The Xpert SA Assay (FDA     approved for NASAL specimens     in patients over 36 years of age),     is one component of     a comprehensive surveillance     program.  Test performance has     been validated by [employer] for patients greater     than or equal to 18 year old.     It is not intended     to diagnose infection nor to     guide or monitor treatment.  GRAM STAIN     Status: None   Collection Time    11/24/13  1:53 PM      Result Value Range Status   Specimen Description HIP LEFT FLUID   Final   Special Requests ANCEF   Final   Gram Stain     Final   Value: MODERATE WBC PRESENT, PREDOMINANTLY PMN     RARE GRAM POSITIVE COCCI IN PAIRS     Gram Stain Report Called to,Read Back By and Verified With: POZIL,T. AT  1441 ON 12.03.14 BY LOVE,T.   Report Status 11/24/2013 FINAL   Final  ANAEROBIC CULTURE      Status: None   Collection Time    11/24/13  1:53 PM      Result Value Range  Status   Specimen Description HIP LEFT FLUID   Final   Special Requests ANCEF   Final   Gram Stain     Final   Value: MODERATE WBC PRESENT,BOTH PMN AND MONONUCLEAR     NO ORGANISMS SEEN     Performed at [facility]   Culture     Final   Value: NO ANAEROBES ISOLATED; CULTURE IN PROGRESS FOR 5 DAYS     Performed at [facility]   Report Status PENDING   Incomplete  BODY FLUID CULTURE     Status: None   Collection Time    11/24/13  1:53 PM      Result Value Range Status   Specimen Description HIP LEFT FLUID   Final   Special Requests ANCEF   Final   Gram Stain     Final   Value: MODERATE WBC PRESENT, PREDOMINANTLY PMN     RARE GRAM POSITIVE COCCI IN PAIRS     Performed by [facility] Gram Stain Report Called to,Read Back By and Verified With: Gram Stain Report Called to,Read Back By and Verified With: POZIL T. [id number] ON 11/24/2013 BY LOVE T.     Performed at [facility]     Final   Value: NO GROWTH 1 DAY     Performed at [facility]   Report Status PENDING   Incomplete    Cliffton Asters, MD [facility] [phone] pager   [phone] cell 11/25/2013, 3:53 PM

## 2013-11-25 NOTE — Progress Notes (Signed)
   Subjective: 1 Day Post-Op Procedure(s) (LRB): RESECTION ARTHROPLASTY WITH ANTIBIOTIC SPACER (Left) Patient reports pain as moderate and severe.   Patient seen in rounds for Dr. Lequita Halt. Briefly discussed surgical findings on rounds again with patient and husband, Patient is having problems with pain in the hip and thigh., requiring pain medications We will start therapy today.  Plan is to go Home after hospital stay.  Objective: Vital signs in last 24 hours: Temp:  [97.3 F (36.3 C)-98.7 F (37.1 C)] 97.3 F (36.3 C) (12/04 0608) Pulse Rate:  [68-96] 78 (12/04 0608) Resp:  [11-18] 16 (12/04 1610) BP: (99-127)/(62-84) 103/62 mmHg (12/04 0608) SpO2:  [91 %-99 %] 91 % (12/04 0608) Weight:  [47.628 kg (105 lb)] 47.628 kg (105 lb) (12/03 1935)  Intake/Output from previous day:  Intake/Output Summary (Last 24 hours) at 11/25/13 1018 Last data filed at 11/25/13 0820  Gross per 24 hour  Intake 4405.83 ml  Output   1800 ml  Net 2605.83 ml    Intake/Output this shift: Total I/O In: 60 [P.O.:60] Out: -   Labs:  Recent Labs  11/25/13 0504  HGB 8.8*    Recent Labs  11/25/13 0504  WBC 11.2*  RBC 3.32*  HCT 28.6*  PLT 353    Recent Labs  11/25/13 0504  NA 132*  K 3.7  CL 101  CO2 24  BUN 10  CREATININE 0.43*  GLUCOSE 121*  CALCIUM 7.5*   No results found for this basename: LABPT, INR,  in the last 72 hours  EXAM General - Patient is Alert and Appropriate Extremity - Neurovascular intact Sensation intact distally Dressing - dressing C/D/I Motor Function - intact, moving foot and toes well on exam.  Hemovac drain left in place  Past Medical History  Diagnosis Date  . Osteoporosis   . Hiatal hernia   . Depression   . Dislocation of right hip   . Dislocation     pt has had multiple dislocations of both hips   . Arthritis     DDD--HX OF MULTIPLE SPINAL FUSIONS- HAS A LOT OF BACK PAIN; OA -  . GERD (gastroesophageal reflux disease)   . Anxiety   .  Electrical shock sensation 1973    DAMAGED BLOOD VESSEL LEFT HAND - VASCULAR SURGERY WAS DONE  . 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 Balance problems     PT RELATES TO ROD IN RT LEG  . Ringing in ears   . Hepatitis     dx in 1991  AUTO IMMUNE - NO PROBLEMS NOW    Assessment/Plan: 1 Day Post-Op Procedure(s) (LRB): RESECTION ARTHROPLASTY WITH ANTIBIOTIC SPACER (Left) Principal Problem:   Failed total hip arthroplasty  Estimated body mass index is 20.51 kg/(m^2) as calculated from the following:   Height as of this encounter: 5' (1.524 m).   Weight as of this encounter: 47.628 kg (105 lb). Up with therapy TDWB only DVT Prophylaxis - Xarelto  Hemovac Pulled Begin Therapy Hip Preacutions  PICC LINE today I.D. consult  PERKINS, ALEXZANDREW 11/25/2013, 10:18 AM

## 2013-11-25 NOTE — Progress Notes (Signed)
Utilization review completed.  

## 2013-11-25 NOTE — Evaluation (Signed)
Occupational Therapy Evaluation Patient Details Name: [name] MRN: [id number] DOB: [birth year] Today's Date: 11/25/2013 Time: 7829-5621 OT Time Calculation (min): 15 min  OT Assessment / Plan / Recommendation History of present illness     Clinical Impression   Pt was admitted for spacer for failed THA of L hip.  She has TDWB and THPs.  She is appropriate for skilled OT to review education re: precautions for adls and bathroom mobility with min guard level goals in acute.    OT Assessment  Patient needs continued OT Services    Follow Up Recommendations  No OT follow up (likely)    Barriers to Discharge      Equipment Recommendations  None recommended by OT    Recommendations for Other Services    Frequency  Min 2X/week    Precautions / Restrictions Precautions Precautions: Fall;Posterior Hip Required Braces or Orthoses: Knee Immobilizer - Left Restrictions Weight Bearing Restrictions: Yes LLE Weight Bearing: Touchdown weight bearing   Pertinent Vitals/Pain 10/10 L hip:  Repositioned:  NT informed RN that pain meds were needed    ADL  Toilet Transfer: Simulated;Minimal assistance Toilet Transfer Method: Stand pivot Toilet Transfer Equipment:  (chair to bed) Transfers/Ambulation Related to ADLs: assisted with transfer back to bed (simulating toilet):  min A.  A x 2 for bed mobility as pt was extending back and therapist was keeping LLE from internal rotation.   ADL Comments: Limited eval due to pain.  Pt has not used her AE in awhile.  Will review on next visit.  At time of eval, pain was limiting adls (total A due to pain).  Saw pt as she wanted to get back to bed Cued for THPS with bed mobility and positioning in bed   OT Diagnosis: Generalized weakness  OT Problem List: Decreased strength;Decreased activity tolerance;Decreased knowledge of use of DME or AE;Pain;Decreased knowledge of precautions OT Treatment Interventions: Self-care/ADL training;DME and/or AE  instruction;Patient/family education   OT Goals(Current goals can be found in the care plan section) Acute Rehab OT Goals Patient Stated Goal: back to bed; not be left up in chair OT Goal Formulation: With patient Time For Goal Achievement: 12/02/13 Potential to Achieve Goals: Good ADL Goals Pt Will Transfer to Toilet: with min guard assist;ambulating;bedside commode (following thps and tdwb) Pt Will Perform Toileting - Clothing Manipulation and hygiene: with supervision;sit to/from stand Additional ADL Goal #1: pt will complete lb adls with min guard, sit to stand with ae  Visit Information  Last OT Received On: 11/25/13 Assistance Needed: +2 (for back to bed due to pain)       Prior Functioning     Home Living Family/patient expects to be discharged to:: Private residence Living Arrangements: Spouse/significant other Type of Home: House Home Access: Stairs to enter Secretary/administrator of Steps: 3 Entrance Stairs-Rails: Right Home Layout: One level Home Equipment: Environmental consultant - 2 wheels;Bedside commode;Crutches;Cane - single point;Wheelchair - manual Prior Function Level of Independence: Independent with assistive device(s) Communication Communication: No difficulties         Vision/Perception     Cognition  Cognition Arousal/Alertness: Awake/alert Behavior During Therapy: WFL for tasks assessed/performed Overall Cognitive Status: Within Functional Limits for tasks assessed    Extremity/Trunk Assessment Upper Extremity Assessment Upper Extremity Assessment:  (not tested:  wfls for using walker and maintaining tdwb)     Mobility Bed Mobility Bed Mobility: Supine to Sit Sit to Supine: 1: +2 Total assist Sit to Supine: Patient Percentage: 70% Details for Bed  Mobility Assistance: assist to guide trunk and for LLE Transfers Sit to Stand: 4: Min assist;With armrests;From chair/3-in-1 Stand to Sit: 4: Min assist;With upper extremity assist;To chair/3-in-1 Details  for Transfer Assistance: cues for hand placement; able to maintain TDWB     Exercise     Balance     End of Session OT - End of Session Activity Tolerance: Patient limited by pain Patient left: in bed;with call bell/phone within reach;with restraints reapplied Nurse Communication: Patient requests pain meds  GO     Terrion Gencarelli 11/25/2013, 1:58 PM

## 2013-11-26 DIAGNOSIS — E871 Hypo-osmolality and hyponatremia: Secondary | ICD-10-CM

## 2013-11-26 LAB — CBC
HCT: 27.1 % — ABNORMAL LOW (ref 36.0–46.0)
Hemoglobin: 8.4 g/dL — ABNORMAL LOW (ref 12.0–15.0)
MCH: 27.1 pg (ref 26.0–34.0)
MCHC: 31 g/dL (ref 30.0–36.0)
RBC: 3.1 MIL/uL — ABNORMAL LOW (ref 3.87–5.11)
RDW: 14.7 % (ref 11.5–15.5)

## 2013-11-26 LAB — BASIC METABOLIC PANEL
BUN: 8 mg/dL (ref 6–23)
CO2: 26 mEq/L (ref 19–32)
Calcium: 8 mg/dL — ABNORMAL LOW (ref 8.4–10.5)
GFR calc non Af Amer: >90 mL/min (ref >90)
Glucose, Bld: 126 mg/dL — ABNORMAL HIGH (ref 70–99)
Potassium: 3.4 mEq/L — ABNORMAL LOW (ref 3.5–5.1)

## 2013-11-26 MED ORDER — POTASSIUM CHLORIDE CRYS ER 20 MEQ PO TBCR: 40.0000 meq | EXTENDED_RELEASE_TABLET | Freq: Every day | ORAL | Status: DC

## 2013-11-26 MED ORDER — NYSTATIN 100000 UNIT/ML MT SUSP
5.0000 mL | Freq: Four times a day (QID) | OROMUCOSAL | Status: DC
  Administered 2013-11-26 – 2013-11-29 (×9): 500000 [IU] via ORAL
  Filled 2013-11-26 (×15): qty 5

## 2013-11-26 MED ORDER — POTASSIUM CHLORIDE CRYS ER 20 MEQ PO TBCR
40.0000 meq | EXTENDED_RELEASE_TABLET | Freq: Once | ORAL | Status: AC
  Administered 2013-11-26: 40 meq via ORAL
  Filled 2013-11-26: qty 2

## 2013-11-26 MED ORDER — POTASSIUM CHLORIDE CRYS ER 20 MEQ PO TBCR: 40.0000 meq | EXTENDED_RELEASE_TABLET | Freq: Once | ORAL | Status: DC

## 2013-11-26 NOTE — Care Management Note (Signed)
  Page 1 of 1   11/26/2013     2:12:21 PM   CARE MANAGEMENT NOTE 11/26/2013  Patient:  [name], [name]   Account Number:  [id number]  Date Initiated:  11/25/2013  Documentation initiated by:  Colleen Can  Subjective/Objective Assessment:   DX LT HIP RESECTION ARTHROPLASTY     Action/Plan:   CM spoke with patient. Plans are for patient to return to her home in [place] where spouse will be caregiver. She will need hh services for home IV abx. Already has DME   Anticipated DC Date:  11/27/2013   Anticipated DC Plan:  HOME W HOME HEALTH SERVICES      DC Planning Services  CM consult      [facility] Choice  HOME HEALTH   Choice offered to / List presented to:  C-1 Patient        HH arranged  HH-1 RN  IV Antibiotics      HH agency  [facility]   Status of service:  In process, will continue to follow Medicare Important Message given?   (If response is "NO", the following Medicare IM given date fields will be blank) Date Medicare IM given:   Date Additional Medicare IM given:    Discharge Disposition:    Per UR Regulation:  Reviewed for med. necessity/level of care/duration of stay  If discussed at Long Length of Stay Meetings, dates discussed:    Comments:  11/26/2013 Colleen Can BSN RN CCM [phone] [facility] rep states they can provide [facility] services for home IV abx; awaiting HH orders.

## 2013-11-26 NOTE — Progress Notes (Signed)
Occupational Therapy Treatment Patient Details Name: [name] MRN: [id number] DOB: 19-Dec-1951 Today's Date: 11/26/2013 Time: 4098-1191 OT Time Calculation (min): 17 min  OT Assessment / Plan / Recommendation  History of present illness  Pt was admitted for spacer for failed THA of L hip. She has TDWB and THPs.    OT comments  Pt doing much better today:  Goals upgraded.    Follow Up Recommendations  No OT follow up    Barriers to Discharge       Equipment Recommendations  None recommended by OT    Recommendations for Other Services    Frequency Min 2X/week   Progress towards OT Goals Progress towards OT goals: Goals met and updated - see care plan  Plan      Precautions / Restrictions Precautions Precautions: Fall;Posterior Hip Precaution Comments: pt states MD took KI off and hip abductor brace ordered.  Pt agreeable to donning after friend assists with bathing:  friend has been assisting and did not need help Restrictions LLE Weight Bearing: Touchdown weight bearing   Pertinent Vitals/Pain 5/10 LLE with movement.  repositioned    ADL  Lower Body Dressing: Supervision/safety (reacher and sock aid to don/doff socks) Where Assessed - Lower Body Dressing: Supported sit to stand Toilet Transfer: Hydrographic surveyor Method: Sit to Barista: Raised toilet seat with arms (or 3-in-1 over toilet) Toileting - Clothing Manipulation and Hygiene: Supervision/safety Where Assessed - Toileting Clothing Manipulation and Hygiene: Sit to stand from 3-in-1 or toilet Equipment Used: Reacher;Rolling walker;Sock aid Transfers/Ambulation Related to ADLs: ambulated to bathroom with min guard ADL Comments: cued pt for hip precautions with bed mobility and sitting eob; pt verbalizes.  Hip abductor brace ordered:  see precaution comments above    OT Diagnosis:    OT Problem List:   OT Treatment Interventions:     OT Goals(current goals can now be found in  the care plan section) ADL Goals Additional ADL Goal #1: cg will be independent donning/doffing hip abductor brace (or adjusting) Additional ADL Goal #2: pt will not need any cues for thps during adls/bathroom mobility  Visit Information  Last OT Received On: 11/26/13 Assistance Needed: +1    Subjective Data      Prior Functioning       Cognition  Cognition Arousal/Alertness: Awake/alert Behavior During Therapy: WFL for tasks assessed/performed Overall Cognitive Status: Within Functional Limits for tasks assessed    Mobility  Bed Mobility Supine to Sit: 4: Min assist;HOB elevated;With rails Details for Bed Mobility Assistance: assist for LLE Transfers Sit to Stand: 5: Supervision Details for Transfer Assistance: no cues needed; pt maintained TDWB    Exercises      Balance     End of Session OT - End of Session Activity Tolerance: Patient tolerated treatment well Patient left: in bed;with call bell/phone within reach;with family/visitor present  GO     [name] 11/26/2013, 10:36 AM Marica Otter, OTR/L [phone] 11/26/2013

## 2013-11-26 NOTE — Progress Notes (Signed)
Subjective: 2 Days Post-Op Procedure(s) (LRB): RESECTION ARTHROPLASTY WITH ANTIBIOTIC SPACER (Left) Patient reports pain as mild and moderate.   Patient seen in rounds with Dr. Lequita Halt.  Husband in room at bedside.  Briefly discussed the surgery with Dr. Lequita Halt and the tentative plan of going home on Monday.  She will need IV antibiotics.  Greatly appreciate I.D. Consult and guidance.  Plan on 6 weeks or more of IV Vanc depending upon final cultures. Patient is having problems with pain in the hip and thigh, requiring pain medications Plan is to go Home after hospital stay.  Objective: Vital signs in last 24 hours: Temp:  [97.8 F (36.6 C)-98.4 F (36.9 C)] 97.8 F (36.6 C) (12/05 0623) Pulse Rate:  [71-93] 93 (12/05 0623) Resp:  [16] 16 (12/05 0623) BP: (101-104)/(61-66) 104/64 mmHg (12/05 0623) SpO2:  [92 %-96 %] 96 % (12/05 0623)  Intake/Output from previous day:  Intake/Output Summary (Last 24 hours) at 11/26/13 0804 Last data filed at 11/26/13 0600  Gross per 24 hour  Intake 2283.75 ml  Output   2600 ml  Net -316.25 ml    Intake/Output this shift:    Labs:  Recent Labs  11/25/13 0504 11/26/13 0557  HGB 8.8* 8.4*    Recent Labs  11/25/13 0504 11/26/13 0557  WBC 11.2* 12.5*  RBC 3.32* 3.10*  HCT 28.6* 27.1*  PLT 353 418*    Recent Labs  11/25/13 0504 11/26/13 0557  NA 132* 136  K 3.7 3.4*  CL 101 104  CO2 24 26  BUN 10 8  CREATININE 0.43* 0.40*  GLUCOSE 121* 126*  CALCIUM 7.5* 8.0*   No results found for this basename: LABPT, INR,  in the last 72 hours  EXAM General - Patient is Alert and Appropriate Extremity - Neurovascular intact Sensation intact distally Dressing/Incision - clean, dry, no drainage, healing Motor Function - intact, moving foot and toes well on exam.   Past Medical History  Diagnosis Date  . Osteoporosis   . Hiatal hernia   . Depression   . Dislocation of right hip   . Dislocation     pt has had multiple  dislocations of both hips   . Arthritis     DDD--HX OF MULTIPLE SPINAL FUSIONS- HAS A LOT OF BACK PAIN; OA -  . GERD (gastroesophageal reflux disease)   . Anxiety   . Electrical shock sensation 1973    DAMAGED BLOOD VESSEL LEFT HAND - VASCULAR SURGERY WAS DONE  . 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 Balance problems     PT RELATES TO ROD IN RT LEG  . Ringing in ears   . Hepatitis     dx in 1991  AUTO IMMUNE - NO PROBLEMS NOW    Assessment/Plan: 2 Days Post-Op Procedure(s) (LRB): RESECTION ARTHROPLASTY WITH ANTIBIOTIC SPACER (Left) Principal Problem:   Prosthetic joint infection of left hip Active Problems:   Failed total hip arthroplasty  Estimated body mass index is 20.51 kg/(m^2) as calculated from the following:   Height as of this encounter: 5' (1.524 m).   Weight as of this encounter: 47.628 kg (105 lb). Up with therapy Continue ABX therapy due to infection  I.D. Recommendations:  1. Continue vancomycin pending final operative culture results 2. PICC placement  Assessment:  Operative findings and Gram stain suggest that she has developed some infection of her left prosthetic hip. I agree with empiric vancomycin pending culture results. She will  need at least 6 weeks of postoperative therapy. I have discussed these recommendations with Mrs. Mansour and her husband. I will check culture results and followup tomorrow.   DVT Prophylaxis - Xarelto PICC line IV ABX  TDWB Only  Dr. Aluisio's partners will be coming by over the weekend checking the patient and will be available for any questions. Recheck patient on Monday and tentatively pan on release first of the week.  PERKINS, ALEXZANDREW 11/26/2013, 8:04 AM

## 2013-11-26 NOTE — Progress Notes (Signed)
Patient ID: [name], female   DOB: [birth year], 62 y.o.   MRN: [id number]         [facility] for Infectious Disease    Date of Admission:  11/24/2013           Day 2 vancomycin  Principal Problem:   Prosthetic joint infection of left hip Active Problems:   Postop Acute blood loss anemia   Postop Hypokalemia   Failed total hip arthroplasty   Postop Hyponatremia   . ALPRAZolam  2 mg Oral QHS  . docusate sodium  100 mg Oral BID  . FLUoxetine  40 mg Oral BH-[id number]  . gabapentin  800 mg Oral QID  . pantoprazole  40 mg Oral Daily  . rivaroxaban  10 mg Oral Q breakfast  . vancomycin  750 mg Intravenous Q12H    Subjective: She has the expected discomfort in her left hip but has been much more active today.  Objective: Temp:  [97.8 F (36.6 C)-98.9 F (37.2 C)] 98.9 F (37.2 C) (12/05 1445) Pulse Rate:  [71-93] 89 (12/05 1445) Resp:  [15-16] 15 (12/05 1445) BP: (98-104)/(61-64) 98/64 mmHg (12/05 1445) SpO2:  [93 %-96 %] 93 % (12/05 1445)  General: She is up walking in her room with her walker Skin: PICC in place Left hip dressing clean and dry  Lab Results Lab Results  Component Value Date   WBC 12.5* 11/26/2013   HGB 8.4* 11/26/2013   HCT 27.1* 11/26/2013   MCV 87.4 11/26/2013   PLT 418* 11/26/2013    Lab Results  Component Value Date   CREATININE 0.40* 11/26/2013   BUN 8 11/26/2013   NA 136 11/26/2013   K 3.4* 11/26/2013   CL 104 11/26/2013   CO2 26 11/26/2013    Lab Results  Component Value Date   ALT 25 12/02/2011   AST 28 12/02/2011   ALKPHOS 127* 12/02/2011   BILITOT 0.2* 12/02/2011      Microbiology: Recent Results (from the past 240 hour(s))  GRAM STAIN     Status: None   Collection Time    11/24/13  1:53 PM      Result Value Range Status   Specimen Description HIP LEFT FLUID   Final   Special Requests ANCEF   Final   Gram Stain     Final   Value: MODERATE WBC PRESENT, PREDOMINANTLY PMN     RARE GRAM POSITIVE COCCI IN PAIRS     Gram Stain  Report Called to,Read Back By and Verified With: POZIL,T. AT  1441 ON 12.03.14 BY LOVE,T.   Report Status 11/24/2013 FINAL   Final  ANAEROBIC CULTURE     Status: None   Collection Time    11/24/13  1:53 PM      Result Value Range Status   Specimen Description HIP LEFT FLUID   Final   Special Requests ANCEF   Final   Gram Stain     Final   Value: MODERATE WBC PRESENT,BOTH PMN AND MONONUCLEAR     NO ORGANISMS SEEN     Performed at [facility]   Culture     Final   Value: NO ANAEROBES ISOLATED; CULTURE IN PROGRESS FOR 5 DAYS     Performed at [facility]   Report Status PENDING   Incomplete  BODY FLUID CULTURE     Status: None   Collection Time    11/24/13  1:53 PM      Result Value Range Status  Specimen Description HIP LEFT FLUID   Final   Special Requests ANCEF   Final   Gram Stain     Final   Value: MODERATE WBC PRESENT, PREDOMINANTLY PMN     RARE GRAM POSITIVE COCCI IN PAIRS     Performed by [facility] Gram Stain Report Called to,Read Back By and Verified With: Gram Stain Report Called to,Read Back By and Verified With: POZIL T. [id number] ON 11/24/2013 BY LOVE T.     Performed at [facility]     Final   Value: NO GROWTH 2 DAYS     Performed at [facility]   Report Status PENDING   Incomplete    Assessment: Operative cultures are negative so for about with a positive Gram stain and operative findings I would assume that she had prosthetic hip infection and continue empiric antibiotic therapy. She will need 6 weeks of postoperative therapy. I will continue vancomycin for now and have my partner, Dr. Charise Killian, check cultures over the weekend. He will make any changes in antibiotic therapy that might be indicated if cultures turn positive. I will followup on Monday, December 8.   Plan: 1. Continue vancomycin Please call Dr. Daiva Eves, [phone], for any infectious disease questions this weekend  Cliffton Asters, MD [facility]  for Infectious Disease [facility] [phone] pager   [phone] cell 11/26/2013, 3:03 PM

## 2013-11-26 NOTE — Progress Notes (Signed)
PT Cancellation Note  Patient Details Name: [name] MRN: [id number] DOB: [birth year]   Cancelled Treatment:    Reason Eval/Treat Not Completed: Other (comment) Pt up today with OT and just went to bathroom with nsg.  Order for hip abductor brace and not yet in room.  Will check back tomorrow.   Antonius Hartlage,KATHrine E 11/26/2013, 2:39 PM Zenovia Jarred, PT, DPT 11/26/2013 Pager: [phone]

## 2013-11-27 LAB — BASIC METABOLIC PANEL
CO2: 25 mEq/L (ref 19–32)
Calcium: 7.9 mg/dL — ABNORMAL LOW (ref 8.4–10.5)
Chloride: 100 mEq/L (ref 96–112)
Creatinine, Ser: 0.45 mg/dL — ABNORMAL LOW (ref 0.50–1.10)
Glucose, Bld: 89 mg/dL (ref 70–99)
Sodium: 132 mEq/L — ABNORMAL LOW (ref 135–145)

## 2013-11-27 LAB — CBC
Hemoglobin: 7.9 g/dL — ABNORMAL LOW (ref 12.0–15.0)
MCH: 26.6 pg (ref 26.0–34.0)
MCV: 85.9 fL (ref 78.0–100.0)
RBC: 2.97 MIL/uL — ABNORMAL LOW (ref 3.87–5.11)

## 2013-11-27 LAB — BODY FLUID CULTURE

## 2013-11-27 NOTE — Progress Notes (Signed)
Physical Therapy Treatment Patient Details Name: [name] MRN: [id number] DOB: 1951/11/11 Today's Date: 11/27/2013 Time: 1006-1030 PT Time Calculation (min): 24 min  PT Assessment / Plan / Recommendation  History of Present Illness     PT Comments   Pt progressing but limited by pain  Follow Up Recommendations  No PT follow up     Does the patient have the potential to tolerate intense rehabilitation     Barriers to Discharge        Equipment Recommendations  None recommended by PT    Recommendations for Other Services    Frequency Min 5X/week   Progress towards PT Goals Progress towards PT goals: Progressing toward goals  Plan Current plan remains appropriate    Precautions / Restrictions Precautions Precautions: Fall;Posterior Hip Precaution Comments: Hip ABD brace Restrictions LLE Weight Bearing: Touchdown weight bearing   Pertinent Vitals/Pain sats 87% on RA, O2 replaced at 3L to bring up to 93-94%, RN notified     Mobility  Bed Mobility Bed Mobility: Supine to Sit Supine to Sit: 3: Mod assist Details for Bed Mobility Assistance: assist for LLE and trunk Transfers Transfers: Sit to Stand;Stand to Sit Sit to Stand: 4: Min assist;From chair/3-in-1;From bed Stand to Sit: 4: Min assist;To bed;To chair/3-in-1 Details for Transfer Assistance: cues for hand placement, safety and THP Ambulation/Gait Ambulation/Gait Assistance: 4: Min assist Ambulation Distance (Feet): 15 Feet (times 2) Assistive device: Rolling walker Ambulation/Gait Assistance Details: cues for sequence, TDWB, use of UEs, incr time  Gait Pattern: Step-to pattern Gait velocity: decr    Exercises Total Joint Exercises Ankle Circles/Pumps: Both;10 reps   PT Diagnosis:    PT Problem List:   PT Treatment Interventions:     PT Goals (current goals can now be found in the care plan section) Acute Rehab PT Goals Time For Goal Achievement: 12/02/13 Potential to Achieve Goals: Good  Visit  Information  Last PT Received On: 11/27/13 Assistance Needed: +1    Subjective Data      Cognition  Cognition Arousal/Alertness: Awake/alert Behavior During Therapy: WFL for tasks assessed/performed    Balance     End of Session PT - End of Session Equipment Utilized During Treatment: Other (comment) (hip Abduction brace) Activity Tolerance: Patient limited by fatigue;Patient limited by pain Nurse Communication: Mobility status;Patient requests pain meds   GP     [facility] 11/27/2013, 10:50 AM

## 2013-11-27 NOTE — Progress Notes (Signed)
Occupational Therapy Treatment Patient Details Name: DEANE MELICK MRN: [id number] DOB: 12-Sep-1951 Today's Date: 11/27/2013 Time: 4098-1191 OT Time Calculation (min): 19 min  OT Assessment / Plan / Recommendation     OT comments  Pt with many complaints regarding brace!  Pt states she cant even sit on the toilet in the brace.  Pt states it is too heavy.  Brace in good position.   Follow Up Recommendations  No OT follow up       Equipment Recommendations  None recommended by OT       Frequency Min 2X/week      Plan Discharge plan remains appropriate           ADL  ADL Comments: Pt with many complaints regarding brace and how it fits and how heavy it is.  Pt with discomfort sitting on the toilet and states she feels the brace will dislocate the hip.    Pt feels brace is too heavy.  OT encouraged pt to speak with MD regarding her concerns       Visit Information  Assistance Needed: +1          Cognition  Cognition Arousal/Alertness: Awake/alert Behavior During Therapy: WFL for tasks assessed/performed Overall Cognitive Status: Within Functional Limits for tasks assessed    Mobility  Bed Mobility Bed Mobility: Supine to Sit Supine to Sit: 3: Mod assist Details for Bed Mobility Assistance: assist for LLE and trunk Transfers Sit to Stand: 4: Min assist;From chair/3-in-1;From bed;From toilet Stand to Sit: 4: Min assist;To bed;To chair/3-in-1;To toilet Details for Transfer Assistance: cues for hand placement, safety and THP          End of Session OT - End of Session Activity Tolerance: Patient tolerated treatment well Patient left: in bed;with call bell/phone within reach;with family/visitor present  GO     Ahmoni Edge, [place] 11/27/2013, 2:26 PM

## 2013-11-27 NOTE — Progress Notes (Signed)
   Subjective: 3 Days Post-Op Procedure(s) (LRB): RESECTION ARTHROPLASTY WITH ANTIBIOTIC SPACER (Left)   Patient reports pain as moderate, controlled with medication. No events throughout the night. Slight confusion this morning, still trying to wake up some.  Objective:   VITALS:   Filed Vitals:   11/26/13 2345  BP:   Pulse: 115  Temp: 100.1 F (37.8 C)  Resp: 16    Neurovascular intact Dorsiflexion/Plantar flexion intact Incision: dressing C/D/I No cellulitis present Compartment soft  LABS  Recent Labs  11/25/13 0504 11/26/13 0557 11/27/13 0430  HGB 8.8* 8.4* 7.9*  HCT 28.6* 27.1* 25.5*  WBC 11.2* 12.5* 14.7*  PLT 353 418* 384     Recent Labs  11/25/13 0504 11/26/13 0557 11/27/13 0430  NA 132* 136 132*  K 3.7 3.4* 3.5  BUN 10 8 7   CREATININE 0.43* 0.40* 0.45*  GLUCOSE 121* 126* 89     Assessment/Plan: 3 Days Post-Op Procedure(s) (LRB): RESECTION ARTHROPLASTY WITH ANTIBIOTIC SPACER (Left)  Up with therapy Discharge home with home health once discharged Tentative plan of going home on Monday.  Appreciate I.D. Guidance Plan on 6 weeks or more of IV Vanc depending upon final cultures.       Anastasio Auerbach Randall Colden   PAC  11/27/2013, 8:41 AM

## 2013-11-27 NOTE — Progress Notes (Signed)
11/27/13 1600  PT Visit Information  Last PT Received On 11/27/13  Assistance Needed +1  PT Time Calculation  PT Start Time 1530  PT Stop Time 1619  PT Time Calculation (min) 49 min  Subjective Data  Subjective I hate this brace  Precautions  Precautions Fall;Posterior Hip  Precaution Comments Hip ABD brace; reviewed THP,  brace and TDWB  Restrictions  LLE Weight Bearing TWB  Cognition  Arousal/Alertness Awake/alert  Behavior During Therapy WFL for tasks assessed/performed  Overall Cognitive Status Within Functional Limits for tasks assessed  Bed Mobility  Bed Mobility Supine to Sit;Sit to Supine  Supine to Sit 3: Mod assist  Sit to Supine 3: Mod assist  Details for Bed Mobility Assistance assist for LLE and trunk  Transfers  Transfers Sit to Stand;Stand to Sit  Sit to Stand 4: Min assist;3: Mod assist  Stand to Sit 4: Min assist  Details for Transfer Assistance cues for hand placement, safety and THP  Ambulation/Gait  Ambulation/Gait Assistance 4: Min assist  Ambulation Distance (Feet) 40 Feet  Assistive device Rolling walker  Ambulation/Gait Assistance Details multi-modal cues for TDW, sequence  Gait Pattern Step-to pattern  Gait velocity decr  General Gait Details fatigues quickly  PT - End of Session  Equipment Utilized During Treatment Other (comment)  Activity Tolerance Patient tolerated treatment well  Patient left in bed;with call bell/phone within reach  PT - Assessment/Plan  PT Plan Current plan remains appropriate  PT Frequency Min 5X/week  Follow Up Recommendations No PT follow up  PT equipment None recommended by PT  PT Goal Progression  Progress towards PT goals Progressing toward goals  Acute Rehab PT Goals  Time For Goal Achievement 12/02/13  Potential to Achieve Goals Good  PT General Charges  $$ ACUTE PT VISIT 1 Procedure  PT Treatments  $Gait Training 8-22 mins  $Therapeutic Activity 8-22 mins  $Self Care/Home Management 8-22

## 2013-11-28 LAB — TYPE AND SCREEN
Antibody Screen: NEGATIVE
Donor AG Type: NEGATIVE
Unit division: 0
Unit division: 0

## 2013-11-28 MED ORDER — SODIUM CHLORIDE 0.9 % IV SOLN
1250.0000 mg | Freq: Two times a day (BID) | INTRAVENOUS | Status: DC
  Administered 2013-11-28: 1250 mg via INTRAVENOUS
  Filled 2013-11-28 (×3): qty 1250

## 2013-11-28 MED ORDER — VANCOMYCIN HCL IN DEXTROSE 1-5 GM/200ML-% IV SOLN
1000.0000 mg | INTRAVENOUS | Status: AC
  Administered 2013-11-28: 1000 mg via INTRAVENOUS
  Filled 2013-11-28: qty 200

## 2013-11-28 NOTE — Progress Notes (Signed)
Subjective: 4 Days Post-Op Procedure([name]) (LRB): RESECTION ARTHROPLASTY WITH ANTIBIOTIC SPACER (Left) Pt doing well this AM, pain well tolerated and controlled, progressing well with physical therapy, able to ambulate to bathroom and hallway.  Pt denies N/V/FC, chest pain, SOB, calf pain and paresthesia bilaterally.  Objective: Vital signs in last 24 hours: Temp:  [98.3 F (36.8 C)-101.2 F (38.4 C)] 98.6 F (37 C) (12/07 0628) Pulse Rate:  [103-117] 115 (12/07 0628) Resp:  [16-18] 18 (12/07 0800) BP: (97-105)/(58-62) 101/62 mmHg (12/07 0628) SpO2:  [90 %-96 %] 96 % (12/07 0800)  Intake/Output from previous day: 12/06 0701 - 12/07 0700 In: 600 [P.O.:600] Out: 4 [Urine:3; Stool:1] Intake/Output this shift:     Recent Labs  11/26/13 0557 11/27/13 0430  HGB 8.4* 7.9*    Recent Labs  11/26/13 0557 11/27/13 0430  WBC 12.5* 14.7*  RBC 3.10* 2.97*  HCT 27.1* 25.5*  PLT 418* 384    Recent Labs  11/26/13 0557 11/27/13 0430  NA 136 132*  K 3.4* 3.5  CL 104 100  CO2 26 25  BUN 8 7  CREATININE 0.40* 0.45*  GLUCOSE 126* 89  CALCIUM 8.0* 7.9*   No results found for this basename: LABPT, INR,  in the last 72 hours  Incision is C/D/I, no erythema/edema/drainage noted, negative Homan'[name] sign compartments soft.  Lower extremities are NVI with good motor control of plantar/dorsiflexion of the great toes and feet bilaterally.  No evidence of infection or compartment syndrome noted  Assessment/Plan: 4 Days Post-Op Procedure([name]) (LRB): RESECTION ARTHROPLASTY WITH ANTIBIOTIC SPACER (Left) Discharge tomorrow F/u with Dr. Lequita [name] in 2 weeks  Suzanne [name] 11/28/2013, 11:50 AM

## 2013-11-28 NOTE — Progress Notes (Signed)
ANTIBIOTIC CONSULT NOTE - INITIAL  Pharmacy Consult for vancomycin Indication: Prosthetic joint infection  Allergies  Allergen Reactions  . Augmentin [Amoxicillin-Pot Clavulanate] Nausea And Vomiting  . Morphine And Related     In large amounts has caused itching    Patient Measurements: Height: 5' (152.4 cm) Weight: 105 lb (47.628 kg) IBW/kg (Calculated) : 45.5 Adjusted Body Weight:   Vital Signs: Temp: 98.6 F (37 C) (12/07 0628) Temp src: Oral (12/07 0628) BP: 101/62 mmHg (12/07 0628) Pulse Rate: 115 (12/07 0628) Intake/Output from previous day: 12/06 0701 - 12/07 0700 In: 600 [P.O.:600] Out: 4 [Urine:3; Stool:1] Intake/Output from this shift:    Labs:  Recent Labs  11/26/13 0557 11/27/13 0430  WBC 12.5* 14.7*  HGB 8.4* 7.9*  PLT 418* 384  CREATININE 0.40* 0.45*   Estimated Creatinine Clearance: 51 ml/min (by C-G formula based on Cr of 0.45).  Recent Labs  11/28/13 0524  VANCOTROUGH 5.0*     Microbiology: Recent Results (from the past 720 hour(s))  SURGICAL PCR SCREEN     Status: None   Collection Time    11/16/13 12:55 PM      Result Value Range Status   MRSA, PCR NEGATIVE  NEGATIVE Final   Staphylococcus aureus NEGATIVE  NEGATIVE Final   Comment:            The Xpert SA Assay (FDA     approved for NASAL specimens     in patients over 64 years of age),     is one component of     a comprehensive surveillance     program.  Test performance has     been validated by [employer] for patients greater     than or equal to 64 year old.     It is not intended     to diagnose infection nor to     guide or monitor treatment.  GRAM STAIN     Status: None   Collection Time    11/24/13  1:53 PM      Result Value Range Status   Specimen Description HIP LEFT FLUID   Final   Special Requests ANCEF   Final   Gram Stain     Final   Value: MODERATE WBC PRESENT, PREDOMINANTLY PMN     RARE GRAM POSITIVE COCCI IN PAIRS     Gram Stain Report Called  to,Read Back By and Verified With: POZIL,T. AT  [id number] ON 12.03.14 BY LOVE,T.   Report Status 11/24/2013 FINAL   Final  ANAEROBIC CULTURE     Status: None   Collection Time    11/24/13  1:53 PM      Result Value Range Status   Specimen Description HIP LEFT FLUID   Final   Special Requests ANCEF   Final   Gram Stain     Final   Value: MODERATE WBC PRESENT,BOTH PMN AND MONONUCLEAR     NO ORGANISMS SEEN     Performed at [facility]   Culture     Final   Value: NO ANAEROBES ISOLATED; CULTURE IN PROGRESS FOR 5 DAYS     Performed at [facility]   Report Status PENDING   Incomplete  BODY FLUID CULTURE     Status: None   Collection Time    11/24/13  1:53 PM      Result Value Range Status   Specimen Description HIP LEFT FLUID   Final   Special Requests  ANCEF   Final   Gram Stain     Final   Value: MODERATE WBC PRESENT, PREDOMINANTLY PMN     RARE GRAM POSITIVE COCCI IN PAIRS     Performed by [facility] Gram Stain Report Called to,Read Back By and Verified With: Gram Stain Report Called to,Read Back By and Verified With: POZIL T. [id number] ON 11/24/2013 BY LOVE T.     Performed at [facility]     Final   Value: NO GROWTH 3 DAYS     Performed at [facility]   Report Status 11/27/2013 FINAL   Final    Medical History: Past Medical History  Diagnosis Date  . Osteoporosis   . Hiatal hernia   . Depression   . Dislocation of right hip   . Dislocation     pt has had multiple dislocations of both hips   . Arthritis     DDD--HX OF MULTIPLE SPINAL FUSIONS- HAS A LOT OF BACK PAIN; OA -  . GERD (gastroesophageal reflux disease)   . Anxiety   . Electrical shock sensation 1973    DAMAGED BLOOD VESSEL LEFT HAND - VASCULAR SURGERY WAS DONE  . 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 Balance problems     PT RELATES TO ROD IN RT LEG  . Ringing in ears   . Hepatitis     dx in 1991  AUTO IMMUNE - NO PROBLEMS NOW    Assessment: 62 YOM with complex history of problems with L hip, now s/p resection arthroplasty with antibiotic spacer on 12/3.  Pt received ancef pre-operatively then vancomycin 1gm x 1 post-operatively (12/4 0400). ID following and plans on at least 6 weeks of abx therapy.  Pharmacy consulted to dose vancomycin therapy.  D#4 Antibiotics 12/4 >>vancomycin  >>  Tmax: 101.2 WBCs: Rising, 14.7 (12/6) Renal: SCr = 0.43 for est CrCl = 64ml/min, normalized using adj Scr = 0.8mg /dl, CG = 21HY/QMV (low d/t short stature and low total BW)  12/3: L hip fluid - NGTD (Gram stain = GPC pairs), no anaerobes  Drug levels/dose changes: 12/7 Vancomycin trough (prior to 6th dose) = 5, subtherapeutic.    Goal of Therapy:  Vancomycin trough level 15-20 mcg/ml  Plan:   Dose given this morning at 7:13 AM after trough drawn.  Add additional vancomycin 1gram load to make 1750mg  total.   Increase maintenance dose to Vancomycin 1250mg  IV q12h.  Will attempt to keep patient on q12 hour dosing schedule for ease of administration upon discharge.    Check steady state vancomycin trough  Monitor renal function  Plan 6 weeks of antibiotic  Haynes Hoehn, PharmD, BCPS 11/28/2013, 8:47 AM  Pager: [phone]

## 2013-11-28 NOTE — Progress Notes (Signed)
Physical Therapy Treatment Patient Details Name: [name] MRN: [id number] DOB: 13-Mar-1951 Today's Date: 11/28/2013 Time: 3086-5784 PT Time Calculation (min): 38 min  PT Assessment / Plan / Recommendation  History of Present Illness     PT Comments   Pt progressing but continues to require frequent cueing for THP and for TDWB  Follow Up Recommendations  No PT follow up     Does the patient have the potential to tolerate intense rehabilitation     Barriers to Discharge        Equipment Recommendations  None recommended by PT    Recommendations for Other Services    Frequency Min 5X/week   Progress towards PT Goals Progress towards PT goals: Progressing toward goals  Plan Current plan remains appropriate    Precautions / Restrictions Precautions Precautions: Fall;Posterior Hip Precaution Comments: Hip ABD brace; reviewed THP,  brace and TDWB Restrictions Weight Bearing Restrictions: Yes LLE Weight Bearing: Touchdown weight bearing   Pertinent Vitals/Pain 6/10; premed, ice packs provided    Mobility  Bed Mobility Bed Mobility: Supine to Sit Supine to Sit: 4: Min assist;HOB flat;With rails Details for Bed Mobility Assistance: min assist for L LE Transfers Transfers: Sit to Stand;Stand to Sit Sit to Stand: 4: Min assist;From bed;From chair/3-in-1 Stand to Sit: 4: Min assist;To bed;To chair/3-in-1 Details for Transfer Assistance: cues for hand placement, safety and THP Ambulation/Gait Ambulation/Gait Assistance: 4: Min assist Ambulation Distance (Feet): 65 Feet (and 18) Assistive device: Rolling walker Ambulation/Gait Assistance Details: cues for posture, position from RW, sequence, TDWB, increased UE WB, TDWB, TDWB, TDWB Gait Pattern: Step-to pattern;Shuffle Gait velocity: decr    Exercises     PT Diagnosis:    PT Problem List:   PT Treatment Interventions:     PT Goals (current goals can now be found in the care plan section) Acute Rehab PT Goals Patient  Stated Goal: d/c home tomorrow PT Goal Formulation: With patient Time For Goal Achievement: 12/02/13 Potential to Achieve Goals: Good  Visit Information  Last PT Received On: 11/28/13 Assistance Needed: +1    Subjective Data  Patient Stated Goal: d/c home tomorrow   Cognition  Cognition Arousal/Alertness: Awake/alert Behavior During Therapy: WFL for tasks assessed/performed Overall Cognitive Status: Within Functional Limits for tasks assessed    Balance     End of Session PT - End of Session Activity Tolerance: Patient tolerated treatment well Patient left: in chair;with call bell/phone within reach Nurse Communication: Mobility status   GP     [name] 11/28/2013, 2:49 PM

## 2013-11-29 LAB — ANAEROBIC CULTURE

## 2013-11-29 LAB — BASIC METABOLIC PANEL
BUN: 9 mg/dL (ref 6–23)
CO2: 26 mEq/L (ref 19–32)
Chloride: 102 mEq/L (ref 96–112)
Glucose, Bld: 103 mg/dL — ABNORMAL HIGH (ref 70–99)
Potassium: 3.3 mEq/L — ABNORMAL LOW (ref 3.5–5.1)
Sodium: 135 mEq/L (ref 135–145)

## 2013-11-29 LAB — CBC
HCT: 23.7 % — ABNORMAL LOW (ref 36.0–46.0)
Hemoglobin: 7.4 g/dL — ABNORMAL LOW (ref 12.0–15.0)
MCH: 26.6 pg (ref 26.0–34.0)
MCV: 85.3 fL (ref 78.0–100.0)
RBC: 2.78 MIL/uL — ABNORMAL LOW (ref 3.87–5.11)

## 2013-11-29 MED ORDER — METHOCARBAMOL 500 MG PO TABS: 500.0000 mg | ORAL_TABLET | Freq: Four times a day (QID) | ORAL | Status: DC | PRN

## 2013-11-29 MED ORDER — SODIUM CHLORIDE 0.9 % IV SOLN: 1250.0000 mg | Freq: Two times a day (BID) | INTRAVENOUS | Status: DC

## 2013-11-29 MED ORDER — HEPARIN SOD (PORK) LOCK FLUSH 100 UNIT/ML IV SOLN
250.0000 [IU] | INTRAVENOUS | Status: AC | PRN
  Administered 2013-11-29: 500 [IU]

## 2013-11-29 MED ORDER — OXYCODONE HCL 5 MG PO TABS: 5.0000 mg | ORAL_TABLET | ORAL | Status: DC | PRN

## 2013-11-29 MED ORDER — RIVAROXABAN 10 MG PO TABS: 10.0000 mg | ORAL_TABLET | Freq: Every day | ORAL | Status: DC
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
## 2013-11-29 NOTE — Progress Notes (Signed)
   Subjective: 5 Days Post-Op Procedure(s) (LRB): RESECTION ARTHROPLASTY WITH ANTIBIOTIC SPACER (Left) Patient reports pain as mild.   Patient seen in rounds by Dr. Lequita Halt. Patient is well, and has had no acute complaints or problems Patient is ready to go home today.  Patient is requesting a hospital bed.  Objective: Vital signs in last 24 hours: Temp:  [98.3 F (36.8 C)-99.2 F (37.3 C)] 98.5 F (36.9 C) (12/08 0610) Pulse Rate:  [90-106] 90 (12/08 0610) Resp:  [15-18] 18 (12/08 0610) BP: (82-103)/(47-63) 89/54 mmHg (12/08 0610) SpO2:  [86 %-96 %] 95 % (12/08 0610)  Intake/Output from previous day:  Intake/Output Summary (Last 24 hours) at 11/29/13 0706 Last data filed at 11/29/13 0600  Gross per 24 hour  Intake    800 ml  Output      0 ml  Net    800 ml    Intake/Output this shift:    Labs:  Recent Labs  11/27/13 0430 11/29/13 0318  HGB 7.9* 7.4*    Recent Labs  11/27/13 0430 11/29/13 0318  WBC 14.7* 14.3*  RBC 2.97* 2.78*  HCT 25.5* 23.7*  PLT 384 468*    Recent Labs  11/27/13 0430 11/29/13 0318  NA 132* 135  K 3.5 3.3*  CL 100 102  CO2 25 26  BUN 7 9  CREATININE 0.45* 0.48*  GLUCOSE 89 103*  CALCIUM 7.9* 8.4   No results found for this basename: LABPT, INR,  in the last 72 hours  EXAM: General - Patient is Alert and Appropriate Extremity - Neurovascular intact Sensation intact distally Dorsiflexion/Plantar flexion intact Incision - clean, dry, no drainage Motor Function - intact, moving foot and toes well on exam.   Assessment/Plan: 5 Days Post-Op Procedure(s) (LRB): RESECTION ARTHROPLASTY WITH ANTIBIOTIC SPACER (Left) Procedure(s) (LRB): RESECTION ARTHROPLASTY WITH ANTIBIOTIC SPACER (Left) Past Medical History  Diagnosis Date  . Osteoporosis   . Hiatal hernia   . Depression   . Dislocation of right hip   . Dislocation     pt has had multiple dislocations of both hips   . Arthritis     DDD--HX OF MULTIPLE SPINAL FUSIONS-  HAS A LOT OF BACK PAIN; OA -  . GERD (gastroesophageal reflux disease)   . Anxiety   . Electrical shock sensation 1973    DAMAGED BLOOD VESSEL LEFT HAND - VASCULAR SURGERY WAS DONE  . 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 Balance problems     PT RELATES TO ROD IN RT LEG  . Ringing in ears   . Hepatitis     dx in 1991  AUTO IMMUNE - NO PROBLEMS NOW   Principal Problem:   Prosthetic joint infection of left hip Active Problems:   Postop Acute blood loss anemia   Postop Hypokalemia   Failed total hip arthroplasty   Postop Hyponatremia  Estimated body mass index is 20.51 kg/(m^2) as calculated from the following:   Height as of this encounter: 5' (1.524 m).   Weight as of this encounter: 47.628 kg (105 lb). Discharge home with home health Diet - Regular diet Follow up - in 1 week Activity - WBAT Disposition - Home Condition Upon Discharge - Stable D/C Meds - See DC Summary DVT Prophylaxis - Xarelto  PERKINS, ALEXZANDREW 11/29/2013, 7:06 AM

## 2013-11-30 ENCOUNTER — Other Ambulatory Visit: Payer: Self-pay | Admitting: Orthopedic Surgery

## 2013-11-30 ENCOUNTER — Encounter (HOSPITAL_COMMUNITY): Payer: Self-pay | Admitting: *Deleted

## 2013-11-30 ENCOUNTER — Inpatient Hospital Stay (HOSPITAL_COMMUNITY)
Admission: AD | Admit: 2013-11-30 | Discharge: 2013-12-03 | DRG: 481 | Disposition: A | Discharge status: Home Health | Payer: BC Managed Care – PPO | Source: Ambulatory Visit | Attending: Orthopedic Surgery | Admitting: Orthopedic Surgery

## 2013-11-30 DIAGNOSIS — X58XXXA Exposure to other specified factors, initial encounter: Secondary | ICD-10-CM | POA: Diagnosis present

## 2013-11-30 DIAGNOSIS — Z96649 Presence of unspecified artificial hip joint: Secondary | ICD-10-CM

## 2013-11-30 DIAGNOSIS — T8452XS Infection and inflammatory reaction due to internal left hip prosthesis, sequela: Secondary | ICD-10-CM

## 2013-11-30 DIAGNOSIS — S72309A Unspecified fracture of shaft of unspecified femur, initial encounter for closed fracture: Principal | ICD-10-CM | POA: Diagnosis present

## 2013-11-30 DIAGNOSIS — E871 Hypo-osmolality and hyponatremia: Secondary | ICD-10-CM

## 2013-11-30 DIAGNOSIS — E876 Hypokalemia: Secondary | ICD-10-CM | POA: Diagnosis present

## 2013-11-30 DIAGNOSIS — F17200 Nicotine dependence, unspecified, uncomplicated: Secondary | ICD-10-CM | POA: Diagnosis present

## 2013-11-30 DIAGNOSIS — Z79899 Other long term (current) drug therapy: Secondary | ICD-10-CM

## 2013-11-30 DIAGNOSIS — Z8262 Family history of osteoporosis: Secondary | ICD-10-CM

## 2013-11-30 DIAGNOSIS — Y831 Surgical operation with implant of artificial internal device as the cause of abnormal reaction of the patient, or of later complication, without mention of misadventure at the time of the procedure: Secondary | ICD-10-CM | POA: Diagnosis present

## 2013-11-30 DIAGNOSIS — S7222XA Displaced subtrochanteric fracture of left femur, initial encounter for closed fracture: Secondary | ICD-10-CM | POA: Insufficient documentation

## 2013-11-30 DIAGNOSIS — Z8709 Personal history of other diseases of the respiratory system: Secondary | ICD-10-CM

## 2013-11-30 DIAGNOSIS — M810 Age-related osteoporosis without current pathological fracture: Secondary | ICD-10-CM | POA: Diagnosis present

## 2013-11-30 DIAGNOSIS — Z880 Allergy status to penicillin: Secondary | ICD-10-CM

## 2013-11-30 DIAGNOSIS — Z8614 Personal history of Methicillin resistant Staphylococcus aureus infection: Secondary | ICD-10-CM

## 2013-11-30 DIAGNOSIS — T84018A Broken internal joint prosthesis, other site, initial encounter: Secondary | ICD-10-CM

## 2013-11-30 DIAGNOSIS — S7292XA Unspecified fracture of left femur, initial encounter for closed fracture: Secondary | ICD-10-CM

## 2013-11-30 DIAGNOSIS — T8450XA Infection and inflammatory reaction due to unspecified internal joint prosthesis, initial encounter: Secondary | ICD-10-CM | POA: Diagnosis present

## 2013-11-30 DIAGNOSIS — F329 Major depressive disorder, single episode, unspecified: Secondary | ICD-10-CM | POA: Diagnosis present

## 2013-11-30 DIAGNOSIS — D62 Acute posthemorrhagic anemia: Secondary | ICD-10-CM | POA: Diagnosis present

## 2013-11-30 DIAGNOSIS — K219 Gastro-esophageal reflux disease without esophagitis: Secondary | ICD-10-CM | POA: Diagnosis present

## 2013-11-30 DIAGNOSIS — M1990 Unspecified osteoarthritis, unspecified site: Secondary | ICD-10-CM | POA: Diagnosis present

## 2013-11-30 DIAGNOSIS — T8452XA Infection and inflammatory reaction due to internal left hip prosthesis, initial encounter: Secondary | ICD-10-CM

## 2013-11-30 DIAGNOSIS — Z885 Allergy status to narcotic agent status: Secondary | ICD-10-CM

## 2013-11-30 DIAGNOSIS — F3289 Other specified depressive episodes: Secondary | ICD-10-CM | POA: Diagnosis present

## 2013-11-30 LAB — PROTIME-INR
INR: 1.29 (ref 0.00–1.49)
Prothrombin Time: 15.8 seconds — ABNORMAL HIGH (ref 11.6–15.2)

## 2013-11-30 LAB — COMPREHENSIVE METABOLIC PANEL
AST: 45 U/L — ABNORMAL HIGH (ref 0–37)
Albumin: 2.2 g/dL — ABNORMAL LOW (ref 3.5–5.2)
CO2: 24 mEq/L (ref 19–32)
Calcium: 8.7 mg/dL (ref 8.4–10.5)
Creatinine, Ser: 0.45 mg/dL — ABNORMAL LOW (ref 0.50–1.10)
GFR calc Af Amer: >90 mL/min (ref >90)
GFR calc non Af Amer: >90 mL/min (ref >90)
Total Protein: 6.3 g/dL (ref 6.0–8.3)

## 2013-11-30 LAB — CBC WITH DIFFERENTIAL/PLATELET
Basophils Absolute: 0 10*3/uL (ref 0.0–0.1)
Basophils Relative: 0 % (ref 0–1)
Eosinophils Absolute: 0.9 10*3/uL — ABNORMAL HIGH (ref 0.0–0.7)
Eosinophils Relative: 7 % — ABNORMAL HIGH (ref 0–5)
HCT: 26.2 % — ABNORMAL LOW (ref 36.0–46.0)
MCH: 26.3 pg (ref 26.0–34.0)
MCHC: 31.3 g/dL (ref 30.0–36.0)
MCV: 84 fL (ref 78.0–100.0)
Monocytes Absolute: 1 10*3/uL (ref 0.1–1.0)
Monocytes Relative: 7 % (ref 3–12)
Neutro Abs: 9.8 10*3/uL — ABNORMAL HIGH (ref 1.7–7.7)
RDW: 14.4 % (ref 11.5–15.5)
WBC: 13.3 10*3/uL — ABNORMAL HIGH (ref 4.0–10.5)

## 2013-11-30 MED ORDER — DEXTROSE-NACL 5-0.9 % IV SOLN
INTRAVENOUS | Status: DC
  Administered 2013-11-30: 19:00:00 via INTRAVENOUS
  Administered 2013-12-01: 200 mL via INTRAVENOUS

## 2013-11-30 MED ORDER — HYDROMORPHONE HCL PF 1 MG/ML IJ SOLN
0.5000 mg | INTRAMUSCULAR | Status: DC | PRN
  Administered 2013-11-30 – 2013-12-01 (×2): 0.5 mg via INTRAVENOUS
  Filled 2013-11-30 (×2): qty 1

## 2013-11-30 MED ORDER — ALUM & MAG HYDROXIDE-SIMETH 200-200-20 MG/5ML PO SUSP: 30.0000 mL | Freq: Four times a day (QID) | ORAL | Status: DC | PRN

## 2013-11-30 MED ORDER — ALPRAZOLAM 1 MG PO TABS
2.0000 mg | ORAL_TABLET | Freq: Every day | ORAL | Status: DC
  Administered 2013-11-30 – 2013-12-02 (×3): 2 mg via ORAL
  Filled 2013-11-30 (×3): qty 2

## 2013-11-30 MED ORDER — FLEET ENEMA 7-19 GM/118ML RE ENEM: 1.0000 | ENEMA | Freq: Once | RECTAL | Status: AC | PRN

## 2013-11-30 MED ORDER — PROMETHAZINE HCL 25 MG PO TABS: 12.5000 mg | ORAL_TABLET | Freq: Four times a day (QID) | ORAL | Status: DC | PRN

## 2013-11-30 MED ORDER — FLUOXETINE HCL 20 MG PO CAPS
40.0000 mg | ORAL_CAPSULE | Freq: Every day | ORAL | Status: DC
  Administered 2013-12-02 – 2013-12-03 (×2): 40 mg via ORAL
  Filled 2013-11-30 (×3): qty 2

## 2013-11-30 MED ORDER — OMEPRAZOLE 20 MG PO CPDR
20.0000 mg | DELAYED_RELEASE_CAPSULE | Freq: Every day | ORAL | Status: DC
  Administered 2013-12-01 – 2013-12-03 (×3): 20 mg via ORAL
  Filled 2013-11-30 (×3): qty 1

## 2013-11-30 MED ORDER — GABAPENTIN 800 MG PO TABS
800.0000 mg | ORAL_TABLET | Freq: Four times a day (QID) | ORAL | Status: DC
  Filled 2013-11-30: qty 1

## 2013-11-30 MED ORDER — BISACODYL 10 MG RE SUPP: 10.0000 mg | Freq: Every day | RECTAL | Status: DC | PRN

## 2013-11-30 MED ORDER — NON FORMULARY: 20.0000 mg | Freq: Every day | Status: DC

## 2013-11-30 MED ORDER — GABAPENTIN 400 MG PO CAPS
800.0000 mg | ORAL_CAPSULE | Freq: Four times a day (QID) | ORAL | Status: DC
  Administered 2013-11-30 – 2013-12-03 (×7): 800 mg via ORAL
  Filled 2013-11-30 (×13): qty 2

## 2013-11-30 MED ORDER — OXYCODONE HCL 5 MG PO TABS
5.0000 mg | ORAL_TABLET | ORAL | Status: DC | PRN
  Administered 2013-11-30 – 2013-12-01 (×5): 10 mg via ORAL
  Filled 2013-11-30 (×5): qty 2

## 2013-11-30 MED ORDER — DOCUSATE SODIUM 100 MG PO CAPS
100.0000 mg | ORAL_CAPSULE | Freq: Two times a day (BID) | ORAL | Status: DC
  Administered 2013-11-30: 100 mg via ORAL

## 2013-11-30 MED ORDER — POLYETHYLENE GLYCOL 3350 17 G PO PACK: 17.0000 g | PACK | Freq: Every day | ORAL | Status: DC | PRN

## 2013-11-30 MED ORDER — METHOCARBAMOL 500 MG PO TABS
500.0000 mg | ORAL_TABLET | Freq: Four times a day (QID) | ORAL | Status: DC | PRN
Start: 2013-11-30 — End: 2013-12-01

## 2013-11-30 NOTE — H&P (Signed)
[name] is an 62 y.o. female.    DOB: 1951/02/28  Married / Language: English / Race: White  Female  Chief Complaint: Left thigh pain  HPI: Patient is well known to Dr. Lequita Halt having recently underwent a left hip resection and placement of antibiotic spacer requiring a femoral osteotomy and subsequent cabling of the femur.  She had ID consult in the hospitla during her last visit and was discharged home with a PICC line for antibiotic treatment.  A hospital bed was arranged for home use and she was placed into a hip abduction brace due to the repair of the femoral osteotomy for further protection.   Her husband called into the office earlier today stating his wife has increased pain and was concerned about her hip and requested x-rays.  She was brought into the office for evaluation. Her husband who accompanies the patient states that he gave her a pain pill and a xanax prior to bedtime and he went to sleep after getting her settled into bed.  He woke up around 3:30 AM to find his wife standing up near his bed with the upper portion of the brace off and it dangling away from her body only attached by the hip brace causing a valgus torque and pressure to the inner thigh.  He helped her to the bathroom after removing the brace and then back to bed. She was brought into the office today where x-rays showed an angulated femoral shaft fractured at the second cable medially which extends through the lower portion of the femoral osteotomy on the lateral cortex.  Due to significant findings, the patient will be admitted to the hospital and placed at bedrest and preoped for tentative surgery the following day.   Allergies  Augmentin *PENICILLINS*. Nausea, Vomiting.  Morphine Derivatives. Itching.   Past Medical History  Diagnosis Date  . Osteoporosis   . Hiatal hernia   . Depression   . Dislocation of right hip   . Dislocation     pt has had multiple dislocations of both hips   . Arthritis      DDD--HX OF MULTIPLE SPINAL FUSIONS- HAS A LOT OF BACK PAIN; OA -  . GERD (gastroesophageal reflux disease)   . Anxiety   . Electrical shock sensation 1973    DAMAGED BLOOD VESSEL LEFT HAND - VASCULAR SURGERY WAS DONE  . 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 Balance problems     PT RELATES TO ROD IN RT LEG  . Ringing in ears   . 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 Past History  History of ARDS (acute respiratory distress syndrome) following prev. back surgery (11 hours)  Chronic Hepatitis  Menopause  Measles  Mumps  Osteoarthritis  History of MRSA infection (V12.04)   Past Surgical History  Procedure Laterality Date  . Hip surgery    . Knee surgery  1994- 1996    MULTIPE RIGHT KNEE SURGERIES FOR ACL PROBLEMS  . Other surgical history      6 back surgeries - fusions thoracic to lumbar   . Other surgical history      right femur surgery - 3 surgeries - plate knee to hip   . Hip closed reduction  11/30/2011    Procedure: CLOSED REDUCTION HIP;  Surgeon: Shelda Pal;  Location: [facility];  Service: Orthopedics;  Laterality: Right;  . Total hip revision  12/04/2011    Procedure: TOTAL HIP REVISION;  Surgeon: Gus Rankin Aluisio;  Location: [facility];  Service: Orthopedics;  Laterality: Right;  Right Acetabular Revision  . Back surgery  1991 THRU 2004    MULTIPLE ( 6 ) THORACIC AND LUMBAR FUSIONS/ RODS  . Joint replacement      BILATERAL HIP REPLACEMENTS AND BILATERAL HIP REVISIONS.  . Fractured right leg - multiple surgeries-has rod in leg    . Total hip revision Left 11/24/2013    Procedure: RESECTION ARTHROPLASTY WITH ANTIBIOTIC SPACER;  Surgeon: Loanne Drilling, MD;  Location: [facility];  Service: Orthopedics;  Laterality: Left;  WOUND CLASSICICATION- DIRTY   Family History  Father. Deceased, Chronic Renal Failure Syndrome, Colon Cancer, Heart disease. Age 8, Was on dialysis. Bladder Cancer.   Mother. Deceased, Postmenopausal Osteoporosis. Age 24, peforated colon.  Osteoporosis. mother  Osteoarthritis. Mother. mother   Social History  Exercise. Exercises never  Children. 1  Marital status. married  Post-Surgical Plans. Plan to go Home.  Tobacco use. Current every day smoker. current every day smoker; smoke(d) 1 pack(s) per day  Living situation. live with spouse  Number of flights of stairs before winded. 2-3  Current work status. working part time  [employer]. Living Will and Healthcare POA  Alcohol use. Occasional alcohol use. current drinker; drinks wine; only occasionally per week  Social History:  reports that she has been smoking Cigarettes.  She has a 40 pack-year smoking history. She has never used smokeless tobacco. She reports that she drinks alcohol. She reports that she does not use illicit drugs.  Home Medication History  ALPRAZolam (0.5MG  Tablet, Oral) Active.  KlonoPIN (0.5MG  Tablet, Oral) Active.  PROzac (20MG  Capsule, Oral) Active.  Neurontin ( Oral) Specific dose unknown - Active.  Omeprazole (20MG  Capsule DR, Oral) Active.  Percocet ( Oral) Specific dose unknown - Active.    Results for orders placed during the hospital encounter of 11/24/13 (from the past 48 hour(s))  BASIC METABOLIC PANEL     Status: Abnormal   Collection Time    11/29/13  3:18 AM      Result Value Range   Sodium 135  135 - 145 mEq/L   Potassium 3.3 (*) 3.5 - 5.1 mEq/L   Chloride 102  96 - 112 mEq/L   CO2 26  19 - 32 mEq/L   Glucose, Bld 103 (*) 70 - 99 mg/dL   BUN 9  6 - 23 mg/dL   Creatinine, Ser 4.09 (*) 0.50 - 1.10 mg/dL   Calcium 8.4  8.4 - 81.1 mg/dL   GFR calc non Af Amer >90  >90 mL/min   GFR calc Af Amer >90  >90 mL/min   Comment: (NOTE)     The eGFR has been calculated using the CKD EPI equation.     This calculation has not been validated in all clinical situations.     eGFR's persistently <90 mL/min signify possible Chronic Kidney     Disease.  CBC      Status: Abnormal   Collection Time    11/29/13  3:18 AM      Result Value Range   WBC 14.3 (*) 4.0 - 10.5 K/uL   RBC 2.78 (*) 3.87 - 5.11 MIL/uL   Hemoglobin 7.4 (*) 12.0 - 15.0 g/dL   HCT 91.4 (*) 78.2 - 95.6 %   MCV 85.3  78.0 - 100.0 fL   MCH 26.6  26.0 - 34.0  pg   MCHC 31.2  30.0 - 36.0 g/dL   RDW 16.1  09.6 - 04.5 %   Platelets 468 (*) 150 - 400 K/uL   No results found.  ROS General: Present- Night Sweats. Not Present- Chills, Fever, Appetite Loss, Fatigue, Feeling sick, Weight Gain and Weight Loss.  Skin: Not Present- Itching, Rash, Skin Color Changes, Ulcer, Psoriasis and Change in Hair or Nails.  HEENT: Present- Ringing in the Ears. Not Present- Sensitivity to light, Hearing problems and Nose Bleed.  Neck: Not Present- Swollen Glands and Neck Mass.  Respiratory: Not Present- Snoring, Chronic Cough, Bloody sputum and Dyspnea.  Cardiovascular: Not Present- Shortness of Breath, Chest Pain, Swelling of Extremities, Leg Cramps and Palpitations.  Gastrointestinal: Not Present- Bloody Stool, Heartburn, Abdominal Pain, Vomiting, Nausea and Incontinence of Stool.  Female Genitourinary: Not Present- Blood in Urine, Menstrual Irregularities, Frequency, Incontinence and Nocturia.  Musculoskeletal: Present- Joint Pain and thigh pain, Back Pain and Spasms. Not Present- Muscle Weakness,  Joint Stiffness and Joint Swelling.  Neurological: Not Present- Tingling, Numbness, Burning, Tremor, Headaches and Dizziness.  Psychiatric: Not Present- Anxiety, Depression and Memory Loss.  Endocrine: Not Present- Cold Intolerance, Heat Intolerance, Excessive hunger and Excessive Thirst.  Hematology: Not Present- Abnormal Bleeding, Anemia, Blood Clots and Easy Bruising.     Physical Exam  The physical exam findings are as follows:  Note: Patient is a 62 year old white, petite framed female with continued right hip problems.  General  Mental Status - Alert. General Appearance - In acute distress.  Orientation - Oriented X3. Build & Nutrition - Lean, Dewar, Well nourished and Well developed.  Head and Neck  Head - normocephalic, atraumatic .  Neck  Global Assessment - supple. no bruit auscultated on the right and no bruit auscultated on the left.  Eye  Pupil - Bilateral - Regular and Round.  Motion - Bilateral - EOMI.  Chest and Lung Exam  Auscultation:  Breath sounds: - clear at anterior chest wall and - clear at posterior chest wall.  Adventitious sounds: - No Adventitious sounds.  Cardiovascular  Auscultation: Rhythm - Regular rate and rhythm. Heart Sounds - S1 WNL and S2 WNL.  Murmurs & Other Heart Sounds:  Murmur 1: Location - Aortic Area and Sternal Border - Left. Timing - Holosystolic. Grade - III/VI.  Abdomen  Palpation/Percussion: Palpation and Percussion of the abdomen reveal - Non Tender and Soft. Note: Flat Rigidity (guarding) - Abdomen is soft.  Auscultation: Auscultation of the abdomen reveals - Bowel sounds normal.  Female Genitourinary  Not done, not pertinent to present illness  Musculoskeletal  She is in distress at this time due to the fracture. There is some swelling laterally about the left  thigh. Moderate pain on any movement of the left leg.  Exteremly difficult getting onto the x-ray table. Hip abduction brace was in place.  RADIOGRAPHS:  AP of the left hip show the antibiotic cement spacer with mid shaft vargus angulated femoral shaft fracture at the level of the second cable medially with the fracture extending through to the distal portion of the femoral osteotomy on the lateral cortex.    Assessment/Plan Angulated Left Femoral Shaft Fracture, Periprosthetic  She will be admitted to [facility] and placed at bedrest tonight.  She will have her labs rechecked and preoped for surgery later tomorrow afternoon.  She will be allowed to eat tonight, have clear liquids in the morning on Wednesday 12/01/2013, and then NPO after breakfast  tomorrow. She has  a PICC line which can be accessed on admission for fluids.  She can resume her antibiotics as taking at home.  Welden Hausmann 11/30/2013, 5:21 PM

## 2013-11-30 NOTE — Progress Notes (Signed)
11/30/13 Nursing 2000 Avel Peace PA notified reg: patient's refusal to wear abduction brace tonight. Also notified of call to [facility] regarding removal of brace. Patient made aware again of the medical necessity to wearing the brace per Avel Peace PA.Marland Kitchen Patient still refusing. Order received to use knee immobilizer with the upper edge near the crotch area to support the femur fracture. Knee immobilizer applied to patient.

## 2013-11-30 NOTE — Progress Notes (Signed)
11/30/13 Nursing 1945 [name] called reg: patient demanding that hip abduction brace be removed at this time. Patient refusing to wear brace despite education from nurse detailing the importance of wearing the brace so as to immobilize left hip and stabilize l femur fracture.  Patient states she will remove it herself if need be. Orders received to remove hip abduction brace at this time. Orders for bucks traction if patient c/o of continuing pain to l leg. Hip abduction brace removed at this time.

## 2013-12-01 ENCOUNTER — Encounter (HOSPITAL_COMMUNITY): Payer: Self-pay | Admitting: Registered Nurse

## 2013-12-01 ENCOUNTER — Inpatient Hospital Stay (HOSPITAL_COMMUNITY): Payer: BC Managed Care – PPO | Admitting: Anesthesiology

## 2013-12-01 ENCOUNTER — Encounter (HOSPITAL_COMMUNITY)
Admission: AD | Disposition: A | Discharge status: Home Health | Payer: Self-pay | Source: Ambulatory Visit | Attending: Orthopedic Surgery

## 2013-12-01 ENCOUNTER — Encounter (HOSPITAL_COMMUNITY): Payer: BC Managed Care – PPO | Admitting: Anesthesiology

## 2013-12-01 ENCOUNTER — Inpatient Hospital Stay (HOSPITAL_COMMUNITY): Payer: BC Managed Care – PPO

## 2013-12-01 HISTORY — PX: ORIF PERIPROSTHETIC FRACTURE: SHX5034

## 2013-12-01 LAB — BASIC METABOLIC PANEL
BUN: 8 mg/dL (ref 6–23)
CO2: 24 mEq/L (ref 19–32)
Calcium: 8.2 mg/dL — ABNORMAL LOW (ref 8.4–10.5)
GFR calc non Af Amer: >90 mL/min (ref >90)
Glucose, Bld: 91 mg/dL (ref 70–99)
Sodium: 136 mEq/L (ref 135–145)

## 2013-12-01 LAB — CBC
HCT: 22.5 % — ABNORMAL LOW (ref 36.0–46.0)
MCH: 26.1 pg (ref 26.0–34.0)
MCHC: 31.1 g/dL (ref 30.0–36.0)
MCV: 84 fL (ref 78.0–100.0)
Platelets: 595 10*3/uL — ABNORMAL HIGH (ref 150–400)
RBC: 2.68 MIL/uL — ABNORMAL LOW (ref 3.87–5.11)

## 2013-12-01 LAB — PREPARE RBC (CROSSMATCH)

## 2013-12-01 SURGERY — OPEN REDUCTION INTERNAL FIXATION (ORIF) PERIPROSTHETIC FRACTURE
Anesthesia: General | Site: Hip | Laterality: Left

## 2013-12-01 MED ORDER — PROPOFOL 10 MG/ML IV BOLUS
INTRAVENOUS | Status: AC
  Filled 2013-12-01: qty 20

## 2013-12-01 MED ORDER — METOCLOPRAMIDE HCL 10 MG PO TABS: 5.0000 mg | ORAL_TABLET | Freq: Three times a day (TID) | ORAL | Status: DC | PRN

## 2013-12-01 MED ORDER — SODIUM CHLORIDE 0.9 % IJ SOLN: Freq: Once | INTRAMUSCULAR | Status: DC

## 2013-12-01 MED ORDER — LACTATED RINGERS IV SOLN: INTRAVENOUS | Status: DC

## 2013-12-01 MED ORDER — MIDAZOLAM HCL 5 MG/5ML IJ SOLN
INTRAMUSCULAR | Status: DC | PRN
  Administered 2013-12-01 (×2): 1 mg via INTRAVENOUS

## 2013-12-01 MED ORDER — HYDROMORPHONE HCL PF 1 MG/ML IJ SOLN
0.5000 mg | INTRAMUSCULAR | Status: DC | PRN
  Administered 2013-12-01 – 2013-12-02 (×3): 1 mg via INTRAVENOUS
  Filled 2013-12-01 (×3): qty 1

## 2013-12-01 MED ORDER — RIVAROXABAN 10 MG PO TABS
10.0000 mg | ORAL_TABLET | Freq: Every day | ORAL | Status: DC
  Administered 2013-12-02 – 2013-12-03 (×2): 10 mg via ORAL
  Filled 2013-12-01 (×3): qty 1

## 2013-12-01 MED ORDER — HYDROMORPHONE HCL PF 1 MG/ML IJ SOLN
INTRAMUSCULAR | Status: AC
  Filled 2013-12-01: qty 1

## 2013-12-01 MED ORDER — SUCCINYLCHOLINE CHLORIDE 20 MG/ML IJ SOLN
INTRAMUSCULAR | Status: DC | PRN
  Administered 2013-12-01: 80 mg via INTRAVENOUS

## 2013-12-01 MED ORDER — VANCOMYCIN HCL 1000 MG IV SOLR
1000.0000 mg | INTRAVENOUS | Status: DC | PRN
  Administered 2013-12-01: 1000 mg via INTRAVENOUS

## 2013-12-01 MED ORDER — FLEET ENEMA 7-19 GM/118ML RE ENEM: 1.0000 | ENEMA | Freq: Once | RECTAL | Status: AC | PRN

## 2013-12-01 MED ORDER — METOCLOPRAMIDE HCL 5 MG/ML IJ SOLN: 5.0000 mg | Freq: Three times a day (TID) | INTRAMUSCULAR | Status: DC | PRN

## 2013-12-01 MED ORDER — OXYCODONE HCL 5 MG PO TABS
5.0000 mg | ORAL_TABLET | ORAL | Status: DC | PRN
  Administered 2013-12-01 – 2013-12-03 (×11): 10 mg via ORAL
  Filled 2013-12-01 (×11): qty 2

## 2013-12-01 MED ORDER — DOCUSATE SODIUM 100 MG PO CAPS
100.0000 mg | ORAL_CAPSULE | Freq: Two times a day (BID) | ORAL | Status: DC
  Administered 2013-12-02 – 2013-12-03 (×3): 100 mg via ORAL

## 2013-12-01 MED ORDER — METHOCARBAMOL 100 MG/ML IJ SOLN
500.0000 mg | Freq: Four times a day (QID) | INTRAVENOUS | Status: DC | PRN
  Administered 2013-12-01: 500 mg via INTRAVENOUS
  Filled 2013-12-01 (×2): qty 5

## 2013-12-01 MED ORDER — ACETAMINOPHEN 650 MG RE SUPP: 650.0000 mg | Freq: Four times a day (QID) | RECTAL | Status: DC | PRN

## 2013-12-01 MED ORDER — SODIUM CHLORIDE 0.9 % IJ SOLN
INTRAMUSCULAR | Status: AC
  Filled 2013-12-01: qty 50

## 2013-12-01 MED ORDER — BUPIVACAINE HCL (PF) 0.25 % IJ SOLN
INTRAMUSCULAR | Status: AC
  Filled 2013-12-01: qty 30

## 2013-12-01 MED ORDER — KCL IN DEXTROSE-NACL 20-5-0.9 MEQ/L-%-% IV SOLN
INTRAVENOUS | Status: DC
  Administered 2013-12-02: 06:00:00 via INTRAVENOUS
  Filled 2013-12-01 (×2): qty 1000

## 2013-12-01 MED ORDER — TRAMADOL HCL 50 MG PO TABS
50.0000 mg | ORAL_TABLET | Freq: Four times a day (QID) | ORAL | Status: DC | PRN
  Administered 2013-12-02 – 2013-12-03 (×3): 100 mg via ORAL
  Filled 2013-12-01 (×3): qty 2

## 2013-12-01 MED ORDER — LACTATED RINGERS IV SOLN
INTRAVENOUS | Status: DC | PRN
  Administered 2013-12-01: 18:00:00 via INTRAVENOUS

## 2013-12-01 MED ORDER — PHENOL 1.4 % MT LIQD: 1.0000 | OROMUCOSAL | Status: DC | PRN

## 2013-12-01 MED ORDER — POLYETHYLENE GLYCOL 3350 17 G PO PACK: 17.0000 g | PACK | Freq: Every day | ORAL | Status: DC | PRN

## 2013-12-01 MED ORDER — FENTANYL CITRATE 0.05 MG/ML IJ SOLN
INTRAMUSCULAR | Status: DC | PRN
  Administered 2013-12-01: 100 ug via INTRAVENOUS
  Administered 2013-12-01: 50 ug via INTRAVENOUS
  Administered 2013-12-01: 100 ug via INTRAVENOUS

## 2013-12-01 MED ORDER — FENTANYL CITRATE 0.05 MG/ML IJ SOLN
INTRAMUSCULAR | Status: AC
  Filled 2013-12-01: qty 2

## 2013-12-01 MED ORDER — ONDANSETRON HCL 4 MG/2ML IJ SOLN: 4.0000 mg | Freq: Four times a day (QID) | INTRAMUSCULAR | Status: DC | PRN

## 2013-12-01 MED ORDER — HYDROMORPHONE HCL PF 1 MG/ML IJ SOLN
0.2500 mg | INTRAMUSCULAR | Status: DC | PRN
  Administered 2013-12-01 (×4): 0.5 mg via INTRAVENOUS

## 2013-12-01 MED ORDER — ONDANSETRON HCL 4 MG/2ML IJ SOLN
INTRAMUSCULAR | Status: AC
  Filled 2013-12-01: qty 2

## 2013-12-01 MED ORDER — ONDANSETRON HCL 4 MG PO TABS: 4.0000 mg | ORAL_TABLET | Freq: Four times a day (QID) | ORAL | Status: DC | PRN

## 2013-12-01 MED ORDER — DEXAMETHASONE SODIUM PHOSPHATE 10 MG/ML IJ SOLN
INTRAMUSCULAR | Status: AC
  Filled 2013-12-01: qty 1

## 2013-12-01 MED ORDER — LIDOCAINE HCL (CARDIAC) 20 MG/ML IV SOLN
INTRAVENOUS | Status: AC
  Filled 2013-12-01: qty 5

## 2013-12-01 MED ORDER — BUPIVACAINE LIPOSOME 1.3 % IJ SUSP
20.0000 mL | Freq: Once | INTRAMUSCULAR | Status: DC
  Filled 2013-12-01: qty 20

## 2013-12-01 MED ORDER — ACETAMINOPHEN 325 MG PO TABS: 650.0000 mg | ORAL_TABLET | Freq: Four times a day (QID) | ORAL | Status: DC | PRN

## 2013-12-01 MED ORDER — ACETAMINOPHEN 500 MG PO TABS
1000.0000 mg | ORAL_TABLET | Freq: Four times a day (QID) | ORAL | Status: AC
  Administered 2013-12-01 – 2013-12-02 (×4): 1000 mg via ORAL
  Filled 2013-12-01 (×5): qty 2

## 2013-12-01 MED ORDER — SODIUM CHLORIDE 0.9 % IV SOLN
INTRAVENOUS | Status: DC | PRN
  Administered 2013-12-01: 19:00:00 via INTRAVENOUS

## 2013-12-01 MED ORDER — FENTANYL CITRATE 0.05 MG/ML IJ SOLN
25.0000 ug | INTRAMUSCULAR | Status: DC | PRN
  Administered 2013-12-01 (×2): 50 ug via INTRAVENOUS

## 2013-12-01 MED ORDER — SODIUM CHLORIDE 0.9 % IV SOLN
1000.0000 mg | Freq: Two times a day (BID) | INTRAVENOUS | Status: DC
  Filled 2013-12-01 (×2): qty 1000

## 2013-12-01 MED ORDER — ROCURONIUM BROMIDE 100 MG/10ML IV SOLN
INTRAVENOUS | Status: AC
  Filled 2013-12-01: qty 1

## 2013-12-01 MED ORDER — METHOCARBAMOL 500 MG PO TABS
500.0000 mg | ORAL_TABLET | Freq: Four times a day (QID) | ORAL | Status: DC | PRN
  Administered 2013-12-02 – 2013-12-03 (×3): 500 mg via ORAL
  Filled 2013-12-01 (×3): qty 1

## 2013-12-01 MED ORDER — VANCOMYCIN HCL IN DEXTROSE 1-5 GM/200ML-% IV SOLN
INTRAVENOUS | Status: AC
  Filled 2013-12-01: qty 200

## 2013-12-01 MED ORDER — MIDAZOLAM HCL 2 MG/2ML IJ SOLN
INTRAMUSCULAR | Status: AC
  Filled 2013-12-01: qty 2

## 2013-12-01 MED ORDER — FENTANYL CITRATE 0.05 MG/ML IJ SOLN
INTRAMUSCULAR | Status: AC
  Filled 2013-12-01: qty 5

## 2013-12-01 MED ORDER — 0.9 % SODIUM CHLORIDE (POUR BTL) OPTIME
TOPICAL | Status: DC | PRN
  Administered 2013-12-01: 1000 mL

## 2013-12-01 MED ORDER — BISACODYL 10 MG RE SUPP: 10.0000 mg | Freq: Every day | RECTAL | Status: DC | PRN

## 2013-12-01 MED ORDER — MENTHOL 3 MG MT LOZG: 1.0000 | LOZENGE | OROMUCOSAL | Status: DC | PRN

## 2013-12-01 MED ORDER — ROCURONIUM BROMIDE 100 MG/10ML IV SOLN
INTRAVENOUS | Status: DC | PRN
  Administered 2013-12-01: 5 mg via INTRAVENOUS

## 2013-12-01 MED ORDER — SUCCINYLCHOLINE CHLORIDE 20 MG/ML IJ SOLN
INTRAMUSCULAR | Status: AC
  Filled 2013-12-01: qty 1

## 2013-12-01 MED ORDER — DIPHENHYDRAMINE HCL 12.5 MG/5ML PO ELIX
12.5000 mg | ORAL_SOLUTION | ORAL | Status: DC | PRN
  Administered 2013-12-01: 12.5 mg via ORAL
  Filled 2013-12-01: qty 5

## 2013-12-01 MED ORDER — LIDOCAINE HCL (CARDIAC) 20 MG/ML IV SOLN
INTRAVENOUS | Status: DC | PRN
  Administered 2013-12-01: 50 mg via INTRAVENOUS

## 2013-12-01 MED ORDER — PHENYLEPHRINE 40 MCG/ML (10ML) SYRINGE FOR IV PUSH (FOR BLOOD PRESSURE SUPPORT)
PREFILLED_SYRINGE | INTRAVENOUS | Status: AC
  Filled 2013-12-01: qty 10

## 2013-12-01 MED ORDER — PROPOFOL 10 MG/ML IV BOLUS
INTRAVENOUS | Status: DC | PRN
  Administered 2013-12-01: 100 mg via INTRAVENOUS

## 2013-12-01 SURGICAL SUPPLY — 57 items
BAG ZIPLOCK 12X15 (MISCELLANEOUS) ×2 IMPLANT
CABLE FOR BONE PLATE (Cable) ×10 IMPLANT
DRAPE INCISE IOBAN 66X45 STRL (DRAPES) ×4 IMPLANT
DRAPE ORTHO SPLIT 77X108 STRL (DRAPES) ×2
DRAPE POUCH INSTRU U-SHP 10X18 (DRAPES) ×2 IMPLANT
DRAPE SURG 17X11 SM STRL (DRAPES) IMPLANT
DRAPE SURG ORHT 6 SPLT 77X108 (DRAPES) ×2 IMPLANT
DRAPE U-SHAPE 47X51 STRL (DRAPES) ×2 IMPLANT
DRSG EMULSION OIL 3X16 NADH (GAUZE/BANDAGES/DRESSINGS) ×2 IMPLANT
DRSG MEPILEX BORDER 4X12 (GAUZE/BANDAGES/DRESSINGS) ×2 IMPLANT
DRSG MEPILEX BORDER 4X4 (GAUZE/BANDAGES/DRESSINGS) ×2 IMPLANT
DRSG MEPILEX BORDER 4X8 (GAUZE/BANDAGES/DRESSINGS) ×2 IMPLANT
DURAPREP 26ML APPLICATOR (WOUND CARE) ×4 IMPLANT
ELECT BLADE TIP CTD 4 INCH (ELECTRODE) ×2 IMPLANT
ELECT REM PT RETURN 9FT ADLT (ELECTROSURGICAL) ×2
ELECTRODE REM PT RTRN 9FT ADLT (ELECTROSURGICAL) ×1 IMPLANT
EVACUATOR 1/8 PVC DRAIN (DRAIN) ×2 IMPLANT
FACESHIELD LNG OPTICON STERILE (SAFETY) ×8 IMPLANT
GLOVE BIO SURGEON STRL SZ7.5 (GLOVE) ×2 IMPLANT
GLOVE BIO SURGEON STRL SZ8 (GLOVE) ×2 IMPLANT
GLOVE BIOGEL PI IND STRL 7.0 (GLOVE) ×1 IMPLANT
GLOVE BIOGEL PI IND STRL 7.5 (GLOVE) ×1 IMPLANT
GLOVE BIOGEL PI IND STRL 8 (GLOVE) ×1 IMPLANT
GLOVE BIOGEL PI INDICATOR 7.0 (GLOVE) ×1
GLOVE BIOGEL PI INDICATOR 7.5 (GLOVE) ×1
GLOVE BIOGEL PI INDICATOR 8 (GLOVE) ×1
GLOVE ORTHO TXT STRL SZ7.5 (GLOVE) ×2 IMPLANT
GLOVE SURG SS PI 6.5 STRL IVOR (GLOVE) ×2 IMPLANT
GLOVE SURG SS PI 7.5 STRL IVOR (GLOVE) ×2 IMPLANT
GOWN PREVENTION PLUS LG XLONG (DISPOSABLE) ×6 IMPLANT
GOWN STRL REIN XL XLG (GOWN DISPOSABLE) ×2 IMPLANT
IMMOBILIZER KNEE 20 (SOFTGOODS)
IMMOBILIZER KNEE 20 THIGH 36 (SOFTGOODS) IMPLANT
KIT BASIN OR (CUSTOM PROCEDURE TRAY) ×2 IMPLANT
MANIFOLD NEPTUNE II (INSTRUMENTS) ×2 IMPLANT
NS IRRIG 1000ML POUR BTL (IV SOLUTION) ×2 IMPLANT
PACK TOTAL JOINT (CUSTOM PROCEDURE TRAY) ×2 IMPLANT
PAD ABD 8X10 STRL (GAUZE/BANDAGES/DRESSINGS) IMPLANT
PASSER SUT SWANSON 36MM LOOP (INSTRUMENTS) IMPLANT
PLATE CABLE BONE 6HOLE (Plate) ×2 IMPLANT
POSITIONER SURGICAL ARM (MISCELLANEOUS) ×2 IMPLANT
SCREW 4.5X34 (Screw) ×2 IMPLANT
SPONGE GAUZE 4X4 12PLY (GAUZE/BANDAGES/DRESSINGS) IMPLANT
SPONGE LAP 18X18 X RAY DECT (DISPOSABLE) IMPLANT
SPONGE LAP 4X18 X RAY DECT (DISPOSABLE) IMPLANT
STAPLER VISISTAT 35W (STAPLE) ×4 IMPLANT
STRIP CLOSURE SKIN 1/2X4 (GAUZE/BANDAGES/DRESSINGS) IMPLANT
SUCTION FRAZIER TIP 10 FR DISP (SUCTIONS) ×2 IMPLANT
SUT ETHIBOND NAB CT1 #1 30IN (SUTURE) IMPLANT
SUT MNCRL AB 4-0 PS2 18 (SUTURE) IMPLANT
SUT VIC AB 1 CT1 27 (SUTURE) ×3
SUT VIC AB 1 CT1 27XBRD ANTBC (SUTURE) ×3 IMPLANT
SUT VIC AB 2-0 CT1 27 (SUTURE) ×3
SUT VIC AB 2-0 CT1 TAPERPNT 27 (SUTURE) ×3 IMPLANT
TOWEL OR 17X26 10 PK STRL BLUE (TOWEL DISPOSABLE) ×4 IMPLANT
TRAY FOLEY CATH 14FRSI W/METER (CATHETERS) ×2 IMPLANT
WATER STERILE IRR 1500ML POUR (IV SOLUTION) IMPLANT

## 2013-12-01 NOTE — Interval H&P Note (Signed)
History and Physical Interval Note:  12/01/2013 5:45 PM  [name]  has presented today for surgery, with the diagnosis of left periprosthetic fracture  The various methods of treatment have been discussed with the patient and family. After consideration of risks, benefits and other options for treatment, the patient has consented to  Procedure(s): OPEN REDUCTION INTERNAL FIXATION (ORIF) LEFT PERIPROSTHETIC FRACTURE OF THE FEMUR (Left) as a surgical intervention .  The patient's history has been reviewed, patient examined, no change in status, stable for surgery.  I have reviewed the patient's chart and labs.  Questions were answered to the patient's satisfaction.     Loanne Drilling

## 2013-12-01 NOTE — Anesthesia Postprocedure Evaluation (Signed)
  Anesthesia Post-op Note  Patient: [name]  Procedure(s) Performed: Procedure(s) (LRB): OPEN REDUCTION INTERNAL FIXATION (ORIF) LEFT PERIPROSTHETIC FRACTURE OF THE FEMUR WITH CABLES AND PLATE (Left)  Patient Location: PACU  Anesthesia Type: General  Level of Consciousness: awake and alert   Airway and Oxygen Therapy: Patient Spontanous Breathing  Post-op Pain: mild  Post-op Assessment: Post-op Vital signs reviewed, Patient's Cardiovascular Status Stable, Respiratory Function Stable, Patent Airway and No signs of Nausea or vomiting  Last Vitals:  Filed Vitals:   12/01/13 2045  BP: 151/95  Pulse: 89  Temp: 36.9 C  Resp: 19    Post-op Vital Signs: stable   Complications: No apparent anesthesia complications

## 2013-12-01 NOTE — H&P (View-Only) (Signed)
[name] is an 62 y.o. female.    DOB: 10/26/1951  Married / Language: English / Race: White  Female  Chief Complaint: Left thigh pain  HPI: Patient is well known to Dr. Aluisio having recently underwent a left hip resection and placement of antibiotic spacer requiring a femoral osteotomy and subsequent cabling of the femur.  She had ID consult in the hospitla during her last visit and was discharged home with a PICC line for antibiotic treatment.  A hospital bed was arranged for home use and she was placed into a hip abduction brace due to the repair of the femoral osteotomy for further protection.   Her husband called into the office earlier today stating his wife has increased pain and was concerned about her hip and requested x-rays.  She was brought into the office for evaluation. Her husband who accompanies the patient states that he gave her a pain pill and a xanax prior to bedtime and he went to sleep after getting her settled into bed.  He woke up around 3:30 AM to find his wife standing up near his bed with the upper portion of the brace off and it dangling away from her body only attached by the hip brace causing a valgus torque and pressure to the inner thigh.  He helped her to the bathroom after removing the brace and then back to bed. She was brought into the office today where x-rays showed an angulated femoral shaft fractured at the second cable medially which extends through the lower portion of the femoral osteotomy on the lateral cortex.  Due to significant findings, the patient will be admitted to the hospital and placed at bedrest and preoped for tentative surgery the following day.   Allergies  Augmentin *PENICILLINS*. Nausea, Vomiting.  Morphine Derivatives. Itching.   Past Medical History  Diagnosis Date  . Osteoporosis   . Hiatal hernia   . Depression   . Dislocation of right hip   . Dislocation     pt has had multiple dislocations of both hips   . Arthritis      DDD--HX OF MULTIPLE SPINAL FUSIONS- HAS A LOT OF BACK PAIN; OA -  . GERD (gastroesophageal reflux disease)   . Anxiety   . Electrical shock sensation 1973    DAMAGED BLOOD VESSEL LEFT HAND - VASCULAR SURGERY WAS DONE  . 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 Balance problems     PT RELATES TO ROD IN RT LEG  . Ringing in ears   . 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 Past History  History of ARDS (acute respiratory distress syndrome) following prev. back surgery (11 hours)  Chronic Hepatitis  Menopause  Measles  Mumps  Osteoarthritis  History of MRSA infection (V12.04)   Past Surgical History  Procedure Laterality Date  . Hip surgery    . Knee surgery  1994- 1996    MULTIPE RIGHT KNEE SURGERIES FOR ACL PROBLEMS  . Other surgical history      6 back surgeries - fusions thoracic to lumbar   . Other surgical history      right femur surgery - 3 surgeries - plate knee to hip   . Hip closed reduction  11/30/2011    Procedure: CLOSED REDUCTION HIP;  Surgeon: Matthew D Olin;  Location: [facility];  Service: Orthopedics;    Laterality: Right;  . Total hip revision  12/04/2011    Procedure: TOTAL HIP REVISION;  Surgeon: Frank V Aluisio;  Location: [facility];  Service: Orthopedics;  Laterality: Right;  Right Acetabular Revision  . Back surgery  1991 THRU 2004    MULTIPLE ( 6 ) THORACIC AND LUMBAR FUSIONS/ RODS  . Joint replacement      BILATERAL HIP REPLACEMENTS AND BILATERAL HIP REVISIONS.  . Fractured right leg - multiple surgeries-has rod in leg    . Total hip revision Left 11/24/2013    Procedure: RESECTION ARTHROPLASTY WITH ANTIBIOTIC SPACER;  Surgeon: Frank V Aluisio, MD;  Location: [facility];  Service: Orthopedics;  Laterality: Left;  WOUND CLASSICICATION- DIRTY   Family History  Father. Deceased, Chronic Renal Failure Syndrome, Colon Cancer, Heart disease. Age 84, Was on dialysis. Bladder Cancer.   Mother. Deceased, Postmenopausal Osteoporosis. Age 81, peforated colon.  Osteoporosis. mother  Osteoarthritis. Mother. mother   Social History  Exercise. Exercises never  Children. 1  Marital status. married  Post-Surgical Plans. Plan to go Home.  Tobacco use. Current every day smoker. current every day smoker; smoke(d) 1 pack(s) per day  Living situation. live with spouse  Number of flights of stairs before winded. 2-3  Current work status. working part time  Advance Directives. Living Will and Healthcare POA  Alcohol use. Occasional alcohol use. current drinker; drinks wine; only occasionally per week  Social History:  reports that she has been smoking Cigarettes.  She has a 40 pack-year smoking history. She has never used smokeless tobacco. She reports that she drinks alcohol. She reports that she does not use illicit drugs.  Home Medication History  ALPRAZolam (0.5MG Tablet, Oral) Active.  KlonoPIN (0.5MG Tablet, Oral) Active.  PROzac (20MG Capsule, Oral) Active.  Neurontin ( Oral) Specific dose unknown - Active.  Omeprazole (20MG Capsule DR, Oral) Active.  Percocet ( Oral) Specific dose unknown - Active.    Results for orders placed during the hospital encounter of 11/24/13 (from the past 48 hour(s))  BASIC METABOLIC PANEL     Status: Abnormal   Collection Time    11/29/13  3:18 AM      Result Value Range   Sodium 135  135 - 145 mEq/L   Potassium 3.3 (*) 3.5 - 5.1 mEq/L   Chloride 102  96 - 112 mEq/L   CO2 26  19 - 32 mEq/L   Glucose, Bld 103 (*) 70 - 99 mg/dL   BUN 9  6 - 23 mg/dL   Creatinine, Ser 0.48 (*) 0.50 - 1.10 mg/dL   Calcium 8.4  8.4 - 10.5 mg/dL   GFR calc non Af Amer >90  >90 mL/min   GFR calc Af Amer >90  >90 mL/min   Comment: (NOTE)     The eGFR has been calculated using the CKD EPI equation.     This calculation has not been validated in all clinical situations.     eGFR's persistently <90 mL/min signify possible Chronic Kidney     Disease.  CBC      Status: Abnormal   Collection Time    11/29/13  3:18 AM      Result Value Range   WBC 14.3 (*) 4.0 - 10.5 K/uL   RBC 2.78 (*) 3.87 - 5.11 MIL/uL   Hemoglobin 7.4 (*) 12.0 - 15.0 g/dL   HCT 23.7 (*) 36.0 - 46.0 %   MCV 85.3  78.0 - 100.0 fL   MCH 26.6  26.0 - 34.0   pg   MCHC 31.2  30.0 - 36.0 g/dL   RDW 14.6  11.5 - 15.5 %   Platelets 468 (*) 150 - 400 K/uL   No results found.  ROS General: Present- Night Sweats. Not Present- Chills, Fever, Appetite Loss, Fatigue, Feeling sick, Weight Gain and Weight Loss.  Skin: Not Present- Itching, Rash, Skin Color Changes, Ulcer, Psoriasis and Change in Hair or Nails.  HEENT: Present- Ringing in the Ears. Not Present- Sensitivity to light, Hearing problems and Nose Bleed.  Neck: Not Present- Swollen Glands and Neck Mass.  Respiratory: Not Present- Snoring, Chronic Cough, Bloody sputum and Dyspnea.  Cardiovascular: Not Present- Shortness of Breath, Chest Pain, Swelling of Extremities, Leg Cramps and Palpitations.  Gastrointestinal: Not Present- Bloody Stool, Heartburn, Abdominal Pain, Vomiting, Nausea and Incontinence of Stool.  Female Genitourinary: Not Present- Blood in Urine, Menstrual Irregularities, Frequency, Incontinence and Nocturia.  Musculoskeletal: Present- Joint Pain and thigh pain, Back Pain and Spasms. Not Present- Muscle Weakness,  Joint Stiffness and Joint Swelling.  Neurological: Not Present- Tingling, Numbness, Burning, Tremor, Headaches and Dizziness.  Psychiatric: Not Present- Anxiety, Depression and Memory Loss.  Endocrine: Not Present- Cold Intolerance, Heat Intolerance, Excessive hunger and Excessive Thirst.  Hematology: Not Present- Abnormal Bleeding, Anemia, Blood Clots and Easy Bruising.     Physical Exam  The physical exam findings are as follows:  Note: Patient is a 62 year old white, petite framed female with continued right hip problems.  General  Mental Status - Alert. General Appearance - In acute distress.  Orientation - Oriented X3. Build & Nutrition - Lean, Petite, Well nourished and Well developed.  Head and Neck  Head - normocephalic, atraumatic .  Neck  Global Assessment - supple. no bruit auscultated on the right and no bruit auscultated on the left.  Eye  Pupil - Bilateral - Regular and Round.  Motion - Bilateral - EOMI.  Chest and Lung Exam  Auscultation:  Breath sounds: - clear at anterior chest wall and - clear at posterior chest wall.  Adventitious sounds: - No Adventitious sounds.  Cardiovascular  Auscultation: Rhythm - Regular rate and rhythm. Heart Sounds - S1 WNL and S2 WNL.  Murmurs & Other Heart Sounds:  Murmur 1: Location - Aortic Area and Sternal Border - Left. Timing - Holosystolic. Grade - III/VI.  Abdomen  Palpation/Percussion: Palpation and Percussion of the abdomen reveal - Non Tender and Soft. Note: Flat Rigidity (guarding) - Abdomen is soft.  Auscultation: Auscultation of the abdomen reveals - Bowel sounds normal.  Female Genitourinary  Not done, not pertinent to present illness  Musculoskeletal  She is in distress at this time due to the fracture. There is some swelling laterally about the left  thigh. Moderate pain on any movement of the left leg.  Exteremly difficult getting onto the x-ray table. Hip abduction brace was in place.  RADIOGRAPHS:  AP of the left hip show the antibiotic cement spacer with mid shaft vargus angulated femoral shaft fracture at the level of the second cable medially with the fracture extending through to the distal portion of the femoral osteotomy on the lateral cortex.    Assessment/Plan Angulated Left Femoral Shaft Fracture, Periprosthetic  She will be admitted to [facility] and placed at bedrest tonight.  She will have her labs rechecked and preoped for surgery later tomorrow afternoon.  She will be allowed to eat tonight, have clear liquids in the morning on Wednesday 12/01/2013, and then NPO after breakfast  tomorrow. She has   a PICC line which can be accessed on admission for fluids.  She can resume her antibiotics as taking at home.  Bert Ptacek 11/30/2013, 5:21 PM  

## 2013-12-01 NOTE — Brief Op Note (Signed)
11/30/2013 - 12/01/2013  7:36 PM  PATIENT:  [name]  62 y.o. female  PRE-OPERATIVE DIAGNOSIS:  left periprosthetic fracture  POST-OPERATIVE DIAGNOSIS: Left peri-prosthetic femur fracture  PROCEDURE:  Procedure(s): OPEN REDUCTION INTERNAL FIXATION (ORIF) LEFT PERIPROSTHETIC FRACTURE OF THE FEMUR (Left)  SURGEON:  Surgeon(s) and Role:    * Loanne Drilling, MD - Primary  PHYSICIAN ASSISTANT:   ASSISTANTS: Avel Peace, PA-C   ANESTHESIA:   general  EBL:  Total I/O In: 350 [Blood:350] Out: 100 [Blood:100]  DRAINS: (Medium) Hemovact drain(s) in the left hip with  Suction Open   LOCAL MEDICATIONS USED:  NONE  COUNTS:  YES  TOURNIQUET:  * No tourniquets in log *  DICTATION: .Other Dictation: Dictation Number [id number]  PLAN OF CARE: Admit to inpatient   PATIENT DISPOSITION:  PACU - hemodynamically stable.

## 2013-12-01 NOTE — Transfer of Care (Signed)
Immediate Anesthesia Transfer of Care Note  Patient: [name]  Procedure(s) Performed: Procedure(s): OPEN REDUCTION INTERNAL FIXATION (ORIF) LEFT PERIPROSTHETIC FRACTURE OF THE FEMUR WITH CABLES AND PLATE (Left)  Patient Location: PACU  Anesthesia Type:General  Level of Consciousness: awake, alert , oriented and patient cooperative  Airway & Oxygen Therapy: Patient Spontanous Breathing and Patient connected to nasal cannula oxygen  Post-op Assessment: Report given to PACU RN and Post -op Vital signs reviewed and stable  Post vital signs: stable  Complications: No apparent anesthesia complications

## 2013-12-01 NOTE — Progress Notes (Signed)
CSW met with pt this am to assist with d/c planning. Pt will have surgery today ( hip fx ). Pt states she plans to return home following hospital d/c. CSW offered to search for ST Rehab facilities in case placement was needed following surgery. Pt declined stating she plans to return home. CSW is available to assist with ST Rehab if pt changes her mind. CSW encouraged pt to consider this option.  Cori Razor LCSW [phone]

## 2013-12-01 NOTE — Progress Notes (Addendum)
ANTIBIOTIC CONSULT NOTE - INITIAL  Pharmacy Consult for Vancomycin Indication: Prosthetic Joint Infection  Allergies  Allergen Reactions  . Augmentin [Amoxicillin-Pot Clavulanate] Nausea And Vomiting  . Morphine And Related     In large amounts has caused itching    Patient Measurements: Height: 5' (152.4 cm) Weight: 105 lb (47.628 kg) IBW/kg (Calculated) : 45.5 Adjusted Body Weight:   Vital Signs: Temp: 98.8 F (37.1 C) (12/10 1356) Temp src: Oral (12/10 1356) BP: 108/66 mmHg (12/10 1356) Pulse Rate: 76 (12/10 1356) Intake/Output from previous day: 12/09 0701 - 12/10 0700 In: 360 [I.V.:360] Out: 525 [Urine:525] Intake/Output from this shift: Total I/O In: 1475 [I.V.:800; Blood:675] Out: 400 [Urine:150; Blood:250]  Labs:  Recent Labs  11/29/13 0318 11/30/13 1912 12/01/13 0525  WBC 14.3* 13.3* 9.8  HGB 7.4* 8.2* 7.0*  PLT 468* 569* 595*  CREATININE 0.48* 0.45* 0.46*   Estimated Creatinine Clearance: 51 ml/min (by C-G formula based on Cr of 0.46). No results found for this basename: VANCOTROUGH, Leodis Binet, VANCORANDOM, GENTTROUGH, GENTPEAK, GENTRANDOM, TOBRATROUGH, TOBRAPEAK, TOBRARND, AMIKACINPEAK, AMIKACINTROU, AMIKACIN,  in the last 72 hours   Microbiology: Recent Results (from the past 720 hour(s))  SURGICAL PCR SCREEN     Status: None   Collection Time    11/16/13 12:55 PM      Result Value Range Status   MRSA, PCR NEGATIVE  NEGATIVE Final   Staphylococcus aureus NEGATIVE  NEGATIVE Final   Comment:            The Xpert SA Assay (FDA     approved for NASAL specimens     in patients over 64 years of age),     is one component of     a comprehensive surveillance     program.  Test performance has     been validated by [employer] for patients greater     than or equal to 59 year old.     It is not intended     to diagnose infection nor to     guide or monitor treatment.  GRAM STAIN     Status: None   Collection Time    11/24/13  1:53 PM   Result Value Range Status   Specimen Description HIP LEFT FLUID   Final   Special Requests ANCEF   Final   Gram Stain     Final   Value: MODERATE WBC PRESENT, PREDOMINANTLY PMN     RARE GRAM POSITIVE COCCI IN PAIRS     Gram Stain Report Called to,Read Back By and Verified With: POZIL,T. AT  1441 ON 12.03.14 BY LOVE,T.   Report Status 11/24/2013 FINAL   Final  ANAEROBIC CULTURE     Status: None   Collection Time    11/24/13  1:53 PM      Result Value Range Status   Specimen Description HIP LEFT FLUID   Final   Special Requests ANCEF   Final   Gram Stain     Final   Value: MODERATE WBC PRESENT,BOTH PMN AND MONONUCLEAR     NO ORGANISMS SEEN     Performed at [facility]   Culture     Final   Value: NO ANAEROBES ISOLATED     Performed at [facility]   Report Status 11/29/2013 FINAL   Final  BODY FLUID CULTURE     Status: None   Collection Time    11/24/13  1:53 PM      Result  Value Range Status   Specimen Description HIP LEFT FLUID   Final   Special Requests ANCEF   Final   Gram Stain     Final   Value: MODERATE WBC PRESENT, PREDOMINANTLY PMN     RARE GRAM POSITIVE COCCI IN PAIRS     Performed by [facility] Gram Stain Report Called to,Read Back By and Verified With: Gram Stain Report Called to,Read Back By and Verified With: POZIL T. [id number] ON 11/24/2013 BY LOVE T.     Performed at [facility]     Final   Value: NO GROWTH 3 DAYS     Performed at [facility]   Report Status 11/27/2013 FINAL   Final    Medical History: Past Medical History  Diagnosis Date  . Osteoporosis   . Hiatal hernia   . Depression   . Dislocation of right hip   . Dislocation     pt has had multiple dislocations of both hips   . Arthritis     DDD--HX OF MULTIPLE SPINAL FUSIONS- HAS A LOT OF BACK PAIN; OA -  . GERD (gastroesophageal reflux disease)   . Anxiety   . Electrical shock sensation 1973    DAMAGED BLOOD VESSEL LEFT HAND - VASCULAR  SURGERY WAS DONE  . 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 Balance problems     PT RELATES TO ROD IN RT LEG  . Ringing in ears   . 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 Pt readmitted 12/10 for angulated left femoral shaft fracture, now s/p ORIF.   Pharmacy consulted to resume Vancomycin dosing.  It is unclear at this time the Vancomycin dosing schedule at home (via PICC) 12/8-12/10.  Vancomycin 1g IV given tonight during OR @ 1740.  Will need to check vancomycin trough before ordering further vancomycin.    D#7 Antibiotics 12/4 >>vancomycin >>   Tmax: 98 WBCs: WNL Renal: SCr = 0.46, stable. Est CrCl = 77ml/min, normalized using adj Scr = 0.8mg /dl, CG = 04VW/UJW (low d/t short stature and low total BW)   Microbiology: 12/3: L hip fluid - NGF (Gram stain = rare GPC pairs), no anaerobes   Drug levels/dose changes:  12/7 Vancomycin trough on Vancomycin 750mg  IV q 12 hours = 5, subtherapeutic.   Goal of Therapy:  Vancomycin trough level 15-20 mcg/ml   Plan:  Check vancomycin random level tomorrow with AM labs and schedule maintenance vancomycin doses based off this level.  Monitor renal function, clinical course.    Begin 1gm iv vancomycin q12hr,   Haynes Hoehn, PharmD, BCPS 12/01/2013, 8:50 PM  Pager: [phone]

## 2013-12-01 NOTE — Progress Notes (Signed)
D PERKINS PA paged and returned page.  Notified pt received 1 UNIT PRBC's in OR.  BP 151/81, HR 80's.  CBC ordered for AM.  Orders received, authorized start administration on floor.

## 2013-12-01 NOTE — Anesthesia Preprocedure Evaluation (Addendum)
Anesthesia Evaluation  Patient identified by MRN, date of birth, ID band Patient awake    Reviewed: Allergy & Precautions, H&P , NPO status , Patient's Chart, lab work & pertinent test results  Airway Mallampati: II TM Distance: >3 FB Neck ROM: full    Dental  (+) Caps and Dental Advisory Given Cap right upper front:   Pulmonary Current Smoker,  breath sounds clear to auscultation  Pulmonary exam normal       Cardiovascular Exercise Tolerance: Good negative cardio ROS  Rhythm:regular Rate:Normal     Neuro/Psych Back fusions negative neurological ROS  negative psych ROS   GI/Hepatic negative GI ROS, hiatal hernia, GERD-  Medicated and Controlled,(+) Hepatitis -, AutoimmuneHepatitis 1991.  No problems now   Endo/Other  negative endocrine ROS  Renal/GU negative Renal ROS  negative genitourinary   Musculoskeletal   Abdominal   Peds  Hematology negative hematology ROS (+) anemia , hgb 7   Anesthesia Other Findings K 3.0  Na 136  Reproductive/Obstetrics negative OB ROS                         Anesthesia Physical Anesthesia Plan  ASA: III  Anesthesia Plan: General   Post-op Pain Management:    Induction: Intravenous  Airway Management Planned: Oral ETT  Additional Equipment:   Intra-op Plan:   Post-operative Plan: Extubation in OR  Informed Consent: I have reviewed the patients History and Physical, chart, labs and discussed the procedure including the risks, benefits and alternatives for the proposed anesthesia with the patient or authorized representative who has indicated his/her understanding and acceptance.   Dental Advisory Given  Plan Discussed with: CRNA and Surgeon  Anesthesia Plan Comments:        Anesthesia Quick Evaluation

## 2013-12-02 ENCOUNTER — Encounter (HOSPITAL_COMMUNITY): Payer: Self-pay | Admitting: Orthopedic Surgery

## 2013-12-02 LAB — BASIC METABOLIC PANEL
BUN: 6 mg/dL (ref 6–23)
CO2: 23 mEq/L (ref 19–32)
Calcium: 8 mg/dL — ABNORMAL LOW (ref 8.4–10.5)
Creatinine, Ser: 0.44 mg/dL — ABNORMAL LOW (ref 0.50–1.10)
Glucose, Bld: 82 mg/dL (ref 70–99)
Sodium: 136 mEq/L (ref 135–145)

## 2013-12-02 LAB — TYPE AND SCREEN
ABO/RH(D): O POS
Donor AG Type: NEGATIVE
Donor AG Type: NEGATIVE

## 2013-12-02 LAB — CBC
Hemoglobin: 9.5 g/dL — ABNORMAL LOW (ref 12.0–15.0)
MCH: 27.1 pg (ref 26.0–34.0)
MCHC: 32.5 g/dL (ref 30.0–36.0)
MCV: 83.2 fL (ref 78.0–100.0)
Platelets: 523 10*3/uL — ABNORMAL HIGH (ref 150–400)
RBC: 3.51 MIL/uL — ABNORMAL LOW (ref 3.87–5.11)
RDW: 14.6 % (ref 11.5–15.5)

## 2013-12-02 LAB — VANCOMYCIN, RANDOM: Vancomycin Rm: 8.2 ug/mL

## 2013-12-02 MED ORDER — VANCOMYCIN HCL IN DEXTROSE 1-5 GM/200ML-% IV SOLN
1000.0000 mg | Freq: Two times a day (BID) | INTRAVENOUS | Status: DC
  Administered 2013-12-02: 1000 mg via INTRAVENOUS
  Filled 2013-12-02 (×2): qty 200

## 2013-12-02 MED ORDER — POLYSACCHARIDE IRON COMPLEX 150 MG PO CAPS
150.0000 mg | ORAL_CAPSULE | Freq: Every day | ORAL | Status: DC
  Administered 2013-12-02: 150 mg via ORAL
  Filled 2013-12-02 (×2): qty 1

## 2013-12-02 MED ORDER — VANCOMYCIN HCL 10 G IV SOLR
1250.0000 mg | Freq: Two times a day (BID) | INTRAVENOUS | Status: DC
  Administered 2013-12-02 – 2013-12-03 (×2): 1250 mg via INTRAVENOUS
  Filled 2013-12-02 (×3): qty 1250

## 2013-12-02 MED ORDER — POTASSIUM CHLORIDE CRYS ER 20 MEQ PO TBCR
40.0000 meq | EXTENDED_RELEASE_TABLET | Freq: Every day | ORAL | Status: DC
  Administered 2013-12-02 – 2013-12-03 (×2): 40 meq via ORAL
  Filled 2013-12-02 (×2): qty 2

## 2013-12-02 MED ORDER — SODIUM CHLORIDE 0.9 % IJ SOLN
10.0000 mL | INTRAMUSCULAR | Status: DC | PRN
  Administered 2013-12-03: 10 mL

## 2013-12-02 NOTE — Progress Notes (Signed)
Physical Therapy Treatment Patient Details Name: [name] MRN: [id number] DOB: 1951/07/23 Today's Date: 12/02/2013 Time: 8413-2440 PT Time Calculation (min): 35 min  PT Assessment / Plan / Recommendation  History of Present Illness     PT Comments     Follow Up Recommendations  Home health PT     Does the patient have the potential to tolerate intense rehabilitation     Barriers to Discharge        Equipment Recommendations  None recommended by PT    Recommendations for Other Services    Frequency Min 5X/week   Progress towards PT Goals Progress towards PT goals: Progressing toward goals  Plan Current plan remains appropriate    Precautions / Restrictions Precautions Precautions: Fall;Posterior Hip Restrictions Weight Bearing Restrictions: Yes LLE Weight Bearing: Touchdown weight bearing   Pertinent Vitals/Pain     Mobility  Bed Mobility Bed Mobility: Sit to Supine Supine to Sit: 4: Min assist;3: Mod assist Sit to Supine: 4: Min assist;3: Mod assist Details for Bed Mobility Assistance: cues for sequence, use of R LE to self assist and adherence to THP Transfers Transfers: Sit to Stand;Stand to Sit Sit to Stand: 4: Min assist;From chair/3-in-1 Stand to Sit: 4: Min assist;To chair/3-in-1;To bed Details for Transfer Assistance: cues for hand placement, safety and THP Ambulation/Gait Ambulation/Gait Assistance: 4: Min assist Ambulation Distance (Feet): 58 Feet (and 25) Assistive device: Rolling walker Ambulation/Gait Assistance Details: cues for sequence, posture, position from RW, step-to gait and TDWB Gait Pattern: Step-to pattern;Shuffle Gait velocity: decr General Gait Details: Pt performing NWB on L LE    Exercises Total Joint Exercises Ankle Circles/Pumps: Both;10 reps   PT Diagnosis: Difficulty walking;Acute pain  PT Problem List: Decreased strength;Decreased mobility;Pain;Decreased knowledge of precautions;Decreased knowledge of use of DME PT  Treatment Interventions: DME instruction;Gait training;Patient/family education;Functional mobility training;Therapeutic activities;Therapeutic exercise;Stair training   PT Goals (current goals can now be found in the care plan section) Acute Rehab PT Goals Patient Stated Goal: d/c home tomorrow PT Goal Formulation: With patient Time For Goal Achievement: 12/02/13 Potential to Achieve Goals: Good  Visit Information  Last PT Received On: 12/02/13 Assistance Needed: +2    Subjective Data  Patient Stated Goal: d/c home tomorrow   Cognition  Cognition Arousal/Alertness: Awake/alert Behavior During Therapy: WFL for tasks assessed/performed Overall Cognitive Status: Within Functional Limits for tasks assessed    Balance     End of Session PT - End of Session Equipment Utilized During Treatment: Gait belt;Other (comment) Activity Tolerance: Patient tolerated treatment well Patient left: in bed;with call bell/phone within reach;with family/visitor present Nurse Communication: Mobility status   GP     Maximos Zayas 12/02/2013, 4:34 PM

## 2013-12-02 NOTE — Care Management Note (Addendum)
    Page 1 of 1   12/03/2013     5:32:53 PM   CARE MANAGEMENT NOTE 12/03/2013  Patient:  [name], [name]   Account Number:  [id number]  Date Initiated:  12/02/2013  Documentation initiated by:  Colleen Can  Subjective/Objective Assessment:   dx peri-prosthetic fracture left femoral shaft; ORIF 12/01/13  hx recent discharge indected l total hip with resection and placemnt of ABX spacer     Action/Plan:   Pt is from home and Active with [facility]. Was on home IV abx. PT/OT pending CM will follow   Anticipated DC Date:  12/04/2013   Anticipated DC Plan:  HOME W HOME HEALTH SERVICES  In-house referral  Clinical Social Worker      DC Planning Services  CM consult      [facility] Choice  Resumption Of Svcs/PTA Provider   Choice offered to / List presented to:          [facility] arranged  HH-1 RN  HH-2 PT      [facility] agency  [facility]   Status of service:  Completed, signed off Medicare Important Message given?   (If response is "NO", the following Medicare IM given date fields will be blank) Date Medicare IM given:   Date Additional Medicare IM given:    Discharge Disposition:  HOME W HOME HEALTH SERVICES  Per UR Regulation:  Reviewed for med. necessity/level of care/duration of stay  If discussed at Long Length of Stay Meetings, dates discussed:    Comments:

## 2013-12-02 NOTE — Progress Notes (Signed)
Pt's surgical dressing saturated with yellow fluid. Dressing reinforced and PA notified.
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
## 2013-12-02 NOTE — Op Note (Signed)
Suzanne, [name]                 ACCOUNT NO.:  [id number]  MEDICAL RECORD NO.:  [id number]  LOCATION:  1620                         FACILITY:  [facility]  PHYSICIAN:  Ollen Gross, M.D.    DATE OF BIRTH:  1951/05/14  DATE OF PROCEDURE:  12/01/2013 DATE OF DISCHARGE:                              OPERATIVE REPORT   PREOPERATIVE DIAGNOSIS:  Left periprosthetic femur fracture.  POSTOPERATIVE DIAGNOSIS:  Left periprosthetic femur fracture.  PROCEDURE:  Open reduction and internal fixation, left periprosthetic femur fracture.  SURGEON:  Ollen Gross, M.D.  ASSISTANT:  [facility].  ANESTHESIA:  General.  ESTIMATED BLOOD LOSS:  100 mL.  DRAINS:  Hemovac x1.  COMPLICATIONS:  None.  CONDITION:  Stable to recovery.  BRIEF CLINICAL NOTE:  [name] is a 62 year old female, long complex history in regard to her hips.  She had a resection arthroplasty due to an infected prosthetic hip done last week.  She had an extended trochanteric osteotomy performed to remove the femoral stem.  Two nights ago, she got up in the middle of the night, was confused, walked on the leg and developed pain.  She came to the office yesterday and was noted to have a periprosthetic fracture around the area of the trochanteric osteotomy.  She presents now for open reduction and internal fixation with plate.  PROCEDURE IN DETAIL:  After successful administration of general anesthetic, the patient was placed in the right lateral decubitus position with the left side up and held with the hip positioner.  The left lower extremity is isolated from her perineum with plastic drapes and prepped and draped in usual sterile fashion.  Long lateral incision was made with a 10 blade incorporating most of her previous incision. Skin was cut with 10 blade through subcutaneous tissue to the level of the fascia lata which was incised in line with the skin incision. Sciatic nerve palpated and protected.  I then incised  the fascia of the vastus lateralis and then elevated the muscle of the intermuscular septum.  We did expose the femur.  One of the cables that we had on from last procedure is right in the fracture site, so we removed that cable. We then utilized the Zimmer 6 hole cortical plate and placed it along the lateral cortex of the femur.  I placed a screw through 1 of the holes distal to the fracture with excellent bicortical purchase.  We then placed 3 cables proximal to the fracture circumferentially around the femur, tightened those down, effectively anatomic for reducing the fracture.  Two more cables were then passed distal to the fracture, those were also tightened down.  All the cables were removed.  Femur was moving as a solitary unit and is stable now.  Wound was then copiously irrigated with saline solution and the fascia of the vastus lateralis closed with #1 running V-Loc suture.  Fascia lata was closed over Hemovac drain with a running #1 V-Loc suture.  Subcu was closed with interrupted 2-0 Vicryl and skin closed with staples.  Incisions cleaned and dried and a bulky sterile dressing applied.  She was placed into a knee immobilizer, awakened, and transported to recovery in  a stable condition.  Note that a surgical assistant was a medical necessity for this procedure to perform it in a safe and expeditious manner.  Surgical assistance necessary for retraction of vital neurovascular structures and for appropriate reduction and maintenance of this fracture reduction while the plate is being applied.     Ollen Gross, M.D.     FA/MEDQ  D:  12/01/2013  T:  12/02/2013  Job:  [id number]

## 2013-12-02 NOTE — Progress Notes (Signed)
Pharmacy Consult Note - Vancomycin  A/P: per previous admission a few days ago, vancomycin trough level was 5 on 750mg  q12 and intended dose increase at the time was 1750mg  x 1 dose then 1250mg  IV q12 thereafter. Per discharge summary on 12/8, vancomycin 1250mg  IV q12 was ordered but we are still unsure if this was actually given outpatient or what. So, with patient's labs being stable and similar to previous admission, I will change vancomycin to previously intended 1250mg  IV q12 dosing from 1g q12 that was started earlier today. Will recheck trough prior to 4th dose. Today will be Day #8 total of vancomycin of intended 6 weeks per previous stay.  Hessie Knows, PharmD, BCPS Pager [phone] 12/02/2013 12:43 PM

## 2013-12-02 NOTE — Evaluation (Signed)
Physical Therapy Evaluation Patient Details Name: [name] MRN: [id number] DOB: Mar 05, 1951 Today's Date: 12/02/2013 Time: 4098-1191 PT Time Calculation (min): 30 min  PT Assessment / Plan / Recommendation History of Present Illness     Clinical Impression  Pt s/p L IT femur fx with ORIF and recent L hip resection with spacer presents with functional mobility limitations 2* TDWB, post THP and decreased L LE strength/ROM.  Pt should progress to d/c home with family assist and HHPT follow up    PT Assessment  Patient needs continued PT services    Follow Up Recommendations  Home health PT    Does the patient have the potential to tolerate intense rehabilitation      Barriers to Discharge        Equipment Recommendations  None recommended by PT    Recommendations for Other Services     Frequency Min 5X/week    Precautions / Restrictions Precautions Precautions: Fall;Posterior Hip Restrictions Weight Bearing Restrictions: Yes LLE Weight Bearing: Touchdown weight bearing   Pertinent Vitals/Pain 5/10, premed     Mobility  Bed Mobility Bed Mobility: Supine to Sit Supine to Sit: 4: Min assist;3: Mod assist Details for Bed Mobility Assistance: cues for sequence, use of R LE to self assist and adherence to THP Transfers Transfers: Sit to Stand;Stand to Sit Sit to Stand: 4: Min assist;From bed Stand to Sit: 4: Min assist;To chair/3-in-1;3: Mod assist Details for Transfer Assistance: cues for hand placement, safety and THP Ambulation/Gait Ambulation/Gait Assistance: 4: Min assist Ambulation Distance (Feet): 38 Feet Assistive device: Rolling walker Ambulation/Gait Assistance Details: cues for sequence and TDWB Gait Pattern: Step-to pattern;Shuffle Gait velocity: decr General Gait Details: Pt performing NWB on L LE    Exercises Total Joint Exercises Ankle Circles/Pumps: Both;10 reps   PT Diagnosis: Difficulty walking;Acute pain  PT Problem List: Decreased  strength;Decreased mobility;Pain;Decreased knowledge of precautions;Decreased knowledge of use of DME PT Treatment Interventions: DME instruction;Gait training;Patient/family education;Functional mobility training;Therapeutic activities;Therapeutic exercise;Stair training     PT Goals(Current goals can be found in the care plan section) Acute Rehab PT Goals Patient Stated Goal: d/c home tomorrow PT Goal Formulation: With patient Time For Goal Achievement: 12/02/13 Potential to Achieve Goals: Good  Visit Information  Last PT Received On: 12/02/13 Assistance Needed: +2       Prior Functioning  Home Living Family/patient expects to be discharged to:: Private residence Living Arrangements: Spouse/significant other Available Help at Discharge: Family Type of Home: House Home Access: Stairs to enter Secretary/administrator of Steps: 3 Entrance Stairs-Rails: Right Home Layout: One level Home Equipment: Environmental consultant - 2 wheels;Bedside commode;Crutches;Cane - single point;Wheelchair - manual Prior Function Level of Independence: Needs assistance Communication Communication: No difficulties Dominant Hand: Right    Cognition  Cognition Arousal/Alertness: Awake/alert Behavior During Therapy: WFL for tasks assessed/performed Overall Cognitive Status: Within Functional Limits for tasks assessed    Extremity/Trunk Assessment Upper Extremity Assessment Upper Extremity Assessment: Overall WFL for tasks assessed Lower Extremity Assessment Lower Extremity Assessment: LLE deficits/detail LLE Deficits / Details: abd splint placed and KI removed  LLE: Unable to fully assess due to immobilization   Balance    End of Session PT - End of Session Equipment Utilized During Treatment: Gait belt;Other (comment) (abd splint) Activity Tolerance: Patient tolerated treatment well Patient left: in chair;with call bell/phone within reach Nurse Communication: Mobility status  GP      [name] 12/02/2013, 2:10 PM

## 2013-12-03 LAB — CBC
HCT: 31.7 % — ABNORMAL LOW (ref 36.0–46.0)
MCH: 26.8 pg (ref 26.0–34.0)
MCHC: 32.2 g/dL (ref 30.0–36.0)
MCV: 83.4 fL (ref 78.0–100.0)
RDW: 15.1 % (ref 11.5–15.5)

## 2013-12-03 LAB — BASIC METABOLIC PANEL
BUN: 5 mg/dL — ABNORMAL LOW (ref 6–23)
Calcium: 8.4 mg/dL (ref 8.4–10.5)
Creatinine, Ser: 0.46 mg/dL — ABNORMAL LOW (ref 0.50–1.10)
GFR calc Af Amer: >90 mL/min (ref >90)
GFR calc non Af Amer: >90 mL/min (ref >90)

## 2013-12-03 MED ORDER — HEPARIN SOD (PORK) LOCK FLUSH 100 UNIT/ML IV SOLN
500.0000 [IU] | INTRAVENOUS | Status: AC | PRN
  Administered 2013-12-03: 500 [IU]

## 2013-12-03 MED ORDER — VANCOMYCIN HCL 10 G IV SOLR: 1250.0000 mg | Freq: Two times a day (BID) | INTRAVENOUS | Status: DC

## 2013-12-03 NOTE — Evaluation (Signed)
Occupational Therapy Evaluation Patient Details Name: [name] MRN: [id number] DOB: [birth year] Today's Date: 12/03/2013 Time: 5621-3086 OT Time Calculation (min): 15 min  OT Assessment / Plan / Recommendation History of present illness     Clinical Impression   Pt was admitted for L periprosthetic fx which was managed via ORIF.  She was recently here for L TH resection with spacer.  Pt is TDWB and has THPs.  Reviewed hip abductor brace application with pt/husband.  Recommend HHOT reinforce this.      OT Assessment  All further OT needs can be met in the next venue of care    Follow Up Recommendations  Home health OT    Barriers to Discharge      Equipment Recommendations  None recommended by OT    Recommendations for Other Services    Frequency  Min 2X/week    Precautions / Restrictions Precautions Precautions: Fall;Posterior Hip Restrictions LLE Weight Bearing: Touchdown weight bearing   Pertinent Vitals/Pain Pt did not c/o pain but did c/o fatique after OT.  Repositioned in bed    ADL  Upper Body Dressing: Min guard Where Assessed - Upper Body Dressing: Supported standing Lower Body Dressing: Maximal assistance Where Assessed - Lower Body Dressing: Supported sit to stand Toilet Transfer: Min guard;Simulated (bed) Toilet Transfer Method: Sit to stand Equipment Used: Rolling walker Transfers/Ambulation Related to ADLs: sit to stand only with min guard.  Pt is able to maintain TDWB ADL Comments: Educated pt/husband about donning/doffing hip abductor brace for shower.  Pt has a roll in shower without lip.  Demonstrated this at eob and husband assisted with straps.  Helped pt get clothes on also.  Asked pt to sit for camisole, but she stated standing was more comfortable--adjusted waist part of hip abductor brace over this.  She did sit for pants to be placed over.  Husband verbalizes understanding of brace They had no further questions about brace or THPS   OT  Diagnosis: Generalized weakness  OT Problem List:   OT Treatment Interventions: Self-care/ADL training;DME and/or AE instruction;Patient/family education   OT Goals(Current goals can be found in the care plan section)    Visit Information  Last OT Received On: 12/03/13 Assistance Needed: +2       Prior Functioning     Home Living Family/patient expects to be discharged to:: Private residence Living Arrangements: Spouse/significant other Available Help at Discharge: Family Home Equipment: Dan Humphreys - 2 wheels;Bedside commode;Crutches;Cane - single point;Wheelchair - manual Prior Function Level of Independence: Needs assistance Communication Communication: No difficulties Dominant Hand: Right         Vision/Perception     Cognition  Cognition Arousal/Alertness: Awake/alert Behavior During Therapy: WFL for tasks assessed/performed Overall Cognitive Status: Within Functional Limits for tasks assessed    Extremity/Trunk Assessment Upper Extremity Assessment Upper Extremity Assessment: Overall WFL for tasks assessed     Mobility Bed Mobility Supine to Sit: 4: Min assist Sit to Supine: 4: Min assist Details for Bed Mobility Assistance: assist with RLE Transfers Sit to Stand: 4: Min guard;From bed;From elevated surface Stand to Sit: 4: Min guard;To bed Details for Transfer Assistance: cues for RLE     Exercise     Balance     End of Session OT - End of Session Activity Tolerance: Patient tolerated treatment well Patient left: in bed;with call bell/phone within reach;with family/visitor present  GO     Lavere Stork 12/03/2013, 11:58 AM Marica Otter, OTR/L [phone] 12/03/2013

## 2013-12-03 NOTE — Progress Notes (Signed)
Pt to d/c home with [facility]. AVS reviewed and "My Chart" discussed with pt. Pt capable of verbalizing medications and follow-up appointments. Remains hemodynamically stable. No signs and symptoms of distress. Educated pt to return to ER in the case of SOB, dizziness, or chest pain.

## 2013-12-03 NOTE — Progress Notes (Signed)
Physical Therapy Treatment Patient Details Name: [name] MRN: [id number] DOB: Sep 08, 1951 Today's Date: 12/03/2013 Time: 1191-4782 PT Time Calculation (min): 27 min  PT Assessment / Plan / Recommendation  History of Present Illness     PT Comments     Follow Up Recommendations  Home health PT     Does the patient have the potential to tolerate intense rehabilitation     Barriers to Discharge        Equipment Recommendations  None recommended by PT    Recommendations for Other Services    Frequency Min 5X/week   Progress towards PT Goals Progress towards PT goals: Progressing toward goals  Plan Current plan remains appropriate    Precautions / Restrictions Precautions Precautions: Fall;Posterior Hip Restrictions Weight Bearing Restrictions: Yes LLE Weight Bearing: Touchdown weight bearing   Pertinent Vitals/Pain     Mobility  Bed Mobility Bed Mobility: Supine to Sit Supine to Sit: 4: Min assist Sit to Supine: 4: Min assist Details for Bed Mobility Assistance: assist with RLE Transfers Transfers: Sit to Stand;Stand to Sit Sit to Stand: 4: Min guard;From bed;From elevated surface;From chair/3-in-1 Stand to Sit: 4: Min guard;To chair/3-in-1 Details for Transfer Assistance: cues for RLE Ambulation/Gait Ambulation/Gait Assistance: 4: Min guard Ambulation Distance (Feet): 25 Feet (twice) Assistive device: Rolling walker Ambulation/Gait Assistance Details: cues for posture, sequence, position from RW, step to gait and TWB Gait Pattern: Step-to pattern;Shuffle Gait velocity: decr Stairs: Yes Stairs Assistance: 4: Min assist Stairs Assistance Details (indicate cue type and reason): cues for sequence and foot/crutch placement Stair Management Technique: One rail Right;Step to pattern;Forwards;With crutches Number of Stairs: 4    Exercises Total Joint Exercises Ankle Circles/Pumps: Both;10 reps   PT Diagnosis:    PT Problem List:   PT Treatment  Interventions:     PT Goals (current goals can now be found in the care plan section) Acute Rehab PT Goals Patient Stated Goal: d/c home tomorrow PT Goal Formulation: With patient Time For Goal Achievement: 12/02/13 Potential to Achieve Goals: Good  Visit Information  Last PT Received On: 12/03/13 Assistance Needed: +1    Subjective Data  Subjective: I need to use the bathroom Patient Stated Goal: d/c home tomorrow   Cognition  Cognition Arousal/Alertness: Awake/alert Behavior During Therapy: WFL for tasks assessed/performed Overall Cognitive Status: Within Functional Limits for tasks assessed    Balance     End of Session PT - End of Session Equipment Utilized During Treatment: Gait belt;Other (comment) Activity Tolerance: Patient tolerated treatment well Patient left: in chair;with call bell/phone within reach;with family/visitor present Nurse Communication: Mobility status   GP     Deyona Soza 12/03/2013, 12:45 PM

## 2013-12-03 NOTE — Progress Notes (Signed)
Physical Therapy Treatment Patient Details Name: [name] MRN: [id number] DOB: [birth year] Today's Date: 12/03/2013 Time: 0940-1006 PT Time Calculation (min): 26 min  PT Assessment / Plan / Recommendation  History of Present Illness     PT Comments     Follow Up Recommendations  Home health PT     Does the patient have the potential to tolerate intense rehabilitation     Barriers to Discharge        Equipment Recommendations  None recommended by PT    Recommendations for Other Services    Frequency Min 5X/week   Progress towards PT Goals Progress towards PT goals: Progressing toward goals  Plan Current plan remains appropriate    Precautions / Restrictions Precautions Precautions: Fall;Posterior Hip Restrictions Weight Bearing Restrictions: Yes LLE Weight Bearing: Touchdown weight bearing   Pertinent Vitals/Pain 5/0; premed, ice packs provided    Mobility  Bed Mobility Bed Mobility: Supine to Sit;Sit to Supine Supine to Sit: 4: Min assist Sit to Supine: 4: Min assist Details for Bed Mobility Assistance: assist with RLE Transfers Transfers: Sit to Stand;Stand to Sit Sit to Stand: 4: Min guard;From bed;From elevated surface Stand to Sit: 4: Min guard;To bed Details for Transfer Assistance: cues for RLE Ambulation/Gait Ambulation/Gait Assistance: 4: Min guard Ambulation Distance (Feet): 74 Feet (and 25') Assistive device: Rolling walker Ambulation/Gait Assistance Details: cues for posture, position from RW, sequence, step to gait and TDWB Gait Pattern: Step-to pattern;Shuffle Gait velocity: decr    Exercises Total Joint Exercises Ankle Circles/Pumps: Both;10 reps   PT Diagnosis:    PT Problem List:   PT Treatment Interventions:     PT Goals (current goals can now be found in the care plan section) Acute Rehab PT Goals Patient Stated Goal: d/c home tomorrow PT Goal Formulation: With patient Time For Goal Achievement: 12/02/13 Potential to Achieve  Goals: Good  Visit Information  Last PT Received On: 12/03/13 Assistance Needed: +2    Subjective Data  Subjective: I need to use the bathroom Patient Stated Goal: d/c home tomorrow   Cognition  Cognition Arousal/Alertness: Awake/alert Behavior During Therapy: WFL for tasks assessed/performed Overall Cognitive Status: Within Functional Limits for tasks assessed    Balance     End of Session PT - End of Session Equipment Utilized During Treatment: Gait belt;Other (comment) Activity Tolerance: Patient tolerated treatment well Patient left: in bed;with call bell/phone within reach;with family/visitor present Nurse Communication: Mobility status   GP     [name] 12/03/2013, 12:41 PM

## 2013-12-03 NOTE — Discharge Summary (Signed)
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 day two and the incision was healing okay. Patient was seen in rounds by Dr. Lequita Halt and was ready to go home later on day two.   Discharge Medications: Prior to Admission medications   Medication Sig Start Date End Date Taking? Authorizing Provider  ALPRAZolam Prudy Feeler) 1 MG tablet Take 2 mg by mouth at bedtime.    Yes Historical Provider, MD  FLUoxetine (PROZAC) 20 MG capsule Take 40 mg by mouth every morning.    Yes Historical Provider, MD  gabapentin (NEURONTIN) 800 MG tablet Take 800 mg by mouth 4 (four) times daily.    Yes Historical Provider, MD  methocarbamol (ROBAXIN) 500 MG tablet Take 1 tablet (500 mg total) by mouth every 6 (six) hours as needed for muscle spasms. 11/29/13  Yes Alexzandrew Julien Girt, PA-C  oxyCODONE (OXY IR/ROXICODONE) 5 MG immediate release tablet Take 1-4 tablets (5-20 mg total) by mouth  every 3 (three) hours as needed for breakthrough pain. 11/29/13  Yes Alexzandrew Julien Girt, PA-C  rivaroxaban (XARELTO) 10 MG TABS tablet Take 1 tablet (10 mg total) by mouth daily with breakfast. Take Xarelto for two and a half more weeks, then discontinue Xarelto. Once the patient has completed the blood thinner regimen, then take a Baby 81 mg Aspirin daily for four more weeks. 11/29/13  Yes Alexzandrew Julien Girt, PA-C  sodium chloride 0.9 % SOLN 250 mL with vancomycin 10 G SOLR 1,250 mg Inject 1,250 mg into the vein every 12 (twelve) hours. Patient already has RX and medications at home to continue the previously prescribed 6 week protocol of IV antibiotics. 12/03/13   Alexzandrew Julien Girt, PA-C   Discharge home with home health  Diet - Regular diet  Follow up - in 2 weeks  Activity - Touch Down Weight Bearing Only to Left Leg No bending hip over 90 degrees- A "L" Angle Do not cross legs Do not let foot roll inward When turning these patients a pillow should be placed between the patient's legs to prevent crossing. Patients should have the affected knee fully extended when trying to sit or stand from all surfaces to prevent excessive hip flexion. When ambulating and turning toward the affected side the affected leg should have the toes turned out prior to moving the walker and the rest of patient's body as to prevent internal rotation/ turning in of the leg. Abduction pillows are the most effective way to prevent a patient from not crossing legs or turning toes in at rest. If an abduction pillow is not ordered placing a regular pillow length wise between the patient's legs is also an effective reminder. It is imperative that these precautions be maintained so that the surgical hip does not dislocate. Disposition - Home  Condition Upon Discharge - Stable  D/C Meds - See DC Summary  DVT Prophylaxis - Xarelto  Hip Precautions and Hip Abduction Brace  Continue Vancomycin IV via PICC Line  Original  Resection was performed on 11/25/2103 - 6 Weeks of Vanc therapy thru January 14th.        Discharge Orders   Future Appointments Provider Department Dept Phone   01/03/2014 2:00 PM Cliffton Asters, MD [facility] [phone]   Future Orders Complete By Expires   Call MD / Call 911  As directed    Comments:     If you experience chest pain or shortness of breath, CALL 911 and be transported to the hospital emergency room.  If you develope a fever above 101 F, pus (white drainage) or increased drainage or redness at the wound, or calf pain, call your surgeon's office.   Change dressing  As directed    Comments:     You may change your dressing dressing daily with sterile 4 x 4 inch gauze dressing and paper tape.  Do not submerge the incision under water.   Constipation Prevention  As directed    Comments:     Drink plenty of fluids.  Prune juice may be helpful.  You may use a stool softener, such as Colace (over the counter) 100 mg twice a day.  Use MiraLax (over the counter) for constipation as needed.   Diet - low sodium heart healthy  As directed    Discharge instructions  As directed    Comments:     Pick up stool softner and laxative for home. Do not submerge incision under water. May shower. Continue to use ice for pain and swelling from surgery. Hip precautions.  Total Hip Protocol.  Take Xarelto for two and a half more weeks, then discontinue Xarelto. Once the patient has completed the blood thinner regimen, then take a Baby 81 mg Aspirin daily for four more weeks.   Do not sit on low chairs, stoools or toilet seats, as it may be difficult to get up from low surfaces  As directed    Driving restrictions  As directed    Comments:     No driving until released by the physician.   Follow the hip precautions as taught in Physical Therapy  As directed    Increase activity slowly as tolerated  As directed    Scheduling Instructions:     Hip  Abduction Brace At All Times   Lifting restrictions  As directed    Comments:     No lifting until released by the physician.   Patient may shower  As directed    Comments:     You may shower without a dressing once there is no drainage.  Do not wash over the wound.  If drainage remains, do not shower until drainage stops.   TED hose  As directed    Comments:     Use stockings (TED hose) for 3 weeks on both leg(s).  You may remove them at night for sleeping.   Touch down weight bearing  As directed    Questions:     Laterality:     Extremity:         Medication List    STOP taking these medications       omeprazole 20 MG capsule  Commonly known as:  PRILOSEC      TAKE these medications       ALPRAZolam 1 MG tablet  Commonly known as:  XANAX  Take 2 mg by mouth at bedtime.     FLUoxetine 20 MG capsule  Commonly known as:  PROZAC  Take 40  mg by mouth every morning.     gabapentin 800 MG tablet  Commonly known as:  NEURONTIN  Take 800 mg by mouth 4 (four) times daily.     methocarbamol 500 MG tablet  Commonly known as:  ROBAXIN  Take 1 tablet (500 mg total) by mouth every 6 (six) hours as needed for muscle spasms.     oxyCODONE 5 MG immediate release tablet  Commonly known as:  Oxy IR/ROXICODONE  Take 1-4 tablets (5-20 mg total) by mouth every 3 (three) hours as needed for breakthrough pain.     rivaroxaban 10 MG Tabs tablet  Commonly known as:  XARELTO  - Take 1 tablet (10 mg total) by mouth daily with breakfast. Take Xarelto for two and a half more weeks, then discontinue Xarelto.  - Once the patient has completed the blood thinner regimen, then take a Baby 81 mg Aspirin daily for four more weeks.     sodium chloride 0.9 % SOLN 250 mL with vancomycin 10 G SOLR 1,250 mg  Inject 1,250 mg into the vein every 12 (twelve) hours. Patient already has RX and medications at home to continue the previously prescribed 6 week protocol of IV antibiotics.       Follow-up  Information   Follow up with Loanne Drilling, MD. Schedule an appointment as soon as possible for a visit on 12/14/2013.   Specialty:  Orthopedic Surgery   Contact information:   [address] [place] [address] [phone]       Signed: Patrica Duel 12/07/2013, 5:31 PM
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
## 2013-12-03 NOTE — Progress Notes (Signed)
[facility]  Patient Status: Active (receiving services up to time of hospitalization)  [facility] is providing the following services: RN, PT and Home Infusion Services (teaching and education will be done by nurse in the home with patient and caregiver)    Will need resumption of care orders for services please.    If patient discharges after hours, please call [phone].   Lanae Crumbly 12/03/2013, 9:27 AM

## 2013-12-06 ENCOUNTER — Telehealth: Payer: Self-pay | Admitting: *Deleted

## 2013-12-06 ENCOUNTER — Encounter (HOSPITAL_COMMUNITY): Payer: Self-pay | Admitting: Orthopedic Surgery

## 2013-12-06 NOTE — Telephone Encounter (Signed)
Patient scheduled appt for 01/03/14. Wendall Mola CMA

## 2013-12-06 NOTE — Telephone Encounter (Signed)
She is tolerating her IV vancomycin well. She is now completed 12 days of therapy. Even though her operative cultures were negative I told her that I would further recommend completing at least 6 weeks of therapy for presumed left prosthetic hip infection. She is in agreement. I would like to reschedule her followup visit in about 4 weeks.

## 2013-12-06 NOTE — Telephone Encounter (Signed)
Patient called to cancel her upcoming hospital follow up for 12/09/13. Since getting out of the hospital she has fallen and broken her femur, which is why she had to cancel. She would still like to speak to Dr. Orvan Falconer and is requesting that he call her at [phone]. [name]

## 2013-12-09 ENCOUNTER — Ambulatory Visit: Payer: 59 | Admitting: Internal Medicine

## 2013-12-20 ENCOUNTER — Telehealth: Payer: Self-pay | Admitting: *Deleted

## 2013-12-20 NOTE — Telephone Encounter (Signed)
ok 

## 2013-12-20 NOTE — Telephone Encounter (Signed)
Larita Fife, pharmacist at [facility], called to notify MD that patient's labs from 12/28 showed K+ = 6.1.  Per pharmacist, patient denies s/s of hyperkalemia, knows to go to ED if she develops them.  Patient has been taking OTC potassium supplements, Larita Fife advised her to stop them.  Patient will have labs redrawn 12/30. Andree Coss, RN

## 2014-01-03 ENCOUNTER — Inpatient Hospital Stay: Payer: BC Managed Care – PPO | Admitting: Internal Medicine

## 2014-01-03 ENCOUNTER — Telehealth: Payer: Self-pay | Admitting: *Deleted

## 2014-01-03 NOTE — Telephone Encounter (Signed)
[facility]Lynn[facility], pt's vancomycin dose was adjusted last week to 1250 mg BID.  Vanc trough 01/02/14 = 18.1.  Creatinine 0.48 and BUN 33.

## 2014-01-05 ENCOUNTER — Telehealth: Payer: Self-pay | Admitting: *Deleted

## 2014-01-05 NOTE — Telephone Encounter (Signed)
Pt stated she saw Dr. Lequita HaltAluisio 01/04/14 and his office drew lab work.  Last day that pt understands she needs IV ABX is 01/06/14.  RN will call Dr. Deri FuellingAluisio's office for office notes and lab work.

## 2014-01-11 ENCOUNTER — Other Ambulatory Visit: Payer: Self-pay | Admitting: Orthopedic Surgery

## 2014-01-19 ENCOUNTER — Encounter (HOSPITAL_COMMUNITY): Payer: Self-pay | Admitting: Pharmacy Technician

## 2014-01-21 ENCOUNTER — Ambulatory Visit (HOSPITAL_COMMUNITY)
Admission: RE | Admit: 2014-01-21 | Discharge: 2014-01-21 | Disposition: A | Discharge status: Home / Self Care | Payer: BC Managed Care – PPO | Source: Ambulatory Visit | Attending: Orthopedic Surgery | Admitting: Orthopedic Surgery

## 2014-01-21 ENCOUNTER — Encounter (HOSPITAL_COMMUNITY): Payer: Self-pay

## 2014-01-21 ENCOUNTER — Encounter (HOSPITAL_COMMUNITY)
Admission: RE | Admit: 2014-01-21 | Discharge: 2014-01-21 | Disposition: A | Discharge status: Home / Self Care | Payer: BC Managed Care – PPO | Source: Ambulatory Visit | Attending: Orthopedic Surgery | Admitting: Orthopedic Surgery

## 2014-01-21 DIAGNOSIS — Z01812 Encounter for preprocedural laboratory examination: Secondary | ICD-10-CM | POA: Insufficient documentation

## 2014-01-21 DIAGNOSIS — Z96649 Presence of unspecified artificial hip joint: Secondary | ICD-10-CM | POA: Insufficient documentation

## 2014-01-21 DIAGNOSIS — Z01818 Encounter for other preprocedural examination: Secondary | ICD-10-CM | POA: Insufficient documentation

## 2014-01-21 LAB — COMPREHENSIVE METABOLIC PANEL
ALT: 20 U/L (ref 0–35)
AST: 26 U/L (ref 0–37)
Albumin: 3.4 g/dL — ABNORMAL LOW (ref 3.5–5.2)
Alkaline Phosphatase: 185 U/L — ABNORMAL HIGH (ref 39–117)
BUN: 21 mg/dL (ref 6–23)
CO2: 23 mEq/L (ref 19–32)
Calcium: 8.7 mg/dL (ref 8.4–10.5)
Chloride: 100 mEq/L (ref 96–112)
Creatinine, Ser: 0.52 mg/dL (ref 0.50–1.10)
GFR calc Af Amer: >90 mL/min (ref >90)
GFR calc non Af Amer: >90 mL/min (ref >90)
Glucose, Bld: 84 mg/dL (ref 70–99)
Potassium: 4.5 mEq/L (ref 3.7–5.3)
Sodium: 135 mEq/L — ABNORMAL LOW (ref 137–147)
Total Bilirubin: 0.3 mg/dL (ref 0.3–1.2)
Total Protein: 7 g/dL (ref 6.0–8.3)

## 2014-01-21 LAB — URINALYSIS, ROUTINE W REFLEX MICROSCOPIC
BILIRUBIN URINE: NEGATIVE
Glucose, UA: NEGATIVE mg/dL
HGB URINE DIPSTICK: NEGATIVE
Ketones, ur: NEGATIVE mg/dL
Nitrite: NEGATIVE
PH: 6.5 (ref 5.0–8.0)
Protein, ur: NEGATIVE mg/dL
SPECIFIC GRAVITY, URINE: 1.022 (ref 1.005–1.030)
Urobilinogen, UA: 0.2 mg/dL (ref 0.0–1.0)

## 2014-01-21 LAB — CBC
HCT: 40.4 % (ref 36.0–46.0)
Hemoglobin: 12.5 g/dL (ref 12.0–15.0)
MCH: 26.1 pg (ref 26.0–34.0)
MCHC: 30.9 g/dL (ref 30.0–36.0)
MCV: 84.3 fL (ref 78.0–100.0)
PLATELETS: 410 10*3/uL — AB (ref 150–400)
RBC: 4.79 MIL/uL (ref 3.87–5.11)
RDW: 16.5 % — ABNORMAL HIGH (ref 11.5–15.5)
WBC: 9.6 10*3/uL (ref 4.0–10.5)

## 2014-01-21 LAB — SURGICAL PCR SCREEN
MRSA, PCR: NEGATIVE
Staphylococcus aureus: NEGATIVE

## 2014-01-21 LAB — PROTIME-INR
INR: 1 (ref 0.00–1.49)
Prothrombin Time: 13 seconds (ref 11.6–15.2)

## 2014-01-21 LAB — URINE MICROSCOPIC-ADD ON

## 2014-01-21 LAB — APTT: aPTT: 32 seconds (ref 24–37)

## 2014-01-21 MED ORDER — CHLORHEXIDINE GLUCONATE 4 % EX LIQD
60.0000 mL | Freq: Once | CUTANEOUS | Status: DC
  Filled 2014-01-21: qty 60

## 2014-01-21 NOTE — Patient Instructions (Signed)
Pershing CoxSusan C Hayton  01/21/2014   Your procedure is scheduled on: Jan 28 2014  [facility] 2:15[facility]   Call this number if you have problems the morning of surgery: [phone]   Due to [facility] flu policy no visitors under age 65 at this time   Remember:   Do not eat food or drink liquids after midnight.   Take these medicines the morning of surgery with A SIP OF WATER:   Do not wear jewelry, make-up or nail polish.  Do not wear lotions, powders, or perfumes. You may wear deodorant.  Do not shave 48  hours prior to surgery. Men may shave face and neck.  Do not bring valuables to the hospital.  [facility] is not responsible for any belongings or valuables.                 Contacts, dentures or bridgework may not be worn into surgery.  Leave suitcase in the car. After surgery it may be brought to your room.  For patients admitted to the hospital, checkout time is 11:00 AM the day of discharge  .   Patients discharged the day of surgery will not be allowed to drive  home.  Name and phone number of your driver:   Special Instructions:   Shower using CHG 2 nights before surgery and the night before surgery.  If you shower the day of surgery use CHG.  Use special wash - you have one bottle of CHG for all showers.  You should use approximately 1/3 of the bottle for each shower.   Please read over the following fact sheets that you were given: MRSA Information       Questions please call  Johnette AbrahamLaurie Kayelyn Lemon RN     [phone]    Patient signature _______________________________________  Nurse signature ________________________________________

## 2014-01-26 ENCOUNTER — Other Ambulatory Visit: Payer: Self-pay | Admitting: Surgical
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
## 2014-01-27 ENCOUNTER — Other Ambulatory Visit: Payer: Self-pay | Admitting: Orthopedic Surgery

## 2014-01-27 NOTE — Progress Notes (Signed)
Surgery time changed for tomorrow. Patient called and informed to come in at 1215 for surgery at 1445.

## 2014-01-28 ENCOUNTER — Encounter (HOSPITAL_COMMUNITY)
Admission: RE | Disposition: A | Discharge status: Home Health | Payer: Self-pay | Source: Home / Self Care | Attending: Orthopedic Surgery

## 2014-01-28 ENCOUNTER — Inpatient Hospital Stay (HOSPITAL_COMMUNITY)
Admission: RE | Admit: 2014-01-28 | Discharge: 2014-01-31 | DRG: 468 | Disposition: A | Discharge status: Home Health | Payer: BC Managed Care – PPO | Attending: Orthopedic Surgery | Admitting: Orthopedic Surgery

## 2014-01-28 ENCOUNTER — Ambulatory Visit (HOSPITAL_COMMUNITY): Payer: BC Managed Care – PPO | Admitting: Anesthesiology

## 2014-01-28 ENCOUNTER — Encounter (HOSPITAL_COMMUNITY): Payer: Self-pay | Admitting: *Deleted

## 2014-01-28 ENCOUNTER — Encounter (HOSPITAL_COMMUNITY): Payer: BC Managed Care – PPO | Admitting: Anesthesiology

## 2014-01-28 ENCOUNTER — Inpatient Hospital Stay (HOSPITAL_COMMUNITY): Payer: BC Managed Care – PPO

## 2014-01-28 DIAGNOSIS — F3289 Other specified depressive episodes: Secondary | ICD-10-CM | POA: Diagnosis present

## 2014-01-28 DIAGNOSIS — M810 Age-related osteoporosis without current pathological fracture: Secondary | ICD-10-CM | POA: Diagnosis present

## 2014-01-28 DIAGNOSIS — Z8614 Personal history of Methicillin resistant Staphylococcus aureus infection: Secondary | ICD-10-CM

## 2014-01-28 DIAGNOSIS — T84018A Broken internal joint prosthesis, other site, initial encounter: Secondary | ICD-10-CM

## 2014-01-28 DIAGNOSIS — F329 Major depressive disorder, single episode, unspecified: Secondary | ICD-10-CM | POA: Diagnosis present

## 2014-01-28 DIAGNOSIS — M009 Pyogenic arthritis, unspecified: Secondary | ICD-10-CM | POA: Diagnosis present

## 2014-01-28 DIAGNOSIS — K219 Gastro-esophageal reflux disease without esophagitis: Secondary | ICD-10-CM | POA: Diagnosis present

## 2014-01-28 DIAGNOSIS — M1610 Unilateral primary osteoarthritis, unspecified hip: Secondary | ICD-10-CM | POA: Diagnosis present

## 2014-01-28 DIAGNOSIS — Z89629 Acquired absence of unspecified hip joint: Secondary | ICD-10-CM

## 2014-01-28 DIAGNOSIS — Z4789 Encounter for other orthopedic aftercare: Principal | ICD-10-CM

## 2014-01-28 DIAGNOSIS — M169 Osteoarthritis of hip, unspecified: Secondary | ICD-10-CM | POA: Diagnosis present

## 2014-01-28 DIAGNOSIS — Z96649 Presence of unspecified artificial hip joint: Secondary | ICD-10-CM

## 2014-01-28 DIAGNOSIS — S7292XA Unspecified fracture of left femur, initial encounter for closed fracture: Secondary | ICD-10-CM

## 2014-01-28 DIAGNOSIS — D62 Acute posthemorrhagic anemia: Secondary | ICD-10-CM

## 2014-01-28 DIAGNOSIS — F411 Generalized anxiety disorder: Secondary | ICD-10-CM | POA: Diagnosis present

## 2014-01-28 DIAGNOSIS — F17200 Nicotine dependence, unspecified, uncomplicated: Secondary | ICD-10-CM | POA: Diagnosis present

## 2014-01-28 HISTORY — PX: REIMPLANTATION OF TOTAL HIP: SHX6051

## 2014-01-28 LAB — PREPARE RBC (CROSSMATCH)

## 2014-01-28 SURGERY — REIMPLANTATION OF TOTAL HIP
Anesthesia: General | Site: Hip | Laterality: Left

## 2014-01-28 MED ORDER — PHENOL 1.4 % MT LIQD: 1.0000 | OROMUCOSAL | Status: DC | PRN

## 2014-01-28 MED ORDER — MENTHOL 3 MG MT LOZG: 1.0000 | LOZENGE | OROMUCOSAL | Status: DC | PRN

## 2014-01-28 MED ORDER — VANCOMYCIN HCL IN DEXTROSE 1-5 GM/200ML-% IV SOLN
INTRAVENOUS | Status: AC
  Filled 2014-01-28: qty 200

## 2014-01-28 MED ORDER — METHOCARBAMOL 500 MG PO TABS: 500.0000 mg | ORAL_TABLET | Freq: Four times a day (QID) | ORAL | Status: DC | PRN

## 2014-01-28 MED ORDER — BISACODYL 10 MG RE SUPP: 10.0000 mg | Freq: Every day | RECTAL | Status: DC | PRN

## 2014-01-28 MED ORDER — 0.9 % SODIUM CHLORIDE (POUR BTL) OPTIME
TOPICAL | Status: DC | PRN
  Administered 2014-01-28: 1000 mL

## 2014-01-28 MED ORDER — NEOSTIGMINE METHYLSULFATE 1 MG/ML IJ SOLN
INTRAMUSCULAR | Status: AC
  Filled 2014-01-28: qty 10

## 2014-01-28 MED ORDER — GUAIFENESIN ER 600 MG PO TB12
600.0000 mg | ORAL_TABLET | Freq: Two times a day (BID) | ORAL | Status: DC | PRN
  Filled 2014-01-28: qty 1

## 2014-01-28 MED ORDER — DEXAMETHASONE 6 MG PO TABS
10.0000 mg | ORAL_TABLET | Freq: Every day | ORAL | Status: AC
  Administered 2014-01-29: 10 mg via ORAL
  Filled 2014-01-28: qty 1

## 2014-01-28 MED ORDER — ONDANSETRON HCL 4 MG/2ML IJ SOLN
INTRAMUSCULAR | Status: AC
  Filled 2014-01-28: qty 2

## 2014-01-28 MED ORDER — MIDAZOLAM HCL 5 MG/5ML IJ SOLN
INTRAMUSCULAR | Status: DC | PRN
  Administered 2014-01-28 (×2): 1 mg via INTRAVENOUS

## 2014-01-28 MED ORDER — FENTANYL CITRATE 0.05 MG/ML IJ SOLN
INTRAMUSCULAR | Status: AC
  Filled 2014-01-28: qty 2

## 2014-01-28 MED ORDER — MEPERIDINE HCL 50 MG/ML IJ SOLN: 6.2500 mg | INTRAMUSCULAR | Status: DC | PRN

## 2014-01-28 MED ORDER — GLYCOPYRROLATE 0.2 MG/ML IJ SOLN
INTRAMUSCULAR | Status: DC | PRN
  Administered 2014-01-28: 0.6 mg via INTRAVENOUS

## 2014-01-28 MED ORDER — ACETAMINOPHEN 650 MG RE SUPP: 650.0000 mg | Freq: Four times a day (QID) | RECTAL | Status: DC | PRN

## 2014-01-28 MED ORDER — ONDANSETRON HCL 4 MG/2ML IJ SOLN
INTRAMUSCULAR | Status: DC | PRN
  Administered 2014-01-28: 4 mg via INTRAVENOUS

## 2014-01-28 MED ORDER — EPHEDRINE SULFATE 50 MG/ML IJ SOLN
INTRAMUSCULAR | Status: AC
  Filled 2014-01-28: qty 1

## 2014-01-28 MED ORDER — FLUOXETINE HCL 20 MG PO CAPS
40.0000 mg | ORAL_CAPSULE | Freq: Every day | ORAL | Status: DC
  Administered 2014-01-29 – 2014-01-31 (×3): 40 mg via ORAL
  Filled 2014-01-28 (×3): qty 2

## 2014-01-28 MED ORDER — OXYCODONE HCL 5 MG PO TABS
5.0000 mg | ORAL_TABLET | ORAL | Status: DC | PRN
  Administered 2014-01-28 – 2014-01-30 (×10): 20 mg via ORAL
  Administered 2014-01-30: 10 mg via ORAL
  Administered 2014-01-30: 15 mg via ORAL
  Administered 2014-01-31 (×3): 20 mg via ORAL
  Filled 2014-01-28 (×10): qty 4
  Filled 2014-01-28: qty 2
  Filled 2014-01-28: qty 3
  Filled 2014-01-28 (×3): qty 4

## 2014-01-28 MED ORDER — DEXAMETHASONE SODIUM PHOSPHATE 10 MG/ML IJ SOLN
10.0000 mg | Freq: Every day | INTRAMUSCULAR | Status: AC
  Filled 2014-01-28: qty 1

## 2014-01-28 MED ORDER — BUPIVACAINE HCL (PF) 0.25 % IJ SOLN
INTRAMUSCULAR | Status: AC
  Filled 2014-01-28: qty 30

## 2014-01-28 MED ORDER — GABAPENTIN 400 MG PO CAPS
800.0000 mg | ORAL_CAPSULE | Freq: Four times a day (QID) | ORAL | Status: DC
  Administered 2014-01-28 – 2014-01-31 (×10): 800 mg via ORAL
  Filled 2014-01-28 (×13): qty 2

## 2014-01-28 MED ORDER — ROCURONIUM BROMIDE 100 MG/10ML IV SOLN
INTRAVENOUS | Status: AC
  Filled 2014-01-28: qty 1

## 2014-01-28 MED ORDER — CHLORHEXIDINE GLUCONATE CLOTH 2 % EX PADS: 6.0000 | MEDICATED_PAD | Freq: Once | CUTANEOUS | Status: DC

## 2014-01-28 MED ORDER — HYDROMORPHONE HCL PF 2 MG/ML IJ SOLN
2.0000 mg | Freq: Once | INTRAMUSCULAR | Status: AC
  Administered 2014-01-28: 2 mg via INTRAVENOUS
  Filled 2014-01-28: qty 1

## 2014-01-28 MED ORDER — PROPOFOL 10 MG/ML IV BOLUS
INTRAVENOUS | Status: DC | PRN
  Administered 2014-01-28: 120 mg via INTRAVENOUS

## 2014-01-28 MED ORDER — MIDAZOLAM HCL 2 MG/2ML IJ SOLN
INTRAMUSCULAR | Status: AC
  Filled 2014-01-28: qty 2

## 2014-01-28 MED ORDER — DIPHENHYDRAMINE HCL 12.5 MG/5ML PO ELIX: 12.5000 mg | ORAL_SOLUTION | ORAL | Status: DC | PRN

## 2014-01-28 MED ORDER — HYDROMORPHONE HCL PF 1 MG/ML IJ SOLN
INTRAMUSCULAR | Status: AC
  Administered 2014-01-29: 1 mg via INTRAVENOUS
  Filled 2014-01-28: qty 1

## 2014-01-28 MED ORDER — DEXAMETHASONE SODIUM PHOSPHATE 10 MG/ML IJ SOLN
INTRAMUSCULAR | Status: AC
  Filled 2014-01-28: qty 1

## 2014-01-28 MED ORDER — LACTATED RINGERS IV SOLN
INTRAVENOUS | Status: DC
  Administered 2014-01-28 (×2): via INTRAVENOUS

## 2014-01-28 MED ORDER — TRANEXAMIC ACID 100 MG/ML IV SOLN
1000.0000 mg | INTRAVENOUS | Status: AC
  Administered 2014-01-28: 1000 mg via INTRAVENOUS
  Filled 2014-01-28: qty 10

## 2014-01-28 MED ORDER — DOCUSATE SODIUM 100 MG PO CAPS
100.0000 mg | ORAL_CAPSULE | Freq: Two times a day (BID) | ORAL | Status: DC
  Administered 2014-01-28 – 2014-01-31 (×6): 100 mg via ORAL

## 2014-01-28 MED ORDER — SODIUM CHLORIDE 0.9 % IJ SOLN
INTRAMUSCULAR | Status: AC
  Filled 2014-01-28: qty 10

## 2014-01-28 MED ORDER — ROCURONIUM BROMIDE 100 MG/10ML IV SOLN
INTRAVENOUS | Status: DC | PRN
  Administered 2014-01-28: 40 mg via INTRAVENOUS

## 2014-01-28 MED ORDER — METOCLOPRAMIDE HCL 10 MG PO TABS: 5.0000 mg | ORAL_TABLET | Freq: Three times a day (TID) | ORAL | Status: DC | PRN

## 2014-01-28 MED ORDER — NEOSTIGMINE METHYLSULFATE 1 MG/ML IJ SOLN
INTRAMUSCULAR | Status: DC | PRN
  Administered 2014-01-28: 4 mg via INTRAVENOUS

## 2014-01-28 MED ORDER — PROMETHAZINE HCL 25 MG/ML IJ SOLN: 6.2500 mg | INTRAMUSCULAR | Status: DC | PRN

## 2014-01-28 MED ORDER — POLYETHYLENE GLYCOL 3350 17 G PO PACK: 17.0000 g | PACK | Freq: Every day | ORAL | Status: DC | PRN

## 2014-01-28 MED ORDER — METHOCARBAMOL 100 MG/ML IJ SOLN
500.0000 mg | Freq: Four times a day (QID) | INTRAVENOUS | Status: DC | PRN
  Administered 2014-01-28: 500 mg via INTRAVENOUS
  Filled 2014-01-28 (×3): qty 5

## 2014-01-28 MED ORDER — BUPIVACAINE HCL 0.25 % IJ SOLN
INTRAMUSCULAR | Status: DC | PRN
  Administered 2014-01-28: 20 mL

## 2014-01-28 MED ORDER — SODIUM CHLORIDE 0.9 % IV SOLN: INTRAVENOUS | Status: DC

## 2014-01-28 MED ORDER — LACTATED RINGERS IV SOLN: INTRAVENOUS | Status: DC

## 2014-01-28 MED ORDER — GLYCOPYRROLATE 0.2 MG/ML IJ SOLN
INTRAMUSCULAR | Status: AC
  Filled 2014-01-28: qty 3

## 2014-01-28 MED ORDER — ONDANSETRON HCL 4 MG/2ML IJ SOLN: 4.0000 mg | Freq: Four times a day (QID) | INTRAMUSCULAR | Status: DC | PRN

## 2014-01-28 MED ORDER — ONDANSETRON HCL 4 MG PO TABS: 4.0000 mg | ORAL_TABLET | Freq: Four times a day (QID) | ORAL | Status: DC | PRN

## 2014-01-28 MED ORDER — PROPOFOL 10 MG/ML IV BOLUS
INTRAVENOUS | Status: AC
  Filled 2014-01-28: qty 20

## 2014-01-28 MED ORDER — METHOCARBAMOL 500 MG PO TABS
500.0000 mg | ORAL_TABLET | Freq: Four times a day (QID) | ORAL | Status: DC | PRN
  Administered 2014-01-29 – 2014-01-31 (×3): 500 mg via ORAL
  Filled 2014-01-28 (×3): qty 1

## 2014-01-28 MED ORDER — ALPRAZOLAM 1 MG PO TABS: 1.0000 mg | ORAL_TABLET | Freq: Two times a day (BID) | ORAL | Status: DC | PRN

## 2014-01-28 MED ORDER — VANCOMYCIN HCL IN DEXTROSE 1-5 GM/200ML-% IV SOLN
1000.0000 mg | Freq: Two times a day (BID) | INTRAVENOUS | Status: AC
  Administered 2014-01-29: 1000 mg via INTRAVENOUS
  Filled 2014-01-28: qty 200

## 2014-01-28 MED ORDER — RIVAROXABAN 10 MG PO TABS
10.0000 mg | ORAL_TABLET | Freq: Every day | ORAL | Status: DC
  Administered 2014-01-29 – 2014-01-31 (×3): 10 mg via ORAL
  Filled 2014-01-28 (×4): qty 1

## 2014-01-28 MED ORDER — HYDROMORPHONE HCL PF 1 MG/ML IJ SOLN
0.2500 mg | INTRAMUSCULAR | Status: DC | PRN
  Administered 2014-01-28: 0.25 mg via INTRAVENOUS
  Administered 2014-01-28 (×2): 0.5 mg via INTRAVENOUS

## 2014-01-28 MED ORDER — FENTANYL CITRATE 0.05 MG/ML IJ SOLN
INTRAMUSCULAR | Status: AC
  Filled 2014-01-28: qty 5

## 2014-01-28 MED ORDER — PANTOPRAZOLE SODIUM 40 MG PO TBEC
40.0000 mg | DELAYED_RELEASE_TABLET | Freq: Every day | ORAL | Status: DC
  Administered 2014-01-29 – 2014-01-31 (×3): 40 mg via ORAL
  Filled 2014-01-28 (×4): qty 1

## 2014-01-28 MED ORDER — SODIUM CHLORIDE 0.9 % IJ SOLN
INTRAMUSCULAR | Status: AC
  Filled 2014-01-28: qty 50

## 2014-01-28 MED ORDER — DEXTROSE-NACL 5-0.9 % IV SOLN
INTRAVENOUS | Status: DC
  Administered 2014-01-28: 1000 mL via INTRAVENOUS
  Administered 2014-01-29 – 2014-01-30 (×4): via INTRAVENOUS

## 2014-01-28 MED ORDER — VANCOMYCIN HCL IN DEXTROSE 1-5 GM/200ML-% IV SOLN: 1000.0000 mg | INTRAVENOUS | Status: DC | PRN

## 2014-01-28 MED ORDER — ACETAMINOPHEN 325 MG PO TABS: 650.0000 mg | ORAL_TABLET | Freq: Four times a day (QID) | ORAL | Status: DC | PRN

## 2014-01-28 MED ORDER — FENTANYL CITRATE 0.05 MG/ML IJ SOLN
INTRAMUSCULAR | Status: DC | PRN
  Administered 2014-01-28 (×2): 25 ug via INTRAVENOUS
  Administered 2014-01-28: 50 ug via INTRAVENOUS
  Administered 2014-01-28: 25 ug via INTRAVENOUS
  Administered 2014-01-28: 100 ug via INTRAVENOUS
  Administered 2014-01-28 (×2): 25 ug via INTRAVENOUS
  Administered 2014-01-28: 50 ug via INTRAVENOUS
  Administered 2014-01-28: 25 ug via INTRAVENOUS

## 2014-01-28 MED ORDER — CLONAZEPAM 1 MG PO TABS
1.0000 mg | ORAL_TABLET | Freq: Every day | ORAL | Status: DC
  Administered 2014-01-28: 1 mg via ORAL
  Filled 2014-01-28 (×2): qty 1

## 2014-01-28 MED ORDER — BUPIVACAINE LIPOSOME 1.3 % IJ SUSP
20.0000 mL | Freq: Once | INTRAMUSCULAR | Status: DC
  Filled 2014-01-28: qty 20

## 2014-01-28 MED ORDER — VANCOMYCIN HCL 1000 MG IV SOLR
1000.0000 mg | INTRAVENOUS | Status: DC | PRN
  Administered 2014-01-28: 1000 mg via INTRAVENOUS

## 2014-01-28 MED ORDER — TRAMADOL HCL 50 MG PO TABS: 50.0000 mg | ORAL_TABLET | Freq: Four times a day (QID) | ORAL | Status: DC | PRN

## 2014-01-28 MED ORDER — METOCLOPRAMIDE HCL 5 MG/ML IJ SOLN: 5.0000 mg | Freq: Three times a day (TID) | INTRAMUSCULAR | Status: DC | PRN

## 2014-01-28 MED ORDER — DEXAMETHASONE SODIUM PHOSPHATE 10 MG/ML IJ SOLN
10.0000 mg | Freq: Once | INTRAMUSCULAR | Status: AC
  Administered 2014-01-28: 10 mg via INTRAVENOUS

## 2014-01-28 MED ORDER — SODIUM CHLORIDE 0.9 % IJ SOLN
INTRAMUSCULAR | Status: DC | PRN
  Administered 2014-01-28: 17:00:00

## 2014-01-28 MED ORDER — ACETAMINOPHEN 10 MG/ML IV SOLN
1000.0000 mg | Freq: Once | INTRAVENOUS | Status: AC
  Administered 2014-01-28: 1000 mg via INTRAVENOUS
  Filled 2014-01-28: qty 100

## 2014-01-28 MED ORDER — FLEET ENEMA 7-19 GM/118ML RE ENEM: 1.0000 | ENEMA | Freq: Once | RECTAL | Status: AC | PRN

## 2014-01-28 MED ORDER — HYDROMORPHONE HCL PF 1 MG/ML IJ SOLN
0.5000 mg | INTRAMUSCULAR | Status: DC | PRN
  Administered 2014-01-28 (×2): 1 mg via INTRAVENOUS
  Administered 2014-01-29: 0.5 mg via INTRAVENOUS
  Administered 2014-01-29 – 2014-01-30 (×5): 1 mg via INTRAVENOUS
  Administered 2014-01-30: 0.5 mg via INTRAVENOUS
  Administered 2014-01-30: 1 mg via INTRAVENOUS
  Filled 2014-01-28 (×11): qty 1

## 2014-01-28 SURGICAL SUPPLY — 59 items
BAG ZIPLOCK 12X15 (MISCELLANEOUS) ×6 IMPLANT
BIT DRILL 2.8X128 (BIT) ×2 IMPLANT
BLADE EXTENDED COATED 6.5IN (ELECTRODE) ×2 IMPLANT
BLADE SAW SAG 73X25 THK (BLADE) ×1
BLADE SAW SGTL 73X25 THK (BLADE) ×1 IMPLANT
CONT SPECI 4OZ STER CLIK (MISCELLANEOUS) ×2 IMPLANT
CUP TRID HEMIS MULTI (Orthopedic Implant) ×2 IMPLANT
DRAPE INCISE IOBAN 66X45 STRL (DRAPES) ×2 IMPLANT
DRAPE ORTHO SPLIT 77X108 STRL (DRAPES) ×2
DRAPE POUCH INSTRU U-SHP 10X18 (DRAPES) ×2 IMPLANT
DRAPE SURG ORHT 6 SPLT 77X108 (DRAPES) ×2 IMPLANT
DRAPE U-SHAPE 47X51 STRL (DRAPES) ×2 IMPLANT
DRSG EMULSION OIL 3X16 NADH (GAUZE/BANDAGES/DRESSINGS) ×2 IMPLANT
DRSG MEPILEX BORDER 4X12 (GAUZE/BANDAGES/DRESSINGS) ×2 IMPLANT
DRSG MEPILEX BORDER 4X4 (GAUZE/BANDAGES/DRESSINGS) IMPLANT
DRSG MEPILEX BORDER 4X8 (GAUZE/BANDAGES/DRESSINGS) ×2 IMPLANT
DURAPREP 26ML APPLICATOR (WOUND CARE) ×2 IMPLANT
ELECT REM PT RETURN 9FT ADLT (ELECTROSURGICAL) ×6
ELECTRODE REM PT RTRN 9FT ADLT (ELECTROSURGICAL) ×3 IMPLANT
EVACUATOR 1/8 PVC DRAIN (DRAIN) IMPLANT
FACESHIELD LNG OPTICON STERILE (SAFETY) ×8 IMPLANT
GLOVE BIO SURGEON STRL SZ7.5 (GLOVE) ×2 IMPLANT
GLOVE BIO SURGEON STRL SZ8 (GLOVE) ×2 IMPLANT
GLOVE BIOGEL PI IND STRL 8 (GLOVE) ×3 IMPLANT
GLOVE BIOGEL PI INDICATOR 8 (GLOVE) ×3
GOWN STRL REUS W/TWL LRG LVL3 (GOWN DISPOSABLE) ×2 IMPLANT
GOWN STRL REUS W/TWL XL LVL3 (GOWN DISPOSABLE) ×2 IMPLANT
HEAD FEM STD 28X+12 (Hips) ×2 IMPLANT
IMMOBILIZER KNEE 20 (SOFTGOODS) IMPLANT
INSERT REST ADM X3 28 28/52 (Orthopedic Implant) ×2 IMPLANT
KIT BASIN OR (CUSTOM PROCEDURE TRAY) ×2 IMPLANT
LINER MDM 46MM (Orthopedic Implant) ×2 IMPLANT
MANIFOLD NEPTUNE II (INSTRUMENTS) ×2 IMPLANT
NDL SAFETY ECLIPSE 18X1.5 (NEEDLE) ×1 IMPLANT
NEEDLE HYPO 18GX1.5 SHARP (NEEDLE) ×1
NS IRRIG 1000ML POUR BTL (IV SOLUTION) ×2 IMPLANT
PACK TOTAL JOINT (CUSTOM PROCEDURE TRAY) ×2 IMPLANT
PASSER SUT SWANSON 36MM LOOP (INSTRUMENTS) ×2 IMPLANT
POSITIONER SURGICAL ARM (MISCELLANEOUS) ×2 IMPLANT
SCREW OSTEO 25MM (Screw) ×2 IMPLANT
SCREW OSTEO 30MM (Screw) ×2 IMPLANT
SPONGE GAUZE 4X4 12PLY (GAUZE/BANDAGES/DRESSINGS) ×2 IMPLANT
SPONGE LAP 18X18 X RAY DECT (DISPOSABLE) ×2 IMPLANT
STAPLER VISISTAT 35W (STAPLE) ×4 IMPLANT
STEM SLVE SOLUTION STRT 8IN (Hips) ×2 IMPLANT
SUCTION FRAZIER TIP 10 FR DISP (SUCTIONS) ×2 IMPLANT
SUT ETHIBOND NAB CT1 #1 30IN (SUTURE) ×4 IMPLANT
SUT VIC AB 1 CT1 27 (SUTURE) ×3
SUT VIC AB 1 CT1 27XBRD ANTBC (SUTURE) ×3 IMPLANT
SUT VIC AB 2-0 CT1 27 (SUTURE) ×4
SUT VIC AB 2-0 CT1 TAPERPNT 27 (SUTURE) ×4 IMPLANT
SUT VLOC 180 0 24IN GS25 (SUTURE) ×6 IMPLANT
SWAB COLLECTION DEVICE MRSA (MISCELLANEOUS) ×2 IMPLANT
SYR 20CC LL (SYRINGE) ×2 IMPLANT
SYR 50ML LL SCALE MARK (SYRINGE) ×2 IMPLANT
TOWEL OR 17X26 10 PK STRL BLUE (TOWEL DISPOSABLE) ×4 IMPLANT
TRAY FOLEY CATH 14FRSI W/METER (CATHETERS) ×2 IMPLANT
TUBE ANAEROBIC SPECIMEN COL (MISCELLANEOUS) ×2 IMPLANT
WATER STERILE IRR 1500ML POUR (IV SOLUTION) ×4 IMPLANT

## 2014-01-28 NOTE — Anesthesia Preprocedure Evaluation (Addendum)
Anesthesia Evaluation  Patient identified by MRN, date of birth, ID band Patient awake    Reviewed: Allergy & Precautions, H&P , NPO status , Patient's Chart, lab work & pertinent test results  Airway Mallampati: II TM Distance: >3 FB Neck ROM: full    Dental  (+) Caps and Dental Advisory Given Cap right upper front:   Pulmonary Current Smoker,  breath sounds clear to auscultation  Pulmonary exam normal       Cardiovascular Exercise Tolerance: Good negative cardio ROS  Rhythm:regular Rate:Normal     Neuro/Psych Back fusions negative neurological ROS  negative psych ROS   GI/Hepatic negative GI ROS, hiatal hernia, GERD-  Medicated and Controlled,(+) Hepatitis -, AutoimmuneHepatitis 1991.  No problems now   Endo/Other  negative endocrine ROS  Renal/GU negative Renal ROS  negative genitourinary   Musculoskeletal   Abdominal   Peds  Hematology negative hematology ROS (+)   Anesthesia Other Findings K 3.0  Na 136  Reproductive/Obstetrics negative OB ROS                          Anesthesia Physical  Anesthesia Plan  ASA: III  Anesthesia Plan: General   Post-op Pain Management:    Induction: Intravenous  Airway Management Planned: Oral ETT  Additional Equipment:   Intra-op Plan:   Post-operative Plan: Extubation in OR  Informed Consent: I have reviewed the patients History and Physical, chart, labs and discussed the procedure including the risks, benefits and alternatives for the proposed anesthesia with the patient or authorized representative who has indicated his/her understanding and acceptance.   Dental Advisory Given  Plan Discussed with: CRNA and Surgeon  Anesthesia Plan Comments:         Anesthesia Quick Evaluation

## 2014-01-28 NOTE — Brief Op Note (Signed)
01/28/2014  5:21 PM  PATIENT:  Suzanne RoadsSusan C [name]  63 y.o. female  PRE-OPERATIVE DIAGNOSIS:  Resection arthroplasty of the left hip  POST-OPERATIVE DIAGNOSIS:  Resection arthroplasty of the left hip  PROCEDURE:  Procedure(s): LEFT HIP ARTHROPLASTY REIMPLANTATION  (Left)  SURGEON:  Surgeon(s) and Role:    * Loanne DrillingFrank V Ronniesha Seibold, MD - Primary  PHYSICIAN ASSISTANT:   ASSISTANTS: [name], PA-C   ANESTHESIA:   general  EBL:  Total I/O In: 1600 [I.V.:1600] Out: 1000 [Urine:500; Blood:500]  BLOOD ADMINISTERED:none  DRAINS: none   LOCAL MEDICATIONS USED:  OTHER Exparel  COUNTS:  YES  TOURNIQUET:  * No tourniquets in log *  DICTATION: .Other Dictation: Dictation Number [id number]  PLAN OF CARE: Admit to inpatient   PATIENT DISPOSITION:  PACU - hemodynamically stable.

## 2014-01-28 NOTE — Preoperative (Signed)
Beta Blockers   Reason not to administer Beta Blockers:Not Applicable 

## 2014-01-28 NOTE — Anesthesia Postprocedure Evaluation (Signed)
  Anesthesia Post-op Note  Patient: Suzanne CoxSusan C [name]  Procedure(s) Performed: Procedure(s) (LRB): LEFT HIP ARTHROPLASTY REIMPLANTATION  (Left)  Patient Location: PACU  Anesthesia Type: General  Level of Consciousness: awake and alert   Airway and Oxygen Therapy: Patient Spontanous Breathing  Post-op Pain: mild  Post-op Assessment: Post-op Vital signs reviewed, Patient's Cardiovascular Status Stable, Respiratory Function Stable, Patent Airway and No signs of Nausea or vomiting  Last Vitals:  Filed Vitals:   01/28/14 1826  BP: 105/69  Pulse: 83  Temp: 36.9 C  Resp: 12    Post-op Vital Signs: stable   Complications: No apparent anesthesia complications

## 2014-01-28 NOTE — Transfer of Care (Signed)
Immediate Anesthesia Transfer of Care Note  Patient: Suzanne CoxSusan C [name]  Procedure(s) Performed: Procedure(s): LEFT HIP ARTHROPLASTY REIMPLANTATION  (Left)  Patient Location: PACU  Anesthesia Type:General  Level of Consciousness: awake, alert , oriented and patient cooperative  Airway & Oxygen Therapy: Patient Spontanous Breathing and Patient connected to face mask oxygen  Post-op Assessment: Report given to PACU RN and Post -op Vital signs reviewed and stable  Post vital signs: Reviewed and stable  Complications: No apparent anesthesia complications

## 2014-01-28 NOTE — Interval H&P Note (Signed)
History and Physical Interval Note:  01/28/2014 2:13 PM  [name]  has presented today for surgery, with the diagnosis of Resection arthroplasty of the left hip  The various methods of treatment have been discussed with the patient and family. After consideration of risks, benefits and other options for treatment, the patient has consented to  Procedure(s): LEFT HIP ARTHROPLASTY REIMPLANTATION  (Left) as a surgical intervention .  The patient's history has been reviewed, patient examined, no change in status, stable for surgery.  I have reviewed the patient's chart and labs.  Questions were answered to the patient's satisfaction.     Loanne DrillingALUISIO,Melvena Vink V

## 2014-01-29 LAB — CBC
HEMATOCRIT: 30.5 % — AB (ref 36.0–46.0)
Hemoglobin: 9.2 g/dL — ABNORMAL LOW (ref 12.0–15.0)
MCH: 26.1 pg (ref 26.0–34.0)
MCHC: 30.2 g/dL (ref 30.0–36.0)
MCV: 86.6 fL (ref 78.0–100.0)
Platelets: 343 10*3/uL (ref 150–400)
RBC: 3.52 MIL/uL — AB (ref 3.87–5.11)
RDW: 16.5 % — ABNORMAL HIGH (ref 11.5–15.5)
WBC: 12 10*3/uL — ABNORMAL HIGH (ref 4.0–10.5)

## 2014-01-29 LAB — BASIC METABOLIC PANEL
BUN: 9 mg/dL (ref 6–23)
CO2: 27 mEq/L (ref 19–32)
Calcium: 8.3 mg/dL — ABNORMAL LOW (ref 8.4–10.5)
Chloride: 99 mEq/L (ref 96–112)
Creatinine, Ser: 0.47 mg/dL — ABNORMAL LOW (ref 0.50–1.10)
GFR calc Af Amer: >90 mL/min (ref >90)
GLUCOSE: 162 mg/dL — AB (ref 70–99)
Potassium: 4.5 mEq/L (ref 3.7–5.3)
Sodium: 133 mEq/L — ABNORMAL LOW (ref 137–147)

## 2014-01-29 LAB — GRAM STAIN: Gram Stain: NONE SEEN

## 2014-01-29 MED ORDER — CLONAZEPAM 1 MG PO TABS
1.0000 mg | ORAL_TABLET | Freq: Every day | ORAL | Status: DC
  Administered 2014-01-29 – 2014-01-30 (×2): 1 mg via ORAL
  Filled 2014-01-29 (×2): qty 1
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
## 2014-01-29 NOTE — Op Note (Signed)
[name]:  [name], Suzanne                 ACCOUNT NO.:  [id number]  MEDICAL RECORD NO.:  [id number]  LOCATION:  1601                         [facility]  PHYSICIAN:  Ollen GrossFrank Yu Peggs, M.D.    DATE OF BIRTH:  18-Jun-1951  DATE OF PROCEDURE:  01/28/2014 DATE OF DISCHARGE:                              OPERATIVE REPORT   PREOPERATIVE DIAGNOSIS:  Resection arthroplasty, left hip secondary to infection.  POSTOPERATIVE DIAGNOSIS:  Resection arthroplasty, left hip secondary to infection.  PROCEDURE:  Left total hip arthroplasty, reimplantation.  SURGEON:  Ollen GrossFrank Alya Smaltz, M.D.  ASSISTANT:  Alexzandrew L. Perkins, P.A.C.  ANESTHESIA:  General.  ESTIMATED BLOOD LOSS:  500 mL.  DRAINS:  Hemovac x1.  COMPLICATIONS:  None.  CONDITION:  Stable to recovery.  BRIEF CLINICAL NOTE:  [name] is a 63 year old female, long complex history in regard to her left hip.  She most recently had a resection arthroplasty due to infection.  Six weeks of IV antibiotics.  Recent labs have shown normalization of white count, sed rate, and C-reactive protein.  She presents now for total hip arthroplasty reimplantation.  PROCEDURE IN DETAIL:  After successful administration of general anesthetic, the patient was placed in the right lateral decubitus position with the left side up and held with a hip positioner.  The left lower extremity was isolated from her perineum with plastic drapes and prepped and draped in a usual sterile fashion.  Previous long posterolateral incisions were utilized.  Skin cut with a 10 blade through the subcutaneous tissue to the level of the fascia lata, which was incised in line with the skin incision.  The sciatic nerve was palpated and protected.  Then incised the fascia of the vastus lateralis and elevated the muscle off the intermuscular septum.  The area where we previously placed a plate for periprosthetic fracture had angulated and the 2 proximal cables had broken.  We removed  those cables.  I did not re-cable this, as the bone quality was extremely soft proximally.  We then addressed the hip joint.  I dislocated the femoral head from the Prostalac socket.  I was able to remove the cement and the stem in one piece.  We then retracted the femur anteriorly to gain acetabular exposure.  Note that, once we opened the capsule, we took fluid specimens and sent for stat Gram-stain to CNS, which was negative. After obtaining the specimen, then 1 g of vancomycin was administered. Once we addressed the acetabulum, the acetabular retractors were placed and the socket is removed.  Cement is also removed.  I began reaming a 47 mm coursing increments of 2-55 mm for placement of 56 mm socket.  The 56 mm porous coated hemispherical Stryker acetabular shell was placed in anatomic position and transfixed with 2 dome screws with excellent purchase.  This was very stable cup as I re-threaded the impactor back into the socket and the entire pelvis was moving as a single unit.  We then placed the metal liner for the MDM composite socket.  The femur was then addressed.  We drilled through the pedestal at the distal aspect and tip of the previous stem.  I then used the ball  tip reamers to ream up to 14.5 mm for placement of a 15 mm solution stem. The 8 inch 15 mm trial was placed and impacted in 20 degrees of anteversion.  We impacted it though it was felt to be the appropriate level in relation to the greater trochanter.  The trial 28 mm +5 head was placed with the 46 bipolar component for the MDM system.  It is reduced and the leg length was still a little short, so we went to a +12.5 head and that had the most appropriate leg length.  Stability was excellent with this construct.  At this time, we had already been notified that the Gram-stain was negative, thus we decided to proceed with impacting the stem.  It is 8-inch straight solution stem 15 mm diameter.  It is intact to  appropriate level on the greater trochanter, and then the 28+ 12.5 head was placed with the polyethylene bipolar component and it was reduced.  She had excellent stability throughout full extension, full external rotation, 70 degrees flexion, 40 degrees adduction, 90 degrees internal rotation, 90 degrees flexion, and 70 degrees of internal rotation.  By placing the left leg on top of the right, the leg lengths were equalized.  When we had done this, it reduced the femur perfectly and we did not need to put any additional cables proximally.  In fact, the entire construct of the previous plate and cables had tightened up sufficiently and we did not need to use any additional fixation on that plate.  Wound was then copiously irrigated with saline solution.  Fascia of the vastus lateralis was closed with running #1 V-Loc suture.  We reattached the posterior pseudocapsule to the femur proximally.  The fascia lata was then closed over Hemovac drain with a running #1 V-Loc suture.  A 20 mL of Exparel with 30 mL of saline injected into the fascia lata and the gluteal muscles and subcu tissues.  Additional 20 mL of 0.25% Marcaine was injected into the same tissues.  We then closed the subcu with interrupted 2-0 Vicryl and skin with staples.  The drains hooked to suction.  Incision cleaned and dried and bulky sterile dressing applied.  She was then awakened and transported to recovery in stable condition.  Note that surgical assistant was a medical necessity for this procedure in order to safely extract the trial stem, safely place the femur in appropriate position for placement of the new prosthesis and for preparation of the femur and acetabulum.     Ollen Gross, M.D.     FA/MEDQ  D:  01/28/2014  T:  01/29/2014  Job:  [id number]

## 2014-01-29 NOTE — Evaluation (Signed)
Physical Therapy Evaluation Patient Details Name: [name] MRN: [id number] DOB: 07-03-1951 Today's Date: 01/29/2014 Time: 1340-1416 PT Time Calculation (min): 36 min  PT Assessment / Plan / Recommendation History of Present Illness     Clinical Impression  Pt s/p reimplantation of L THR presents with decreased L LE strength/ROM, TWB, post THP, need for ABD brace on L LE and post op pain limiting functional mobility.  Pt should progress to d/c home with family assist and HHPT follow up.  PT Assessment  Patient needs continued PT services    Follow Up Recommendations  Home health PT    Does the patient have the potential to tolerate intense rehabilitation      Barriers to Discharge        Equipment Recommendations  Rolling walker with 5" wheels (Pt is 5'0)    Recommendations for Other Services OT consult   Frequency 7X/week    Precautions / Restrictions Precautions Precautions: Posterior Hip Required Braces or Orthoses: Knee Immobilizer - Left;Other Brace/Splint Knee Immobilizer - Left: Other (comment) (on in bed.) Other Brace/Splint: Abd splint on when working with PT or when OOB per Dr Despina Hick note written 01/29/14 Restrictions Weight Bearing Restrictions: Yes LLE Weight Bearing: Touchdown weight bearing   Pertinent Vitals/Pain 5/10; premed,       Mobility  Bed Mobility Overal bed mobility: Needs Assistance Bed Mobility: Supine to Sit Supine to sit: Min assist General bed mobility comments: cues for sequence and adherence to THP; min assist with L LE Transfers Overall transfer level: Needs assistance Equipment used: Rolling walker (2 wheeled) Transfers: Sit to/from Stand Sit to Stand: Min assist General transfer comment: cues for LE management, use of UEs and adherence to THP Ambulation/Gait Ambulation/Gait assistance: Min assist;Min guard Ambulation Distance (Feet): 123 Feet (twice) Assistive device: Rolling walker (2 wheeled) Gait Pattern/deviations:  Step-to pattern;Decreased step length - right;Decreased step length - left;Shuffle;Trunk flexed General Gait Details: Cues for posture, step to gait, position from RW and L foot placement to insure min WB on L LE.  Pt demonstrating good understanding of and ability to perform TWB    Exercises Total Joint Exercises Ankle Circles/Pumps: AROM;15 reps;Supine;Both   PT Diagnosis: Difficulty walking  PT Problem List: Decreased strength;Decreased range of motion;Decreased activity tolerance;Decreased mobility;Decreased knowledge of use of DME;Pain;Decreased knowledge of precautions PT Treatment Interventions: DME instruction;Gait training;Stair training;Functional mobility training;Therapeutic activities;Therapeutic exercise;Patient/family education     PT Goals(Current goals can be found in the care plan section) Acute Rehab PT Goals Patient Stated Goal: Walk again PT Goal Formulation: With patient Time For Goal Achievement: 02/05/14 Potential to Achieve Goals: Good  Visit Information  Last PT Received On: 01/29/14 Assistance Needed: +1       Prior Functioning  Home Living Family/patient expects to be discharged to:: Private residence Living Arrangements: Spouse/significant other Available Help at Discharge: Family Type of Home: House Home Access: Stairs to enter Secretary/administrator of Steps: 3 Entrance Stairs-Rails: Right Home Layout: One level Home Equipment: Bedside commode;Crutches;Cane - single point;Wheelchair - manual Additional Comments: Pt states home RW is old and becoming unstable Prior Function Level of Independence: Needs assistance Communication Communication: No difficulties Dominant Hand: Right    Cognition  Cognition Arousal/Alertness: Awake/alert Behavior During Therapy: WFL for tasks assessed/performed Overall Cognitive Status: Within Functional Limits for tasks assessed    Extremity/Trunk Assessment Upper Extremity Assessment Upper Extremity  Assessment: Overall WFL for tasks assessed Lower Extremity Assessment Lower Extremity Assessment: LLE deficits/detail Cervical / Trunk Assessment Cervical / Trunk  Assessment: Normal   Balance    End of Session PT - End of Session Equipment Utilized During Treatment: Gait belt;Other (comment) (ABD brace L LE) Activity Tolerance: Patient tolerated treatment well Patient left: in chair;with call bell/phone within reach Nurse Communication: Mobility status  GP     Vester Titsworth 01/29/2014, 4:03 PM

## 2014-01-29 NOTE — Discharge Instructions (Addendum)
Dr. Ollen Gross Total Joint Specialist [facility] [address], [address] [place], [place] [address] [phone]   TOTAL HIP REPLACEMENT POSTOPERATIVE DIRECTIONS    Hip Rehabilitation, Guidelines Following Surgery  The results of a hip operation are greatly improved after range of motion and muscle strengthening exercises. Follow all safety measures which are given to protect your hip. If any of these exercises cause increased pain or swelling in your joint, decrease the amount until you are comfortable again. Then slowly increase the exercises. Call your caregiver if you have problems or questions.  HOME CARE INSTRUCTIONS  Most of the following instructions are designed to prevent the dislocation of your new hip.  Remove items at home which could result in a fall. This includes throw rugs or furniture in walking pathways.  Continue medications as instructed at time of discharge.  You may have some home medications which will be placed on hold until you complete the course of blood thinner medication.  You may start showering once you are discharged home but do not submerge the incision under water. Just pat the incision dry and apply a dry gauze dressing on daily. Do not put on socks or shoes without following the instructions of your caregivers.  Sit on high chairs so your hips are not bent more than 90 degrees.  Sit on chairs with arms. Use the chair arms to help push yourself up when arising.  Keep your leg on the side of the operation out in front of you when standing up.  Arrange for the use of a toilet seat elevator so you are not sitting low.  Do not do any exercises or get in any positions that cause your toes to point in (pigeon toed).  Always sleep with a pillow between your legs. Do not lie on your side in sleep with both knees touching the bed.   Walk with walker as instructed.  You may resume a sexual relationship in one month or when given the OK by  your doctor.  Use walker as long as suggested by your caregivers.  Avoid periods of inactivity such as sitting longer than an hour when not asleep. This helps prevent blood clots.  You may return to work once you are cleared by Designer, industrial/product.  Do not drive a car for 6 weeks or until released by your surgeon.  Do not drive while taking narcotics.  Wear elastic stockings for three weeks following surgery during the day but you may remove then at night.  Make sure you keep all of your appointments after your operation with all of your doctors and caregivers. You should call the office at the above phone number and make an appointment for approximately two weeks after the date of your surgery. Change the dressing daily and reapply a dry dressing each time. Please pick up a stool softener and laxative for home use as long as you are requiring pain medications.  Continue to use ice on the hip for pain and swelling from surgery. You may notice swelling that will progress down to the foot and ankle.  This is normal after  surgery.  Elevate the leg when you are not up walking on it.   It is important for you to complete the blood thinner medication as prescribed by your doctor.  Continue to use the breathing machine which will help keep your temperature down.  It is common for your temperature to cycle up and down following surgery, especially at night  when you are not up moving around and exerting yourself.  The breathing machine keeps your lungs expanded and your temperature down.  RANGE OF MOTION AND STRENGTHENING EXERCISES  These exercises are designed to help you keep full movement of your hip joint. Follow your caregiver's or physical therapist's instructions. Perform all exercises about fifteen times, three times per day or as directed. Exercise both hips, even if you have had only one joint replacement. These exercises can be done on a training (exercise) mat, on the floor, on a table or on a bed. Use  whatever works the best and is most comfortable for you. Use music or television while you are exercising so that the exercises are a pleasant break in your day. This will make your life better with the exercises acting as a break in routine you can look forward to.  Lying on your back, slowly slide your foot toward your buttocks, raising your knee up off the floor. Then slowly slide your foot back down until your leg is straight again.  Lying on your back spread your legs as far apart as you can without causing discomfort.  Lying on your side, raise your upper leg and foot straight up from the floor as far as is comfortable. Slowly lower the leg and repeat.  Lying on your back, tighten up the muscle in the front of your thigh (quadriceps muscles). You can do this by keeping your leg straight and trying to raise your heel off the floor. This helps strengthen the largest muscle supporting your knee.  Lying on your back, tighten up the muscles of your buttocks both with the legs straight and with the knee bent at a comfortable angle while keeping your heel on the floor.   SKILLED REHAB INSTRUCTIONS: If the patient is transferred to a skilled rehab facility following release from the hospital, a list of the current medications will be sent to the facility for the patient to continue.  When discharged from the skilled rehab facility, please have the facility set up the patient's Home Health Physical Therapy prior to being released. Also, the skilled facility will be responsible for providing the patient with their medications at time of release from the facility to include their pain medication, the muscle relaxants, and their blood thinner medication. If the patient is still at the rehab facility at time of the two week follow up appointment, the skilled rehab facility will also need to assist the patient in arranging follow up appointment in our office and any transportation needs.  MAKE SURE YOU:    Understand these instructions.  Will watch your condition.  Will get help right away if you are not doing well or get worse.  Pick up stool softner and laxative for home. Do not submerge incision under water. May shower. Continue to use ice for pain and swelling from surgery. Hip precautions.  Total Hip Protocol.  Take Xarelto for two and a half more weeks, then discontinue Xarelto. Once the patient has completed the blood thinner regimen, then take a Baby 81 mg Aspirin daily for four more weeks.  WEAR YOUR HIP BRACE AT ALL TIMES!!!!!!! TOUCH DOWN WEIGHT BEARING ONLY  Information on my medicine - XARELTO (Rivaroxaban)  This medication education was reviewed with me or my healthcare representative as part of my discharge preparation.  The pharmacist that spoke with me during my hospital stay was:  Berkley HarveyLegge, Justin Marshall, [facility]  Why was Xarelto prescribed for you? Xarelto was prescribed for  you to reduce the risk of blood clots forming after orthopedic surgery. The medical term for these abnormal blood clots is venous thromboembolism (VTE).  What do you need to know about xarelto ? Take your Xarelto ONCE DAILY at the same time every day. You may take it either with or without food.  If you have difficulty swallowing the tablet whole, you may crush it and mix in applesauce just prior to taking your dose.  Take Xarelto exactly as prescribed by your doctor and DO NOT stop taking Xarelto without talking to the doctor who prescribed the medication.  Stopping without other VTE prevention medication to take the place of Xarelto may increase your risk of developing a clot.  After discharge, you should have regular check-up appointments with your healthcare provider that is prescribing your Xarelto.    What do you do if you miss a dose? If you miss a dose, take it as soon as you remember on the same day then continue your regularly scheduled once daily regimen the next day. Do not  take two doses of Xarelto on the same day.   Important Safety Information A possible side effect of Xarelto is bleeding. You should call your healthcare provider right away if you experience any of the following:   Bleeding from an injury or your nose that does not stop.   Unusual colored urine (red or dark brown) or unusual colored stools (red or black).   Unusual bruising for unknown reasons.   A serious fall or if you hit your head (even if there is no bleeding).  Some medicines may interact with Xarelto and might increase your risk of bleeding while on Xarelto. To help avoid this, consult your healthcare provider or pharmacist prior to using any new prescription or non-prescription medications, including herbals, vitamins, non-steroidal anti-inflammatory drugs (NSAIDs) and supplements.  This website has more information on Xarelto: VisitDestination.com.br.

## 2014-01-29 NOTE — Progress Notes (Signed)
UR completed 

## 2014-01-30 LAB — CBC
HEMATOCRIT: 27.3 % — AB (ref 36.0–46.0)
Hemoglobin: 8.2 g/dL — ABNORMAL LOW (ref 12.0–15.0)
MCH: 25.9 pg — AB (ref 26.0–34.0)
MCHC: 30 g/dL (ref 30.0–36.0)
MCV: 86.4 fL (ref 78.0–100.0)
Platelets: 347 10*3/uL (ref 150–400)
RBC: 3.16 MIL/uL — AB (ref 3.87–5.11)
RDW: 16.6 % — ABNORMAL HIGH (ref 11.5–15.5)
WBC: 10.7 10*3/uL — ABNORMAL HIGH (ref 4.0–10.5)

## 2014-01-30 LAB — BASIC METABOLIC PANEL
BUN: 9 mg/dL (ref 6–23)
CALCIUM: 8 mg/dL — AB (ref 8.4–10.5)
CO2: 25 meq/L (ref 19–32)
CREATININE: 0.4 mg/dL — AB (ref 0.50–1.10)
Chloride: 103 mEq/L (ref 96–112)
GFR calc Af Amer: >90 mL/min (ref >90)
GLUCOSE: 106 mg/dL — AB (ref 70–99)
Potassium: 4.1 mEq/L (ref 3.7–5.3)
SODIUM: 136 meq/L — AB (ref 137–147)

## 2014-01-30 NOTE — Progress Notes (Signed)
   CARE MANAGEMENT NOTE 01/30/2014  Patient:  [name],[name]   Account Number:  [id number]  Date Initiated:  01/30/2014  Documentation initiated by:  [facility] IncHAVIS,Yazid Pop  Subjective/Objective Assessment:   LEFT HIP ARTHROPLASTY REIMPLANTATION     Action/Plan:   lives at home with husband   Anticipated DC Date:  01/31/2014   Anticipated DC Plan:  HOME W HOME HEALTH SERVICES      DC Planning Services  CM consult      [facility]  [facility]   Choice offered to / List presented to:  [name] Patient           Status of service:  In process, will continue to follow Medicare Important Message given?   (If response is "NO", the following Medicare IM given date fields will be blank) Date Medicare IM given:   Date Additional Medicare IM given:    Discharge Disposition:    Per UR Regulation:    If discussed at Long Length of Stay Meetings, dates discussed:    Comments:  01/30/2014 1100 NCM spoke to pt and offered [facility]. States she had [facility] in the past. States she does not feel she wants HH PT at this time. Pt states she has hospital bed, RW, and 3n1 at home. Waiting final instruction from attending regarding HH PT at home. Isidoro DonningAlesia Indie Nickerson RN CCM Case Mgmt phone [phone]

## 2014-01-30 NOTE — Progress Notes (Signed)
Subjective: 2 Days Post-Op Procedure(s) (LRB): LEFT HIP ARTHROPLASTY REIMPLANTATION  (Left) Patient reports pain as 3 on 0-10 scale. Dressing changed and drain DCd. Wound looks fine, Hbg 8.2 Afebrile.  Objective: Vital signs in last 24 hours: Temp:  [97.5 F (36.4 C)-98.4 F (36.9 C)] 98 F (36.7 C) (02/08 0645) Pulse Rate:  [81-91] 85 (02/08 0645) Resp:  [16-18] 18 (02/08 0645) BP: (93-111)/(56-73) 111/73 mmHg (02/08 0645) SpO2:  [90 %-99 %] 94 % (02/08 0645) Weight:  [48.988 kg (108 lb)] 48.988 kg (108 lb) (02/07 1520)  Intake/Output from previous day: 02/07 0701 - 02/08 0700 In: 3000 [P.O.:2040; I.V.:960] Out: 2900 [Urine:2625; Drains:275] Intake/Output this shift:     Recent Labs  01/29/14 0412 01/30/14 0509  HGB 9.2* 8.2*    Recent Labs  01/29/14 0412 01/30/14 0509  WBC 12.0* 10.7*  RBC 3.52* 3.16*  HCT 30.5* 27.3*  PLT 343 347    Recent Labs  01/29/14 0412 01/30/14 0509  NA 133* 136*  K 4.5 4.1  CL 99 103  CO2 27 25  BUN 9 9  CREATININE 0.47* 0.40*  GLUCOSE 162* 106*  CALCIUM 8.3* 8.0*   No results found for this basename: LABPT, INR,  in the last 72 hours  No cellulitis present  Assessment/Plan: 2 Days Post-Op Procedure(s) (LRB): LEFT HIP ARTHROPLASTY REIMPLANTATION  (Left) Up with therapy.Will follow Hbg.  Tara Wich A 01/30/2014, 8:30 AM

## 2014-01-30 NOTE — Progress Notes (Addendum)
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   PT Comments   **Pt was up OOB bathing and toileting this morning and stated she was too tired to do morning PT session. Pt progressing well with mobility. Able to recall 2 of 3 posterior THA precautions. Pt was observed walking in room earlier this morning without hip ABD brace. Reviewed precautions and need for brace. Pt stated she didn't need to practice stairs, that she knows how to do them. Performed ambulation and therapeutic exercise for LLE today. Expect she will be ready to DC home tomorrow.*  Follow Up Recommendations  Home health PT     Does the patient have the potential to tolerate intense rehabilitation     Barriers to Discharge        Equipment Recommendations  Rolling walker with 5" wheels (Pt is 5'0)    Recommendations for Other Services OT consult  Frequency 7X/week   Progress towards PT Goals Progress towards PT goals: Progressing toward goals  Plan Current plan remains appropriate    Precautions / Restrictions Precautions Precautions: Posterior Hip;Fall Precaution Comments: Pt recalls 2 of 3 posterior THA precautions, reviewed precautions Required Braces or Orthoses: Knee Immobilizer - Left;Other Brace/Splint (Hip ABD brace) Knee Immobilizer - Left: Other (comment) Other Brace/Splint: Abd splint on when working with PT or when OOB per Dr Despina Hick note written 01/29/14 Restrictions Weight Bearing Restrictions: Yes LLE Weight Bearing: Touchdown weight bearing   Pertinent Vitals/Pain *6/10 LLE Ice applied**    Mobility  Bed Mobility Overal bed mobility: Needs Assistance Bed Mobility: Supine to Sit Supine to sit: Min  assist General bed mobility comments: cues for sequence and adherence to THP; min assist with L LE Transfers Overall transfer level: Needs assistance Equipment used: Rolling walker (2 wheeled) Transfers: Sit to/from Stand Sit to Stand: Min guard General transfer comment: cues for LE management, use of UEs and adherence to THP Ambulation/Gait Ambulation/Gait assistance: Modified independent (Device/Increase time) Ambulation Distance (Feet): 150 Feet Assistive device: Rolling walker (2 wheeled) Gait Pattern/deviations: Step-to pattern General Gait Details:   Pt demonstrating good understanding of and ability to perform TWB.     Exercises Total Joint Exercises Ankle Circles/Pumps: AROM;15 reps;Supine;Both Quad Sets: AROM;Left;5 reps;Supine Short Arc Quad: AAROM;Left;10 reps;Supine Heel Slides: AAROM;Left;10 reps;Supine Hip ABduction/ADduction: AAROM;Left;10 reps;Supine   PT Diagnosis:    PT Problem List:   PT Treatment Interventions:     PT Goals (current goals can now be found in the care plan section) Acute Rehab PT Goals Patient Stated Goal: to go on vacation PT Goal Formulation: With patient Time For Goal Achievement: 02/05/14 Potential to Achieve Goals: Good  Visit Information  Last PT Received On: 01/30/14 Assistance Needed: +1 History of Present Illness: 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    Subjective Data  Patient Stated Goal: to go on vacation   Cognition  Cognition Arousal/Alertness: Awake/alert Behavior During Therapy: WFL for tasks assessed/performed Overall Cognitive Status: Within Functional Limits for tasks assessed    Balance  Balance Overall balance assessment: Needs assistance Sitting-balance support: Feet supported Sitting balance-Leahy Scale:  Good Standing balance support: During functional activity Standing balance-Leahy Scale: Fair  End of Session PT - End of  Session Equipment Utilized During Treatment: Gait belt;Other (comment) (ABD brace L LE) Activity Tolerance: Patient tolerated treatment well Patient left: in chair;with call bell/phone within reach;with family/visitor present Nurse Communication: Mobility status   GP     Ralene BatheUhlenberg, Tayari Yankee Kistler 01/30/2014, 1:17 PM [phone]

## 2014-01-30 NOTE — Evaluation (Signed)
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   Clinical Impression   Pt is knowledgeable in hip precautions and TWB status.  She routinely uses adaptive equipment for ADL and has a completely accessible bathroom.  She has excellent support of her husband who performs all cooking and cleaning activities and will continue to as long as pt remains TWB. No further OT needs.    OT Assessment  Patient does not need any further OT services    Follow Up Recommendations  No OT follow up    Barriers to Discharge      Equipment Recommendations  None recommended by OT    Recommendations for Other Services    Frequency       Precautions / Restrictions Precautions Precautions: Posterior Hip;Fall Precaution Comments: Pt is able to state her hip precautions Required Braces or Orthoses: Knee Immobilizer - Left;Other Brace/Splint Knee Immobilizer - Left: Other (comment) Other Brace/Splint: Abd splint on when working with PT or when OOB per Dr Despina HickAlusio note written 01/29/14 Restrictions Weight Bearing Restrictions: Yes LLE Weight Bearing: Touchdown weight bearing   Pertinent Vitals/Pain Soreness of L hip, premedicated, VSS    ADL  Eating/Feeding: Independent Where Assessed - Eating/Feeding: Chair Grooming: Wash/dry hands;Wash/dry face;Supervision/safety Where Assessed - Grooming: Supported standing Upper Body Bathing: Set up Where Assessed - Upper Body Bathing: Unsupported sitting Lower Body Bathing: Supervision/safety Where Assessed - Lower Body Bathing: Unsupported sitting;Supported sit to stand Upper Body Dressing: Set up Where Assessed - Upper Body Dressing: Unsupported  sitting;Supported sit to stand Lower Body Dressing: Supervision/safety Where Assessed - Lower Body Dressing: Unsupported sitting;Supported sit to stand Toilet Transfer: Min Pension scheme managerguard Toilet Transfer Method: Sit to Baristastand Toilet Transfer Equipment: Comfort height toilet;Grab bars Toileting - ArchitectClothing Manipulation and Hygiene: Supervision/safety Where Assessed - Engineer, miningToileting Clothing Manipulation and Hygiene: Sit to stand from 3-in-1 or toilet Equipment Used: Reacher;Gait belt;Knee Immobilizer (abduction brace) Transfers/Ambulation Related to ADLs: min guard assist to ambulate with RW ADL Comments: Pt with longstanding posterior hip precautions and uses AE for self care.  Husband supervises shower.  Husband has been performing all IADL x 9 weeks since last sx.    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       Prior Functioning     Home Living Family/patient expects to be discharged to:: Private residence Living Arrangements: Spouse/significant other Available Help at Discharge: Family Type of Home: House Home Access: Stairs to enter Secretary/administratorntrance Stairs-Number of Steps: 3 Entrance Stairs-Rails: Right Home Layout: One level Home Equipment: Bedside commode;Crutches;Cane - single point;Wheelchair - manual;Shower seat;Grab bars - tub/shower;Grab bars - toilet;Adaptive equipment;Hand held shower head;Hospital bed;Walker - 2 wheels Adaptive Equipment: Reacher;Sock aid;Long-handled shoe horn;Long-handled sponge Additional Comments: Pt states home RW is old and becoming unstable Prior Function Level of Independence: Needs  assistance ADL's /  Homemaking Assistance Needed: husband does all cooking and cleaning Communication Communication: No difficulties Dominant Hand: Right         Vision/Perception Vision - History Patient Visual Report: No change from baseline   Cognition  Cognition Arousal/Alertness: Awake/alert Behavior During Therapy: WFL for tasks assessed/performed Overall Cognitive Status: Within Functional Limits for tasks assessed    Extremity/Trunk Assessment Upper Extremity Assessment Upper Extremity Assessment: Overall WFL for tasks assessed Lower Extremity Assessment Lower Extremity Assessment: Defer to PT evaluation Cervical / Trunk Assessment Cervical / Trunk Assessment: Normal     Mobility Transfers Overall transfer level: Needs assistance Equipment used: Rolling walker (2 wheeled) Transfers: Sit to/from Stand Sit to Stand: Min guard     Exercise     Balance Balance Overall balance assessment: Needs assistance Sitting-balance support: Feet supported Sitting balance-Leahy Scale: Good Standing balance support: During functional activity Standing balance-Leahy Scale: Fair   End of Session OT - End of Session Activity Tolerance: Patient tolerated treatment well Patient left: in chair;with call bell/phone within reach;with family/visitor present;with nursing/sitter in room  GO     Evern Bio 01/30/2014, 11:39 AM [phone]

## 2014-01-31 ENCOUNTER — Encounter (HOSPITAL_COMMUNITY): Payer: Self-pay | Admitting: Orthopedic Surgery

## 2014-01-31 LAB — CBC
HCT: 30.7 % — ABNORMAL LOW (ref 36.0–46.0)
Hemoglobin: 9.2 g/dL — ABNORMAL LOW (ref 12.0–15.0)
MCH: 26.1 pg (ref 26.0–34.0)
MCHC: 30 g/dL (ref 30.0–36.0)
MCV: 87.2 fL (ref 78.0–100.0)
PLATELETS: 421 10*3/uL — AB (ref 150–400)
RBC: 3.52 MIL/uL — AB (ref 3.87–5.11)
RDW: 16.7 % — ABNORMAL HIGH (ref 11.5–15.5)
WBC: 11.4 10*3/uL — ABNORMAL HIGH (ref 4.0–10.5)

## 2014-01-31 MED ORDER — AZITHROMYCIN 250 MG PO TABS: ORAL_TABLET | ORAL | Status: DC

## 2014-01-31 MED ORDER — TRAMADOL HCL 50 MG PO TABS: 50.0000 mg | ORAL_TABLET | Freq: Four times a day (QID) | ORAL | Status: DC | PRN

## 2014-01-31 MED ORDER — RIVAROXABAN 10 MG PO TABS: 10.0000 mg | ORAL_TABLET | Freq: Every day | ORAL | Status: DC

## 2014-01-31 MED ORDER — METHOCARBAMOL 500 MG PO TABS: 500.0000 mg | ORAL_TABLET | Freq: Four times a day (QID) | ORAL | Status: DC | PRN

## 2014-01-31 MED ORDER — OXYCODONE HCL 5 MG PO TABS: 5.0000 mg | ORAL_TABLET | ORAL | Status: DC | PRN
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
## 2014-01-31 NOTE — Progress Notes (Signed)
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   PT Comments   Assisted pt OOB to amb in hallway then practice steps with spouse.  Pt eager to D/c to home.  Follow Up Recommendations  Home health PT     Does the patient have the potential to tolerate intense rehabilitation     Barriers to Discharge        Equipment Recommendations  Rolling walker with 5" wheels (youth)    Recommendations for Other Services    Frequency 7X/week   Progress towards PT Goals Progress towards PT goals: Progressing toward goals  Plan      Precautions / Restrictions Precautions Precautions: Posterior Hip;Fall Precaution Comments: Pt recalls 3 of 3 posterior THA precautions, reviewed precautions Required Braces or Orthoses: Knee Immobilizer - Left;Other Brace/Splint Other Brace/Splint: Abd splint on when working with PT or when OOB per Dr Despina HickAlusio note written 01/29/14 Restrictions Weight Bearing Restrictions: Yes LLE Weight Bearing: Touchdown weight bearing   Pertinent Vitals/Pain C/o 3/10 pain    Mobility  Bed Mobility Bed Mobility: Supine to Sit Supine to sit: Supervision General bed mobility comments: increased time Transfers Overall transfer level: Needs assistance Equipment used: Rolling walker (2 wheeled) Transfers: Sit to/from Stand Sit to Stand: Supervision;Min guard General transfer comment: increased time Ambulation/Gait Ambulation Distance (Feet): 135 Feet Assistive device: Rolling walker (2 wheeled) General Gait Details:   Pt demonstrating good understanding of and ability to perform TWB.  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    Subjective Data      Cognition       Balance     End of Session PT - End of Session Equipment Utilized During Treatment: Gait belt Activity Tolerance: Patient tolerated treatment well Patient left: in bed;with call bell/phone within reach Nurse Communication:  (Pt ready for D/c to home)   Felecia ShellingLori Burleigh Brockmann  PTA [facility]  Acute  Rehab Pager      [phone]

## 2014-01-31 NOTE — Progress Notes (Signed)
[facility] the [facility]: RW  If patient discharges after hours, please call [phone].   Renard HamperLecretia Williamson 01/31/2014, 10:54 AM
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
## 2014-01-31 NOTE — Care Management Note (Signed)
    Page 1 of 2   01/31/2014     1:41:03 PM   CARE MANAGEMENT NOTE 01/31/2014  Patient:  [name],[name]   Account Number:  [id number]  Date Initiated:  01/30/2014  Documentation initiated by:  [facility]  Subjective/Objective Assessment:   LEFT HIP ARTHROPLASTY REIMPLANTATION     Action/Plan:   lives at home with husband   Anticipated DC Date:  01/31/2014   Anticipated DC Plan:  HOME W HOME HEALTH SERVICES      DC Planning Services  CM consult      [facility]   Choice offered to / List presented to:  [name] Patient   DME arranged  [facility]      DME agency  [facility]     HH arranged  HH[facility]2 PT      [facility]   Status of service:  Completed, signed off Medicare Important Message given?   (If response is "NO", the following Medicare IM given date fields will be blank) Date Medicare IM given:   Date Additional Medicare IM given:    Discharge Disposition:  HOME W HOME HEALTH SERVICES  Per UR Regulation:    If discussed at Long Length of Stay Meetings, dates discussed:    Comments:  01/31/2014 [name] BSN RN CCM [phone] Pt will require youth rolling walker and HHpt services. [facility] notified of DME and HH services need. DME delivered to patient's room. HHpt services  will start tomorrow.  01/30/2014 1100 NCM spoke to pt and [facility] States she had [facility] in the past. States she does not feel she wants HH PT at this time. Pt states she has hospital bed, RW, and 3n1 at home. Waiting final instruction from attending regarding HH PT at home. Isidoro DonningAlesia Shavis RN CCM Case Mgmt phone [phone]

## 2014-02-01 LAB — TYPE AND SCREEN
ABO/RH(D): O POS
Antibody Screen: NEGATIVE
DONOR AG TYPE: NEGATIVE
DONOR AG TYPE: NEGATIVE
UNIT DIVISION: 0
UNIT DIVISION: 0

## 2014-02-01 LAB — BODY FLUID CULTURE: CULTURE: NO GROWTH

## 2014-02-01 NOTE — Progress Notes (Signed)
Discharge summary sent to payer through [facility]  

## 2014-02-04 LAB — ANAEROBIC CULTURE: GRAM STAIN: NONE SEEN

## 2014-08-01 NOTE — Progress Notes (Signed)
Surgery 08-15-14, pre op 08-03-14, please put orders in Epic Thank you

## 2014-08-02 ENCOUNTER — Ambulatory Visit: Payer: Self-pay | Admitting: Orthopedic Surgery

## 2014-08-02 ENCOUNTER — Encounter (HOSPITAL_COMMUNITY): Payer: Self-pay | Admitting: Pharmacy Technician

## 2014-08-02 NOTE — Progress Notes (Signed)
Surgery on 08/15/14.  Preop on 08/03/14 at 200pm.  Need orders in EPIC.  Thank You.

## 2014-08-02 NOTE — Progress Notes (Signed)
Surgery 08-15-14, pre op 08-03-14, please put orders in Epic Thank you 

## 2014-08-02 NOTE — Patient Instructions (Signed)
[name]  08/02/2014   Your procedure is scheduled on:  08/15/2014    [facility]  Follow the Signs to Short Stay Center at    1330      am  Call this number if you have problems the morning of surgery: [phone]   Remember:   Do not eat food after midnite.  May have clear liquids until 0945am then npo.    Take these medicines the morning of surgery with A SIP OF WATER:    Do not wear jewelry, make-up or nail polish.  Do not wear lotions, powders, or perfumes. , deodorant.    Do not shave 48 hours prior to surgery.   Do not bring valuables to the hospital.  Contacts, dentures or bridgework may not be worn into surgery.  Leave suitcase in the car. After surgery it may be brought to your room.  For patients admitted to the hospital, checkout time is 11:00 AM the day of  discharge.        CLEAR LIQUID DIET   Foods Allowed                                                                     Foods Excluded  Coffee and tea, regular and decaf                             liquids that you cannot  Plain Jell-O in any flavor                                             see through such as: Fruit ices (not with fruit pulp)                                     milk, soups, orange juice  Iced Popsicles                                    All solid food Carbonated beverages, regular and diet                                    Cranberry, grape and apple juices Sports drinks like Gatorade Lightly seasoned clear broth or consume(fat free) Sugar, honey syrup  Sample Menu Breakfast                                Lunch                                     Supper Cranberry juice                    Beef broth  Chicken broth Jell-O                                     Grape juice                           Apple juice Coffee or tea                        Jell-O                                      Popsicle     Coffee or tea                        Coffee or tea  _____________________________________________________________________  [facility] - Preparing for Surgery Before surgery, you can play an important role.  Because skin is not sterile, your skin needs to be as free of germs as possible.  You can reduce the number of germs on your skin by washing with CHG (chlorahexidine gluconate) soap before surgery.  CHG is an antiseptic cleaner which kills germs and bonds with the skin to continue killing germs even after washing. Please DO NOT use if you have an allergy to CHG or antibacterial soaps.  If your skin becomes reddened/irritated stop using the CHG and inform your nurse when you arrive at Short Stay. Do not shave (including legs and underarms) for at least 48 hours prior to the first CHG shower.  You may shave your face/neck. Please follow these instructions carefully:  1.  Shower with CHG Soap the night before surgery and the  morning of Surgery.  2.  If you choose to wash your hair, wash your hair first as usual with your  normal  shampoo.  3.  After you shampoo, rinse your hair and body thoroughly to remove the  shampoo.                           4.  Use CHG as you would any other liquid soap.  You can apply chg directly  to the skin and wash                       Gently with a scrungie or clean washcloth.  5.  Apply the CHG Soap to your body ONLY FROM THE NECK DOWN.   Do not use on face/ open                           Wound or open sores. Avoid contact with eyes, ears mouth and genitals (private parts).                       Wash face,  Genitals (private parts) with your normal soap.             6.  Wash thoroughly, paying special attention to the area where your surgery  will be performed.  7.  Thoroughly rinse your body with warm water from the neck down.  8.  DO NOT shower/wash with your normal soap after using and rinsing off  the CHG Soap.  9.  Pat yourself dry with a  clean towel.            10.  Wear clean pajamas.            11.  Place clean sheets on your bed the night of your first shower and do not  sleep with pets. Day of Surgery : Do not apply any lotions/deodorants the morning of surgery.  Please wear clean clothes to the hospital/surgery center.  FAILURE TO FOLLOW THESE INSTRUCTIONS MAY RESULT IN THE CANCELLATION OF YOUR SURGERY PATIENT SIGNATURE_________________________________  NURSE SIGNATURE__________________________________  ________________________________________________________________________  WHAT IS A BLOOD TRANSFUSION? Blood Transfusion Information  A transfusion is the replacement of blood or some of its parts. Blood is made up of multiple cells which provide different functions.  Red blood cells carry oxygen and are used for blood loss replacement.  White blood cells fight against infection.  Platelets control bleeding.  Plasma helps clot blood.  Other blood products are available for specialized needs, such as hemophilia or other clotting disorders. BEFORE THE TRANSFUSION  Who gives blood for transfusions?   Healthy volunteers who are fully evaluated to make sure their blood is safe. This is blood bank blood. Transfusion therapy is the safest it has ever been in the practice of medicine. Before blood is taken from a donor, a complete history is taken to make sure that person has no history of diseases nor engages in risky social behavior (examples are intravenous drug use or sexual activity with multiple partners). The donor's travel history is screened to minimize risk of transmitting infections, such as malaria. The donated blood is tested for signs of infectious diseases, such as HIV and hepatitis. The blood is then tested to be sure it is compatible with you in order to minimize the chance of a transfusion reaction. If you or a relative donates blood, this is often done in anticipation of surgery and is not appropriate for  emergency situations. It takes many days to process the donated blood. RISKS AND COMPLICATIONS Although transfusion therapy is very safe and saves many lives, the main dangers of transfusion include:   Getting an infectious disease.  Developing a transfusion reaction. This is an allergic reaction to something in the blood you were given. Every precaution is taken to prevent this. The decision to have a blood transfusion has been considered carefully by your caregiver before blood is given. Blood is not given unless the benefits outweigh the risks. AFTER THE TRANSFUSION  Right after receiving a blood transfusion, you will usually feel much better and more energetic. This is especially true if your red blood cells have gotten low (anemic). The transfusion raises the level of the red blood cells which carry oxygen, and this usually causes an energy increase.  The nurse administering the transfusion will monitor you carefully for complications. HOME CARE INSTRUCTIONS  No special instructions are needed after a transfusion. You may find your energy is better. Speak with your caregiver about any limitations on activity for underlying diseases you may have. SEEK MEDICAL CARE IF:   Your condition is not improving after your transfusion.  You develop redness or irritation at the intravenous (IV) site. SEEK IMMEDIATE MEDICAL CARE IF:  Any of the following symptoms occur over the next 12 hours:  Shaking chills.  You have a temperature by mouth above 102 F (38.9 C), not controlled by medicine.  Chest, back, or muscle pain.  People around you feel you are not acting correctly or  are confused.  Shortness of breath or difficulty breathing.  Dizziness and fainting.  You get a rash or develop hives.  You have a decrease in urine output.  Your urine turns a dark color or changes to pink, red, or brown. Any of the following symptoms occur over the next 10 days:  You have a temperature by  mouth above 102 F (38.9 C), not controlled by medicine.  Shortness of breath.  Weakness after normal activity.  The white part of the eye turns yellow (jaundice).  You have a decrease in the amount of urine or are urinating less often.  Your urine turns a dark color or changes to pink, red, or brown. Document Released: 12/06/2000 Document Revised: 03/02/2012 Document Reviewed: 07/25/2008 ExitCare Patient Information 2014 [place], [place].  _______________________________________________________________________  Incentive Spirometer  An incentive spirometer is a tool that can help keep your lungs clear and active. This tool measures how well you are filling your lungs with each breath. Taking long deep breaths may help reverse or decrease the chance of developing breathing (pulmonary) problems (especially infection) following:  A long period of time when you are unable to move or be active. BEFORE THE PROCEDURE   If the spirometer includes an indicator to show your best effort, your nurse or respiratory therapist will set it to a desired goal.  If possible, sit up straight or lean slightly forward. Try not to slouch.  Hold the incentive spirometer in an upright position. INSTRUCTIONS FOR USE  1. Sit on the edge of your bed if possible, or sit up as far as you can in bed or on a chair. 2. Hold the incentive spirometer in an upright position. 3. Breathe out normally. 4. Place the mouthpiece in your mouth and seal your lips tightly around it. 5. Breathe in slowly and as deeply as possible, raising the piston or the ball toward the top of the column. 6. Hold your breath for 3-5 seconds or for as long as possible. Allow the piston or ball to fall to the bottom of the column. 7. Remove the mouthpiece from your mouth and breathe out normally. 8. Rest for a few seconds and repeat Steps 1 through 7 at least 10 times every 1-2 hours when you are awake. Take your time and take a few normal  breaths between deep breaths. 9. The spirometer may include an indicator to show your best effort. Use the indicator as a goal to work toward during each repetition. 10. After each set of 10 deep breaths, practice coughing to be sure your lungs are clear. If you have an incision (the cut made at the time of surgery), support your incision when coughing by placing a pillow or rolled up towels firmly against it. Once you are able to get out of bed, walk around indoors and cough well. You may stop using the incentive spirometer when instructed by your caregiver.  RISKS AND COMPLICATIONS  Take your time so you do not get dizzy or light-headed.  If you are in pain, you may need to take or ask for pain medication before doing incentive spirometry. It is harder to take a deep breath if you are having pain. AFTER USE  Rest and breathe slowly and easily.  It can be helpful to keep track of a log of your progress. Your caregiver can provide you with a simple table to help with this. If you are using the spirometer at home, follow these instructions: SEEK MEDICAL CARE IF:  You are having difficultly using the spirometer.  You have trouble using the spirometer as often as instructed.  Your pain medication is not giving enough relief while using the spirometer.  You develop fever of 100.5 F (38.1 C) or higher. SEEK IMMEDIATE MEDICAL CARE IF:   You cough up bloody sputum that had not been present before.  You develop fever of 102 F (38.9 C) or greater.  You develop worsening pain at or near the incision site. MAKE SURE YOU:   Understand these instructions.  Will watch your condition.  Will get help right away if you are not doing well or get worse. Document Released: 04/21/2007 Document Revised: 03/02/2012 Document Reviewed: 06/22/2007 ExitCare Patient Information 2014 ExitCare, [place].   ________________________________________________________________________    Please read over  the following fact sheets that you were given: MRSA Information, coughing and deep breathing exercises, leg exercises

## 2014-08-03 ENCOUNTER — Ambulatory Visit (HOSPITAL_COMMUNITY)
Admission: RE | Admit: 2014-08-03 | Discharge: 2014-08-03 | Disposition: A | Discharge status: Home / Self Care | Payer: Medicare Other | Source: Ambulatory Visit | Attending: Orthopedic Surgery | Admitting: Orthopedic Surgery

## 2014-08-03 ENCOUNTER — Other Ambulatory Visit (HOSPITAL_COMMUNITY): Payer: Self-pay | Admitting: Orthopedic Surgery

## 2014-08-03 ENCOUNTER — Encounter (HOSPITAL_COMMUNITY): Payer: Self-pay

## 2014-08-03 ENCOUNTER — Encounter (HOSPITAL_COMMUNITY)
Admission: RE | Admit: 2014-08-03 | Discharge: 2014-08-03 | Disposition: A | Discharge status: Home / Self Care | Payer: Medicare Other | Source: Ambulatory Visit | Attending: Orthopedic Surgery | Admitting: Orthopedic Surgery

## 2014-08-03 VITALS — BP 115/58 | HR 76 | Temp 98.2°F | Resp 16 | Ht 60.0 in | Wt 110.0 lb

## 2014-08-03 DIAGNOSIS — R52 Pain, unspecified: Secondary | ICD-10-CM | POA: Diagnosis not present

## 2014-08-03 DIAGNOSIS — M79609 Pain in unspecified limb: Secondary | ICD-10-CM | POA: Insufficient documentation

## 2014-08-03 DIAGNOSIS — Z01818 Encounter for other preprocedural examination: Secondary | ICD-10-CM | POA: Insufficient documentation

## 2014-08-03 HISTORY — DX: Pneumonia, unspecified organism: J18.9

## 2014-08-03 LAB — URINALYSIS, ROUTINE W REFLEX MICROSCOPIC
BILIRUBIN URINE: NEGATIVE
Glucose, UA: NEGATIVE mg/dL
HGB URINE DIPSTICK: NEGATIVE
Ketones, ur: NEGATIVE mg/dL
Leukocytes, UA: NEGATIVE
Nitrite: NEGATIVE
Protein, ur: NEGATIVE mg/dL
Specific Gravity, Urine: 1.03 (ref 1.005–1.030)
UROBILINOGEN UA: 0.2 mg/dL (ref 0.0–1.0)
pH: 5.5 (ref 5.0–8.0)

## 2014-08-03 LAB — COMPREHENSIVE METABOLIC PANEL
ALK PHOS: 135 U/L — AB (ref 39–117)
ALT: 15 U/L (ref 0–35)
AST: 19 U/L (ref 0–37)
Albumin: 3.4 g/dL — ABNORMAL LOW (ref 3.5–5.2)
Anion gap: 10 (ref 5–15)
BUN: 22 mg/dL (ref 6–23)
CHLORIDE: 101 meq/L (ref 96–112)
CO2: 26 meq/L (ref 19–32)
CREATININE: 0.48 mg/dL — AB (ref 0.50–1.10)
Calcium: 9.2 mg/dL (ref 8.4–10.5)
GFR calc Af Amer: >90 mL/min (ref >90)
Glucose, Bld: 84 mg/dL (ref 70–99)
POTASSIUM: 4.4 meq/L (ref 3.7–5.3)
Sodium: 137 mEq/L (ref 137–147)
Total Bilirubin: <0.2 mg/dL — ABNORMAL LOW (ref 0.3–1.2)
Total Protein: 7.3 g/dL (ref 6.0–8.3)

## 2014-08-03 LAB — CBC
HEMATOCRIT: 45 % (ref 36.0–46.0)
Hemoglobin: 14 g/dL (ref 12.0–15.0)
MCH: 26.5 pg (ref 26.0–34.0)
MCHC: 31.1 g/dL (ref 30.0–36.0)
MCV: 85.2 fL (ref 78.0–100.0)
Platelets: 451 10*3/uL — ABNORMAL HIGH (ref 150–400)
RBC: 5.28 MIL/uL — ABNORMAL HIGH (ref 3.87–5.11)
RDW: 15.9 % — ABNORMAL HIGH (ref 11.5–15.5)
WBC: 12.2 10*3/uL — AB (ref 4.0–10.5)

## 2014-08-03 LAB — APTT: APTT: 36 s (ref 24–37)

## 2014-08-03 LAB — PROTIME-INR
INR: 1.07 (ref 0.00–1.49)
PROTHROMBIN TIME: 13.9 s (ref 11.6–15.2)

## 2014-08-03 LAB — SURGICAL PCR SCREEN
MRSA, PCR: NEGATIVE
Staphylococcus aureus: NEGATIVE

## 2014-08-03 NOTE — Progress Notes (Signed)
CBC results faxed via EPIC to Dr Aluisio.  

## 2014-08-08 NOTE — Progress Notes (Signed)
[facility]  ED notes in chart.  Patient discharged home from ED.  CK and Troponin negative per note.  EKG done- Normal Sinus Rhytm.  Portable CXR and Angio CT of chest done.  - negative.

## 2014-08-09 NOTE — Progress Notes (Signed)
Last office visit note with Dr Lanora ManisGary Morgan 07/06/14 on chart

## 2014-08-09 NOTE — Progress Notes (Signed)
EKG  And CXR don 07/06/14 on chart

## 2014-08-14 ENCOUNTER — Other Ambulatory Visit: Payer: Self-pay | Admitting: Orthopedic Surgery

## 2014-08-14 NOTE — H&P (Signed)
[name] DOB: 1951-10-04 Married / Language: English / Race: White Female Date of Admission:  08-15-2014 Chief Complaint:  Failed Left Total Hip Arthroplasty History of Present Illness The patient is a 63 year old female who comes in for a preoperative History and Physical. The patient is scheduled for a left left tota hip revision with proximal femoral allograft to be performed by Dr. Gus Rankin. Aluisio, MD at [facility] on 08/15/2014 . The patient is a 63 year old female presenting for a post-operative visit. The patient comes in several months out from left total hip arthroplasty (revision). The patient states that she is doing poorly at this time. The pain is under poor control at this time and describes their pain as moderate to severe. They are currently on Oxycodone for their pain. The patient is currently taking 3 pill(s) per day. [name] and [name] both are very frustrated with her lack of progress. She is at a stage where she said she will basically do anything she can to try and fix this as she can't continue to go on with her leg in this condition. She had also had a lot of knee pain and had the knee MRI which showed a pretty significant stress reaction/stress fracture in the proximal medial tibia. She also had some old degenerative change in the knee as well as old ACL reconstruction that may potentially be torn now. The knee is the least of our concerns now. She is really focusing on the hip and wanting to do something because the shortening in her leg is really causing her a lot of difficulty as is the pain. Unfortunately that proximal bone has basically collapsed and the stem has migrated distally. The only thing that could potentially could salvage her at this point would be reconstruction with her proximal femoral allograft. The problem is that her proximal bone all the way down to just below the isthmus is basically paper thin and couldn't handle any prosthesis that would be  large enough in diameter to get purchased distally. The proximal femoral allograft would reconstruct the bone proximally and allow for the fixation distally. I went over this in detail with her and [name] including procedural risks, potential complications and they want to proceed in that direction. With regards to the knee, she just needs to be off the leg as much as possible to allow that stress reaction to heal. She says she is already starting to feel a little better in the knee. We will set her up for surgical treatment for the massive revision. She is ready to proceed with the revision procedure. Risks and benefits of the surgery have been discussed with the patient and they elect to proceed with surgery.  There are on active contraindications to upcoming procedure such as ongoing infection or progressive neurological disease.   Allergies  Morphine Derivatives Itching. Dose Dependent Zithromax Z-Pak *MACROLIDES* Itching. Skin peeling  Intolerances Augmentin *PENICILLINS* Nausea, Vomiting.  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 back surgery (11 hours) Degenerative Disc Disease Diverticulitis Of Colon Hiatal Hernia Anxiety Disorder Osteoporosis Measles Mumps Pneumonia Past History Depression   Family History  Mother Deceased, Postmenopausal Osteoporosis. Age 67, peforated colon. Father Deceased, Chronic Renal Failure Syndrome, Colon Cancer, Heart disease. Age 71, Was on dialysis. Bladder Cancer. Osteoarthritis Mother. mother Osteoporosis mother  Social History  Advance Directives Living Will and Healthcare POA Number of flights of stairs before winded 2-3 Tobacco use Current every day smoker. current every day smoker; smoke(d) 1 pack(s)  per day Children 1 Alcohol use Occasional alcohol use. current drinker; drinks wine; only occasionally per week Current work status working part time Marital status married Exercise Exercises never Living situation live with spouse Post-Surgical Plans Plan to go Home.  Medication History Oxycodone-Acetaminophen (10-325MG  Tablet, Oral) Active. OxyCODONE HCl (  Tablet, Oral) Active. KlonoPIN (0.5MG  Tablet, Oral) Active. ClonazePAM (0.5MG  Tablet, Oral) Active. ALPRAZolam (  Tablet, Oral) Active. Neurontin (Oral) Specific dose unknown - Active. PROzac (  Capsule, Oral) Active.  Past Surgical History Breast Mass; Local Excision left Arthroscopy of Knee right Tonsillectomy Total Hip Replacement bilateral Carpal Tunnel Repair bilateral Spinal Fusion lower back Spinal Surgery S/P irrigation and debridement right hip S/P revision of total hip (V43.64  Z96.60) left  Review of Systems General Present- Night Sweats. Not Present- Appetite Loss, Chills, Fatigue, Feeling sick, Fever, Weight Gain and Weight Loss. Skin Not Present- Change in Hair or Nails, Itching, Psoriasis, Rash, Skin Color Changes and Ulcer. HEENT Present- Ringing in the Ears. Not Present- Hearing problems, Nose Bleed and Sensitivity to light. Neck Not Present- Neck Mass and Swollen Glands. Respiratory Not Present- Bloody sputum, Chronic Cough, Dyspnea and Snoring. Cardiovascular Not Present- Chest Pain, Leg Cramps, Palpitations, Shortness of Breath and Swelling of Extremities. Gastrointestinal Not Present- Abdominal Pain, Bloody Stool, Heartburn, Incontinence of Stool, Nausea and Vomiting. Female Genitourinary Not Present- Blood in Urine, Frequency, Incontinence, Menstrual Irregularities and Nocturia. Musculoskeletal Present- Back Pain, Joint Pain and Spasms. Not Present- Joint Stiffness, Joint Swelling, Muscle Pain and Muscle Weakness. Neurological Not Present- Burning, Dizziness, Headaches,  Numbness, Tingling and Tremor. Psychiatric Not Present- Anxiety, Depression and Memory Loss. Endocrine Not Present- Cold Intolerance, Excessive hunger, Excessive Thirst and Heat Intolerance. Hematology Not Present- Abnormal Bleeding, Anemia, Blood Clots and Easy Bruising.   Vitals Weight: 110 lb Height: 60in Body Surface Area: 1.45 m Body Mass Index: 21.48 kg/m BP: 122/66 (Sitting, Right Arm, Standard)   Physical Exam (Alezandrew L. Cyndee Giammarco III PA-C; 08/02/2014 2:50 PM) The physical exam findings are as follows: Note:Patient is a 64 year old white, petite framed female with continued right hip problems. Patient is accompanied today by her husband.  General Mental Status -Alert, cooperative and good historian. General Appearance-pleasant, Not in acute distress. Orientation-Oriented X3. Build & Nutrition-Lean, [place], Well nourished and Well developed.  Head and Neck Head-normocephalic, atraumatic . Neck Global Assessment - supple, no bruit auscultated on the right, no bruit auscultated on the left.  Eye Pupil - Bilateral-Regular and Round. Motion - Bilateral-EOMI.  Chest and Lung Exam Auscultation Breath sounds - clear at anterior chest wall and clear at posterior chest wall. Adventitious sounds - No Adventitious sounds.  Cardiovascular Auscultation Rhythm - Regular rate and rhythm. Heart Sounds - S1 WNL and S2 WNL. Murmurs & Other Heart Sounds: Murmur 1 - Location - Aortic Area and Sternal Border - Left. Timing - Holosystolic. Grade - III/VI.  Abdomen Palpation/Percussion Palpation and Percussion of the abdomen reveal - Soft and Non Tender. Note: Flat. Rigidity (guarding) - Abdomen is soft. Auscultation Auscultation of the abdomen reveals - Bowel sounds normal.  Female Genitourinary Note: Not done, not pertinent to present illness   Musculoskeletal Note: She is alert and oriented in no apparent distress. She has pain on any attempted motion  of her hip. She is about 2 inches short now. She has a lot of discomfort  in her left knee. There is a trace effusion. She is tender along the medial aspect of the knee. There is no lateral tenderness or any instability.  Radiographs: AP and lateral of the hip and femur and there was no further migration of that stem. Her proximal bone is essentially gone except for the greater trochanter.   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 Lequita Halt.  Plan is to go home.  PCP - Dr. Lequita Halt  The patient does not have any contraindications and will receive TXA (tranexamic acid) prior to surgery.  Signed electronically by Lauraine Rinne, III PA-C

## 2014-08-15 ENCOUNTER — Encounter (HOSPITAL_COMMUNITY)
Admission: RE | Disposition: A | Discharge status: Home Health | Payer: Self-pay | Source: Ambulatory Visit | Attending: Orthopedic Surgery

## 2014-08-15 ENCOUNTER — Encounter (HOSPITAL_COMMUNITY): Payer: Self-pay | Admitting: *Deleted

## 2014-08-15 ENCOUNTER — Inpatient Hospital Stay (HOSPITAL_COMMUNITY)
Admission: RE | Admit: 2014-08-15 | Discharge: 2014-08-17 | DRG: 467 | Disposition: A | Discharge status: Home Health | Payer: BC Managed Care – PPO | Source: Ambulatory Visit | Attending: Orthopedic Surgery | Admitting: Orthopedic Surgery

## 2014-08-15 ENCOUNTER — Ambulatory Visit (HOSPITAL_COMMUNITY): Payer: BC Managed Care – PPO | Admitting: Registered Nurse

## 2014-08-15 ENCOUNTER — Encounter (HOSPITAL_COMMUNITY): Payer: BC Managed Care – PPO | Admitting: Registered Nurse

## 2014-08-15 ENCOUNTER — Inpatient Hospital Stay (HOSPITAL_COMMUNITY): Payer: BC Managed Care – PPO

## 2014-08-15 DIAGNOSIS — IMO0002 Reserved for concepts with insufficient information to code with codable children: Secondary | ICD-10-CM

## 2014-08-15 DIAGNOSIS — T84018S Broken internal joint prosthesis, other site, sequela: Secondary | ICD-10-CM

## 2014-08-15 DIAGNOSIS — F411 Generalized anxiety disorder: Secondary | ICD-10-CM | POA: Diagnosis present

## 2014-08-15 DIAGNOSIS — K449 Diaphragmatic hernia without obstruction or gangrene: Secondary | ICD-10-CM | POA: Diagnosis present

## 2014-08-15 DIAGNOSIS — Z8614 Personal history of Methicillin resistant Staphylococcus aureus infection: Secondary | ICD-10-CM

## 2014-08-15 DIAGNOSIS — Z96649 Presence of unspecified artificial hip joint: Secondary | ICD-10-CM

## 2014-08-15 DIAGNOSIS — K219 Gastro-esophageal reflux disease without esophagitis: Secondary | ICD-10-CM | POA: Diagnosis present

## 2014-08-15 DIAGNOSIS — T84099A Other mechanical complication of unspecified internal joint prosthesis, initial encounter: Principal | ICD-10-CM | POA: Diagnosis present

## 2014-08-15 DIAGNOSIS — F3289 Other specified depressive episodes: Secondary | ICD-10-CM | POA: Diagnosis present

## 2014-08-15 DIAGNOSIS — Z981 Arthrodesis status: Secondary | ICD-10-CM

## 2014-08-15 DIAGNOSIS — M810 Age-related osteoporosis without current pathological fracture: Secondary | ICD-10-CM | POA: Diagnosis present

## 2014-08-15 DIAGNOSIS — Z8262 Family history of osteoporosis: Secondary | ICD-10-CM

## 2014-08-15 DIAGNOSIS — D62 Acute posthemorrhagic anemia: Secondary | ICD-10-CM | POA: Diagnosis not present

## 2014-08-15 DIAGNOSIS — E871 Hypo-osmolality and hyponatremia: Secondary | ICD-10-CM | POA: Diagnosis present

## 2014-08-15 DIAGNOSIS — F17200 Nicotine dependence, unspecified, uncomplicated: Secondary | ICD-10-CM | POA: Diagnosis present

## 2014-08-15 DIAGNOSIS — M8430XA Stress fracture, unspecified site, initial encounter for fracture: Secondary | ICD-10-CM | POA: Diagnosis present

## 2014-08-15 DIAGNOSIS — T84018A Broken internal joint prosthesis, other site, initial encounter: Secondary | ICD-10-CM

## 2014-08-15 DIAGNOSIS — Z79899 Other long term (current) drug therapy: Secondary | ICD-10-CM

## 2014-08-15 DIAGNOSIS — Y831 Surgical operation with implant of artificial internal device as the cause of abnormal reaction of the patient, or of later complication, without mention of misadventure at the time of the procedure: Secondary | ICD-10-CM | POA: Diagnosis present

## 2014-08-15 DIAGNOSIS — F329 Major depressive disorder, single episode, unspecified: Secondary | ICD-10-CM | POA: Diagnosis present

## 2014-08-15 HISTORY — PX: TOTAL HIP REVISION: SHX763

## 2014-08-15 SURGERY — TOTAL HIP REVISION
Anesthesia: General | Site: Hip | Laterality: Left

## 2014-08-15 MED ORDER — LACTATED RINGERS IV SOLN
INTRAVENOUS | Status: DC
  Administered 2014-08-15: 1000 mL via INTRAVENOUS
  Administered 2014-08-15: 20:00:00 via INTRAVENOUS

## 2014-08-15 MED ORDER — DEXAMETHASONE SODIUM PHOSPHATE 10 MG/ML IJ SOLN: 10.0000 mg | Freq: Once | INTRAMUSCULAR | Status: DC

## 2014-08-15 MED ORDER — DIPHENHYDRAMINE HCL 12.5 MG/5ML PO ELIX: 12.5000 mg | ORAL_SOLUTION | ORAL | Status: DC | PRN

## 2014-08-15 MED ORDER — NEOSTIGMINE METHYLSULFATE 10 MG/10ML IV SOLN
INTRAVENOUS | Status: AC
  Filled 2014-08-15: qty 1

## 2014-08-15 MED ORDER — FLEET ENEMA 7-19 GM/118ML RE ENEM: 1.0000 | ENEMA | Freq: Once | RECTAL | Status: AC | PRN

## 2014-08-15 MED ORDER — MENTHOL 3 MG MT LOZG
1.0000 | LOZENGE | OROMUCOSAL | Status: DC | PRN
Start: 2014-08-15 — End: 2014-08-17
  Filled 2014-08-15: qty 9

## 2014-08-15 MED ORDER — METOCLOPRAMIDE HCL 5 MG/ML IJ SOLN: 5.0000 mg | Freq: Three times a day (TID) | INTRAMUSCULAR | Status: DC | PRN

## 2014-08-15 MED ORDER — FENTANYL CITRATE 0.05 MG/ML IJ SOLN
INTRAMUSCULAR | Status: AC
  Filled 2014-08-15: qty 5

## 2014-08-15 MED ORDER — HYDROMORPHONE HCL PF 1 MG/ML IJ SOLN
INTRAMUSCULAR | Status: AC
  Filled 2014-08-15: qty 1

## 2014-08-15 MED ORDER — OXYCODONE HCL 5 MG PO TABS
5.0000 mg | ORAL_TABLET | ORAL | Status: DC | PRN
  Administered 2014-08-16 (×4): 20 mg via ORAL
  Administered 2014-08-16: 15 mg via ORAL
  Administered 2014-08-16 – 2014-08-17 (×4): 20 mg via ORAL
  Filled 2014-08-15 (×2): qty 4
  Filled 2014-08-15: qty 3
  Filled 2014-08-15 (×6): qty 4
  Filled 2014-08-15: qty 3

## 2014-08-15 MED ORDER — ACETAMINOPHEN 500 MG PO TABS
1000.0000 mg | ORAL_TABLET | Freq: Four times a day (QID) | ORAL | Status: AC
  Administered 2014-08-15 – 2014-08-16 (×4): 1000 mg via ORAL
  Filled 2014-08-15 (×5): qty 2

## 2014-08-15 MED ORDER — FENTANYL CITRATE 0.05 MG/ML IJ SOLN
INTRAMUSCULAR | Status: DC | PRN
  Administered 2014-08-15 (×2): 25 ug via INTRAVENOUS
  Administered 2014-08-15: 50 ug via INTRAVENOUS
  Administered 2014-08-15: 100 ug via INTRAVENOUS
  Administered 2014-08-15: 50 ug via INTRAVENOUS

## 2014-08-15 MED ORDER — BISACODYL 10 MG RE SUPP: 10.0000 mg | Freq: Every day | RECTAL | Status: DC | PRN

## 2014-08-15 MED ORDER — EPHEDRINE SULFATE 50 MG/ML IJ SOLN
INTRAMUSCULAR | Status: DC | PRN
  Administered 2014-08-15 (×3): 5 mg via INTRAVENOUS

## 2014-08-15 MED ORDER — NEOSTIGMINE METHYLSULFATE 10 MG/10ML IV SOLN
INTRAVENOUS | Status: DC | PRN
  Administered 2014-08-15: 2 mg via INTRAVENOUS

## 2014-08-15 MED ORDER — DEXAMETHASONE SODIUM PHOSPHATE 10 MG/ML IJ SOLN
INTRAMUSCULAR | Status: AC
  Filled 2014-08-15: qty 1

## 2014-08-15 MED ORDER — ALPRAZOLAM 1 MG PO TABS
1.0000 mg | ORAL_TABLET | Freq: Two times a day (BID) | ORAL | Status: DC | PRN
  Administered 2014-08-16: 1 mg via ORAL
  Filled 2014-08-15: qty 1

## 2014-08-15 MED ORDER — HYDROMORPHONE HCL PF 1 MG/ML IJ SOLN
0.2500 mg | INTRAMUSCULAR | Status: DC | PRN
  Administered 2014-08-15 (×6): 0.5 mg via INTRAVENOUS

## 2014-08-15 MED ORDER — METHOCARBAMOL 500 MG PO TABS
500.0000 mg | ORAL_TABLET | Freq: Four times a day (QID) | ORAL | Status: DC | PRN
  Administered 2014-08-16 (×3): 500 mg via ORAL
  Filled 2014-08-15 (×3): qty 1

## 2014-08-15 MED ORDER — CLONAZEPAM 0.5 MG PO TABS
0.5000 mg | ORAL_TABLET | Freq: Every day | ORAL | Status: DC
  Administered 2014-08-16 (×2): 0.5 mg via ORAL
  Filled 2014-08-15 (×2): qty 1

## 2014-08-15 MED ORDER — PROPOFOL 10 MG/ML IV BOLUS
INTRAVENOUS | Status: DC | PRN
  Administered 2014-08-15: 130 mg via INTRAVENOUS

## 2014-08-15 MED ORDER — ACETAMINOPHEN 650 MG RE SUPP: 650.0000 mg | Freq: Four times a day (QID) | RECTAL | Status: DC | PRN

## 2014-08-15 MED ORDER — DEXAMETHASONE 6 MG PO TABS
10.0000 mg | ORAL_TABLET | Freq: Every day | ORAL | Status: AC
  Administered 2014-08-16: 10 mg via ORAL
  Filled 2014-08-15: qty 1

## 2014-08-15 MED ORDER — DEXAMETHASONE SODIUM PHOSPHATE 10 MG/ML IJ SOLN
10.0000 mg | Freq: Every day | INTRAMUSCULAR | Status: AC
  Filled 2014-08-15: qty 1

## 2014-08-15 MED ORDER — ACETAMINOPHEN 10 MG/ML IV SOLN
1000.0000 mg | Freq: Once | INTRAVENOUS | Status: AC
  Administered 2014-08-15: 1000 mg via INTRAVENOUS
  Filled 2014-08-15: qty 100

## 2014-08-15 MED ORDER — PROMETHAZINE HCL 25 MG/ML IJ SOLN: 6.2500 mg | INTRAMUSCULAR | Status: DC | PRN

## 2014-08-15 MED ORDER — PROPOFOL 10 MG/ML IV BOLUS
INTRAVENOUS | Status: AC
  Filled 2014-08-15: qty 20

## 2014-08-15 MED ORDER — DEXTROSE 5 % IV SOLN
500.0000 mg | Freq: Four times a day (QID) | INTRAVENOUS | Status: DC | PRN
  Administered 2014-08-15: 500 mg via INTRAVENOUS
  Filled 2014-08-15: qty 5

## 2014-08-15 MED ORDER — 0.9 % SODIUM CHLORIDE (POUR BTL) OPTIME
TOPICAL | Status: DC | PRN
  Administered 2014-08-15: 3000 mL

## 2014-08-15 MED ORDER — ACETAMINOPHEN 325 MG PO TABS: 650.0000 mg | ORAL_TABLET | Freq: Four times a day (QID) | ORAL | Status: DC | PRN

## 2014-08-15 MED ORDER — GLYCOPYRROLATE 0.2 MG/ML IJ SOLN
INTRAMUSCULAR | Status: AC
  Filled 2014-08-15: qty 2

## 2014-08-15 MED ORDER — PHENYLEPHRINE 40 MCG/ML (10ML) SYRINGE FOR IV PUSH (FOR BLOOD PRESSURE SUPPORT)
PREFILLED_SYRINGE | INTRAVENOUS | Status: AC
  Filled 2014-08-15: qty 10

## 2014-08-15 MED ORDER — ROCURONIUM BROMIDE 100 MG/10ML IV SOLN
INTRAVENOUS | Status: DC | PRN
  Administered 2014-08-15 (×5): 10 mg via INTRAVENOUS
  Administered 2014-08-15: 30 mg via INTRAVENOUS
  Administered 2014-08-15: 10 mg via INTRAVENOUS

## 2014-08-15 MED ORDER — ONDANSETRON HCL 4 MG/2ML IJ SOLN
4.0000 mg | Freq: Four times a day (QID) | INTRAMUSCULAR | Status: DC | PRN
Start: 2014-08-15 — End: 2014-08-17

## 2014-08-15 MED ORDER — ASPIRIN EC 325 MG PO TBEC
325.0000 mg | DELAYED_RELEASE_TABLET | Freq: Every day | ORAL | Status: DC
  Administered 2014-08-16 – 2014-08-17 (×2): 325 mg via ORAL
  Filled 2014-08-15 (×3): qty 1

## 2014-08-15 MED ORDER — METOCLOPRAMIDE HCL 5 MG PO TABS
5.0000 mg | ORAL_TABLET | Freq: Three times a day (TID) | ORAL | Status: DC | PRN
  Filled 2014-08-15: qty 2

## 2014-08-15 MED ORDER — SODIUM CHLORIDE 0.9 % IJ SOLN
INTRAMUSCULAR | Status: DC | PRN
  Administered 2014-08-15: 20 mL

## 2014-08-15 MED ORDER — POLYETHYLENE GLYCOL 3350 17 G PO PACK: 17.0000 g | PACK | Freq: Every day | ORAL | Status: DC | PRN

## 2014-08-15 MED ORDER — BUPIVACAINE HCL (PF) 0.25 % IJ SOLN
INTRAMUSCULAR | Status: DC | PRN
  Administered 2014-08-15: 20 mL

## 2014-08-15 MED ORDER — TRANEXAMIC ACID 100 MG/ML IV SOLN
1000.0000 mg | INTRAVENOUS | Status: AC
  Administered 2014-08-15: 1000 mg via INTRAVENOUS
  Administered 2014-08-15: 18:00:00 via INTRAVENOUS
  Filled 2014-08-15: qty 10

## 2014-08-15 MED ORDER — ONDANSETRON HCL 4 MG PO TABS
4.0000 mg | ORAL_TABLET | Freq: Four times a day (QID) | ORAL | Status: DC | PRN
Start: 2014-08-15 — End: 2014-08-17

## 2014-08-15 MED ORDER — SODIUM CHLORIDE 0.9 % IJ SOLN
INTRAMUSCULAR | Status: AC
  Filled 2014-08-15: qty 50

## 2014-08-15 MED ORDER — MIDAZOLAM HCL 2 MG/2ML IJ SOLN
INTRAMUSCULAR | Status: AC
  Filled 2014-08-15: qty 2

## 2014-08-15 MED ORDER — DOCUSATE SODIUM 100 MG PO CAPS
100.0000 mg | ORAL_CAPSULE | Freq: Two times a day (BID) | ORAL | Status: DC
  Administered 2014-08-15 – 2014-08-17 (×4): 100 mg via ORAL

## 2014-08-15 MED ORDER — PHENYLEPHRINE HCL 10 MG/ML IJ SOLN
INTRAMUSCULAR | Status: DC | PRN
  Administered 2014-08-15 (×2): 40 ug via INTRAVENOUS

## 2014-08-15 MED ORDER — GABAPENTIN 400 MG PO CAPS
800.0000 mg | ORAL_CAPSULE | Freq: Four times a day (QID) | ORAL | Status: DC
  Administered 2014-08-15 – 2014-08-17 (×6): 800 mg via ORAL
  Filled 2014-08-15 (×10): qty 2

## 2014-08-15 MED ORDER — MIDAZOLAM HCL 5 MG/5ML IJ SOLN
INTRAMUSCULAR | Status: DC | PRN
  Administered 2014-08-15: 2 mg via INTRAVENOUS

## 2014-08-15 MED ORDER — LIDOCAINE HCL (CARDIAC) 20 MG/ML IV SOLN
INTRAVENOUS | Status: DC | PRN
  Administered 2014-08-15: 60 mg via INTRAVENOUS

## 2014-08-15 MED ORDER — FLUOXETINE HCL 20 MG PO CAPS
40.0000 mg | ORAL_CAPSULE | Freq: Every day | ORAL | Status: DC
  Administered 2014-08-16 – 2014-08-17 (×2): 40 mg via ORAL
  Filled 2014-08-15 (×2): qty 2

## 2014-08-15 MED ORDER — SODIUM CHLORIDE 0.9 % IV SOLN: INTRAVENOUS | Status: DC

## 2014-08-15 MED ORDER — BUPIVACAINE LIPOSOME 1.3 % IJ SUSP
20.0000 mL | Freq: Once | INTRAMUSCULAR | Status: DC
  Filled 2014-08-15: qty 20

## 2014-08-15 MED ORDER — GLYCOPYRROLATE 0.2 MG/ML IJ SOLN
INTRAMUSCULAR | Status: DC | PRN
  Administered 2014-08-15: 0.3 mg via INTRAVENOUS

## 2014-08-15 MED ORDER — ONDANSETRON HCL 4 MG/2ML IJ SOLN
INTRAMUSCULAR | Status: DC | PRN
Start: 2014-08-15 — End: 2014-08-15
  Administered 2014-08-15: 4 mg via INTRAVENOUS

## 2014-08-15 MED ORDER — CHLORHEXIDINE GLUCONATE 4 % EX LIQD: 60.0000 mL | Freq: Once | CUTANEOUS | Status: DC

## 2014-08-15 MED ORDER — HYDROMORPHONE HCL PF 1 MG/ML IJ SOLN
0.5000 mg | INTRAMUSCULAR | Status: DC | PRN
  Administered 2014-08-15 – 2014-08-17 (×6): 1 mg via INTRAVENOUS
  Filled 2014-08-15 (×6): qty 1

## 2014-08-15 MED ORDER — BUPIVACAINE LIPOSOME 1.3 % IJ SUSP
INTRAMUSCULAR | Status: DC | PRN
  Administered 2014-08-15: 20 mL

## 2014-08-15 MED ORDER — HYDROMORPHONE HCL PF 1 MG/ML IJ SOLN: 0.2500 mg | INTRAMUSCULAR | Status: DC | PRN

## 2014-08-15 MED ORDER — SODIUM CHLORIDE 0.9 % IV SOLN
INTRAVENOUS | Status: DC
  Administered 2014-08-16 (×2): via INTRAVENOUS

## 2014-08-15 MED ORDER — ROCURONIUM BROMIDE 100 MG/10ML IV SOLN
INTRAVENOUS | Status: AC
  Filled 2014-08-15: qty 1

## 2014-08-15 MED ORDER — ONDANSETRON HCL 4 MG/2ML IJ SOLN
INTRAMUSCULAR | Status: AC
  Filled 2014-08-15: qty 2

## 2014-08-15 MED ORDER — LIDOCAINE HCL (CARDIAC) 20 MG/ML IV SOLN
INTRAVENOUS | Status: AC
  Filled 2014-08-15: qty 5

## 2014-08-15 MED ORDER — CEFAZOLIN SODIUM-DEXTROSE 2-3 GM-% IV SOLR
INTRAVENOUS | Status: AC
  Filled 2014-08-15: qty 50

## 2014-08-15 MED ORDER — BUPIVACAINE HCL (PF) 0.25 % IJ SOLN
INTRAMUSCULAR | Status: AC
  Filled 2014-08-15: qty 30

## 2014-08-15 MED ORDER — CEFAZOLIN SODIUM 1-5 GM-% IV SOLN
1.0000 g | Freq: Four times a day (QID) | INTRAVENOUS | Status: AC
  Administered 2014-08-15 – 2014-08-16 (×2): 1 g via INTRAVENOUS
  Filled 2014-08-15 (×2): qty 50

## 2014-08-15 MED ORDER — CEFAZOLIN SODIUM-DEXTROSE 2-3 GM-% IV SOLR
2.0000 g | INTRAVENOUS | Status: AC
  Administered 2014-08-15: 2 g via INTRAVENOUS

## 2014-08-15 MED ORDER — PHENOL 1.4 % MT LIQD
1.0000 | OROMUCOSAL | Status: DC | PRN
  Filled 2014-08-15: qty 177

## 2014-08-15 MED ORDER — DEXAMETHASONE SODIUM PHOSPHATE 10 MG/ML IJ SOLN
INTRAMUSCULAR | Status: DC | PRN
  Administered 2014-08-15: 10 mg via INTRAVENOUS

## 2014-08-15 SURGICAL SUPPLY — 61 items
BAG ZIPLOCK 12X15 (MISCELLANEOUS) ×6 IMPLANT
BIT DRILL 2.8X128 (BIT) ×2 IMPLANT
BLADE EXTENDED COATED 6.5IN (ELECTRODE) ×2 IMPLANT
BLADE SAW SAG 73X25 THK (BLADE) ×1
BLADE SAW SGTL 73X25 THK (BLADE) ×1 IMPLANT
CABLE EXT 4H GTR W/4 23X232 (Cable) ×2 IMPLANT
CEMENT HV SMART SET (Cement) ×2 IMPLANT
DRAPE INCISE IOBAN 66X45 STRL (DRAPES) ×2 IMPLANT
DRAPE ORTHO SPLIT 77X108 STRL (DRAPES) ×2
DRAPE POUCH INSTRU U-SHP 10X18 (DRAPES) ×2 IMPLANT
DRAPE SURG ORHT 6 SPLT 77X108 (DRAPES) ×2 IMPLANT
DRAPE U-SHAPE 47X51 STRL (DRAPES) ×2 IMPLANT
DRSG ADAPTIC 3X8 NADH LF (GAUZE/BANDAGES/DRESSINGS) ×2 IMPLANT
DRSG EMULSION OIL 3X16 NADH (GAUZE/BANDAGES/DRESSINGS) ×2 IMPLANT
DRSG MEPILEX BORDER 4X4 (GAUZE/BANDAGES/DRESSINGS) ×4 IMPLANT
DRSG MEPILEX BORDER 4X8 (GAUZE/BANDAGES/DRESSINGS) ×4 IMPLANT
DURAPREP 26ML APPLICATOR (WOUND CARE) ×2 IMPLANT
ELECT REM PT RETURN 9FT ADLT (ELECTROSURGICAL) ×2
ELECTRODE REM PT RTRN 9FT ADLT (ELECTROSURGICAL) ×1 IMPLANT
EVACUATOR 1/8 PVC DRAIN (DRAIN) ×2 IMPLANT
FACESHIELD WRAPAROUND (MASK) ×8 IMPLANT
GAUZE SPONGE 4X4 12PLY STRL (GAUZE/BANDAGES/DRESSINGS) ×2 IMPLANT
GLOVE BIO SURGEON STRL SZ7.5 (GLOVE) ×2 IMPLANT
GLOVE BIO SURGEON STRL SZ8 (GLOVE) ×2 IMPLANT
GLOVE BIOGEL PI IND STRL 8 (GLOVE) ×2 IMPLANT
GLOVE BIOGEL PI INDICATOR 8 (GLOVE) ×2
GLOVE SURG SS PI 6.5 STRL IVOR (GLOVE) ×4 IMPLANT
GOWN STRL REUS W/TWL LRG LVL3 (GOWN DISPOSABLE) ×4 IMPLANT
GOWN STRL REUS W/TWL XL LVL3 (GOWN DISPOSABLE) ×2 IMPLANT
GUIDEWIRE BALL NOSE 80CM (WIRE) ×2 IMPLANT
HEAD FEM SROM 28 +6 (Hips) ×2 IMPLANT
IMMOBILIZER KNEE 20 (SOFTGOODS)
IMMOBILIZER KNEE 20 THIGH 36 (SOFTGOODS) IMPLANT
INSERT REST ADM X3 28 28/52 (Orthopedic Implant) ×2 IMPLANT
KIT BASIN OR (CUSTOM PROCEDURE TRAY) ×2 IMPLANT
MANIFOLD NEPTUNE II (INSTRUMENTS) ×2 IMPLANT
NDL SAFETY ECLIPSE 18X1.5 (NEEDLE) ×1 IMPLANT
NEEDLE HYPO 18GX1.5 SHARP (NEEDLE) ×1
NS IRRIG 1000ML POUR BTL (IV SOLUTION) ×2 IMPLANT
PACK TOTAL JOINT (CUSTOM PROCEDURE TRAY) ×2 IMPLANT
PADDING CAST COTTON 6X4 STRL (CAST SUPPLIES) ×2 IMPLANT
PASSER SUT SWANSON 36MM LOOP (INSTRUMENTS) ×2 IMPLANT
POSITIONER SURGICAL ARM (MISCELLANEOUS) ×2 IMPLANT
SPONGE LAP 18X18 X RAY DECT (DISPOSABLE) ×2 IMPLANT
SROM SLEEVE 20B SML (Hips) ×2 IMPLANT
STAPLER VISISTAT 35W (STAPLE) ×2 IMPLANT
STEM FEMORAL HIP 20X15X270 (Stem) ×2 IMPLANT
SUCTION FRAZIER TIP 10 FR DISP (SUCTIONS) ×2 IMPLANT
SUT ETHIBOND NAB CT1 #1 30IN (SUTURE) ×4 IMPLANT
SUT VIC AB 1 CT1 27 (SUTURE) ×3
SUT VIC AB 1 CT1 27XBRD ANTBC (SUTURE) ×3 IMPLANT
SUT VIC AB 2-0 CT1 27 (SUTURE) ×3
SUT VIC AB 2-0 CT1 TAPERPNT 27 (SUTURE) ×3 IMPLANT
SUT VLOC 180 0 24IN GS25 (SUTURE) ×4 IMPLANT
SWAB COLLECTION DEVICE MRSA (MISCELLANEOUS) ×2 IMPLANT
SYRINGE 60CC LL (MISCELLANEOUS) IMPLANT
TISSUE GRFT FEM PROXIMAL LEFT (Bone Implant) ×2 IMPLANT
TOWEL OR 17X26 10 PK STRL BLUE (TOWEL DISPOSABLE) ×4 IMPLANT
TRAY FOLEY CATH 14FRSI W/METER (CATHETERS) ×2 IMPLANT
TUBE ANAEROBIC SPECIMEN COL (MISCELLANEOUS) IMPLANT
WATER STERILE IRR 1500ML POUR (IV SOLUTION) ×2 IMPLANT

## 2014-08-15 NOTE — Interval H&P Note (Signed)
History and Physical Interval Note:  08/15/2014 5:30 PM  [name]  has presented today for surgery, with the diagnosis of Failed Left Total Hip Arthroplasty  The various methods of treatment have been discussed with the patient and family. After consideration of risks, benefits and other options for treatment, the patient has consented to  Procedure(s): LEFT TOTAL HIP ARTHROPLASTY REVISION WITH PROXIMAL FEMORAL ALLOGRAFT (Left) as a surgical intervention .  The patient's history has been reviewed, patient examined, no change in status, stable for surgery.  I have reviewed the patient's chart and labs.  Questions were answered to the patient's satisfaction.     Loanne Drilling

## 2014-08-15 NOTE — Progress Notes (Signed)
Patient and family made aware that surgery is delayed by one hour. They verbalize understanding.

## 2014-08-15 NOTE — Transfer of Care (Signed)
Immediate Anesthesia Transfer of Care Note  Patient: [name]  Procedure(s) Performed: Procedure(s): LEFT TOTAL HIP ARTHROPLASTY REVISION WITH PROXIMAL FEMORAL ALLOGRAFT (Left)  Patient Location: PACU  Anesthesia Type:General  Level of Consciousness: awake, alert  and oriented  Airway & Oxygen Therapy: Patient Spontanous Breathing and Patient connected to face mask oxygen  Post-op Assessment: Report given to PACU RN and Post -op Vital signs reviewed and stable  Post vital signs: Reviewed and stable  Complications: No apparent anesthesia complications

## 2014-08-15 NOTE — Brief Op Note (Signed)
08/15/2014  8:50 PM  PATIENT:  [name]  63 y.o. female  PRE-OPERATIVE DIAGNOSIS:  Failed Left Total Hip Arthroplasty  POST-OPERATIVE DIAGNOSIS:  Failed Left Total Hip Arthroplasty  PROCEDURE:  Procedure(s): LEFT TOTAL HIP ARTHROPLASTY REVISION WITH PROXIMAL FEMORAL ALLOGRAFT (Left)  SURGEON:  Surgeon(s) and Role:    * Loanne Drilling, MD - Primary  PHYSICIAN ASSISTANT:   ASSISTANTS: Avel Peace, PA-C   ANESTHESIA:   general  EBL:  Total I/O In: 2000 [I.V.:2000] Out: 150 [Blood:150]  BLOOD ADMINISTERED:none  DRAINS: (Medium) Hemovact drain(s) in the left hip with  Suction Open   LOCAL MEDICATIONS USED:  OTHER Exparel  COUNTS:  YES  TOURNIQUET:  * No tourniquets in log *  DICTATION: .Other Dictation: Dictation Number [id number]  PLAN OF CARE: Admit to inpatient   PATIENT DISPOSITION:  PACU - hemodynamically stable.

## 2014-08-15 NOTE — H&P (View-Only) (Signed)
[name] DOB: 11/29/1951 Married / Language: English / Race: White Female Date of Admission:  08-15-2014 Chief Complaint:  Failed Left Total Hip Arthroplasty History of Present Illness The patient is a 62 year old female who comes in for a preoperative History and Physical. The patient is scheduled for a left left tota hip revision with proximal femoral allograft to be performed by Dr. Frank V. Aluisio, MD at [facility] on 08/15/2014 . The patient is a 62 year old female presenting for a post-operative visit. The patient comes in several months out from left total hip arthroplasty (revision). The patient states that she is doing poorly at this time. The pain is under poor control at this time and describes their pain as moderate to severe. They are currently on Oxycodone for their pain. The patient is currently taking 3 pill(s) per day. Alvis and [name] both are very frustrated with her lack of progress. She is at a stage where she said she will basically do anything she can to try and fix this as she can't continue to go on with her leg in this condition. She had also had a lot of knee pain and had the knee MRI which showed a pretty significant stress reaction/stress fracture in the proximal medial tibia. She also had some old degenerative change in the knee as well as old ACL reconstruction that may potentially be torn now. The knee is the least of our concerns now. She is really focusing on the hip and wanting to do something because the shortening in her leg is really causing her a lot of difficulty as is the pain. Unfortunately that proximal bone has basically collapsed and the stem has migrated distally. The only thing that could potentially could salvage her at this point would be reconstruction with her proximal femoral allograft. The problem is that her proximal bone all the way down to just below the isthmus is basically paper thin and couldn't handle any prosthesis that would be  large enough in diameter to get purchased distally. The proximal femoral allograft would reconstruct the bone proximally and allow for the fixation distally. I went over this in detail with her and [name] including procedural risks, potential complications and they want to proceed in that direction. With regards to the knee, she just needs to be off the leg as much as possible to allow that stress reaction to heal. She says she is already starting to feel a little better in the knee. We will set her up for surgical treatment for the massive revision. She is ready to proceed with the revision procedure. Risks and benefits of the surgery have been discussed with the patient and they elect to proceed with surgery.  There are on active contraindications to upcoming procedure such as ongoing infection or progressive neurological disease.   Allergies  Morphine Derivatives Itching. Dose Dependent Zithromax Z-Pak *MACROLIDES* Itching. Skin peeling  Intolerances Augmentin *PENICILLINS* Nausea, Vomiting.  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 back surgery (11 hours) Degenerative Disc Disease Diverticulitis Of Colon Hiatal Hernia Anxiety Disorder Osteoporosis Measles Mumps Pneumonia Past History Depression   Family History  Mother Deceased, Postmenopausal Osteoporosis. Age 81, peforated colon. Father Deceased, Chronic Renal Failure Syndrome, Colon Cancer, Heart disease. Age 84, Was on dialysis. Bladder Cancer. Osteoarthritis Mother. mother Osteoporosis mother  Social History   Advance Directives Living Will and Healthcare POA Number of flights of stairs before winded 2-3 Tobacco use Current every day smoker. current every day smoker; smoke(d) 1 pack(s)  per day Children 1 Alcohol use Occasional alcohol use. current drinker; drinks wine; only occasionally per week Current work status working part time Marital status married Exercise Exercises never Living situation live with spouse Post-Surgical Plans Plan to go Home.  Medication History Oxycodone-Acetaminophen (10-325MG Tablet, Oral) Active. OxyCODONE HCl (5MG Tablet, Oral) Active. KlonoPIN (0.5MG Tablet, Oral) Active. ClonazePAM (0.5MG Tablet, Oral) Active. ALPRAZolam (1MG Tablet, Oral) Active. Neurontin (Oral) Specific dose unknown - Active. PROzac (20MG Capsule, Oral) Active.  Past Surgical History Breast Mass; Local Excision left Arthroscopy of Knee right Tonsillectomy Total Hip Replacement bilateral Carpal Tunnel Repair bilateral Spinal Fusion lower back Spinal Surgery S/P irrigation and debridement right hip S/P revision of total hip (V43.64  Z96.60) left  Review of Systems General Present- Night Sweats. Not Present- Appetite Loss, Chills, Fatigue, Feeling sick, Fever, Weight Gain and Weight Loss. Skin Not Present- Change in Hair or Nails, Itching, Psoriasis, Rash, Skin Color Changes and Ulcer. HEENT Present- Ringing in the Ears. Not Present- Hearing problems, Nose Bleed and Sensitivity to light. Neck Not Present- Neck Mass and Swollen Glands. Respiratory Not Present- Bloody sputum, Chronic Cough, Dyspnea and Snoring. Cardiovascular Not Present- Chest Pain, Leg Cramps, Palpitations, Shortness of Breath and Swelling of Extremities. Gastrointestinal Not Present- Abdominal Pain, Bloody Stool, Heartburn, Incontinence of Stool, Nausea and Vomiting. Female Genitourinary Not Present- Blood in Urine, Frequency, Incontinence, Menstrual Irregularities and Nocturia. Musculoskeletal Present- Back Pain, Joint Pain and Spasms. Not Present- Joint Stiffness, Joint Swelling, Muscle Pain and Muscle Weakness. Neurological Not Present- Burning, Dizziness, Headaches,  Numbness, Tingling and Tremor. Psychiatric Not Present- Anxiety, Depression and Memory Loss. Endocrine Not Present- Cold Intolerance, Excessive hunger, Excessive Thirst and Heat Intolerance. Hematology Not Present- Abnormal Bleeding, Anemia, Blood Clots and Easy Bruising.   Vitals Weight: 110 lb Height: 60in Body Surface Area: 1.45 m Body Mass Index: 21.48 kg/m BP: 122/66 (Sitting, Right Arm, Standard)   Physical Exam (Alezandrew L. Perkins III PA-C; 08/02/2014 2:50 PM) The physical exam findings are as follows: Note:Patient is a 62 year old white, petite framed female with continued right hip problems. Patient is accompanied today by her husband.  General Mental Status -Alert, cooperative and good historian. General Appearance-pleasant, Not in acute distress. Orientation-Oriented X3. Build & Nutrition-Lean, Petite, Well nourished and Well developed.  Head and Neck Head-normocephalic, atraumatic . Neck Global Assessment - supple, no bruit auscultated on the right, no bruit auscultated on the left.  Eye Pupil - Bilateral-Regular and Round. Motion - Bilateral-EOMI.  Chest and Lung Exam Auscultation Breath sounds - clear at anterior chest wall and clear at posterior chest wall. Adventitious sounds - No Adventitious sounds.  Cardiovascular Auscultation Rhythm - Regular rate and rhythm. Heart Sounds - S1 WNL and S2 WNL. Murmurs & Other Heart Sounds: Murmur 1 - Location - Aortic Area and Sternal Border - Left. Timing - Holosystolic. Grade - III/VI.  Abdomen Palpation/Percussion Palpation and Percussion of the abdomen reveal - Soft and Non Tender. Note: Flat. Rigidity (guarding) - Abdomen is soft. Auscultation Auscultation of the abdomen reveals - Bowel sounds normal.  Female Genitourinary Note: Not done, not pertinent to present illness   Musculoskeletal Note: She is alert and oriented in no apparent distress. She has pain on any attempted motion  of her hip. She is about 2 inches short now. She has a lot of discomfort   in her left knee. There is a trace effusion. She is tender along the medial aspect of the knee. There is no lateral tenderness or any instability.  Radiographs: AP and lateral of the hip and femur and there was no further migration of that stem. Her proximal bone is essentially gone except for the greater trochanter.   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 Aluisio.  Plan is to go home.  PCP - Dr. Morgan  The patient does not have any contraindications and will receive TXA (tranexamic acid) prior to surgery.  Signed electronically by Alexzandrew L Perkins, III PA-C 

## 2014-08-15 NOTE — Anesthesia Preprocedure Evaluation (Addendum)
Anesthesia Evaluation  Patient identified by MRN, date of birth, ID band Patient awake    Reviewed: Allergy & Precautions, H&P , NPO status , Patient's Chart, lab work & pertinent test results  Airway Mallampati: II TM Distance: >3 FB Neck ROM: Full    Dental no notable dental hx.    Pulmonary pneumonia -, resolved, Current Smoker,  breath sounds clear to auscultation  Pulmonary exam normal       Cardiovascular negative cardio ROS  Rhythm:Regular Rate:Normal     Neuro/Psych PSYCHIATRIC DISORDERS Anxiety Depression  Neuromuscular disease    GI/Hepatic hiatal hernia, GERD-  ,(+) Hepatitis -  Endo/Other  negative endocrine ROS  Renal/GU negative Renal ROS  negative genitourinary   Musculoskeletal negative musculoskeletal ROS (+)   Abdominal   Peds negative pediatric ROS (+)  Hematology  (+) anemia ,   Anesthesia Other Findings   Reproductive/Obstetrics negative OB ROS                         Anesthesia Physical Anesthesia Plan  ASA: III  Anesthesia Plan: General   Post-op Pain Management:    Induction: Intravenous  Airway Management Planned: Oral ETT  Additional Equipment:   Intra-op Plan:   Post-operative Plan: Extubation in OR  Informed Consent: I have reviewed the patients History and Physical, chart, labs and discussed the procedure including the risks, benefits and alternatives for the proposed anesthesia with the patient or authorized representative who has indicated his/her understanding and acceptance.   Dental advisory given  Plan Discussed with: CRNA  Anesthesia Plan Comments:         Anesthesia Quick Evaluation

## 2014-08-16 LAB — BASIC METABOLIC PANEL
Anion gap: 10 (ref 5–15)
BUN: 11 mg/dL (ref 6–23)
CO2: 24 mEq/L (ref 19–32)
Calcium: 8.5 mg/dL (ref 8.4–10.5)
Chloride: 100 mEq/L (ref 96–112)
Creatinine, Ser: 0.46 mg/dL — ABNORMAL LOW (ref 0.50–1.10)
GFR calc Af Amer: >90 mL/min (ref >90)
GFR calc non Af Amer: >90 mL/min (ref >90)
GLUCOSE: 153 mg/dL — AB (ref 70–99)
POTASSIUM: 4.4 meq/L (ref 3.7–5.3)
SODIUM: 134 meq/L — AB (ref 137–147)

## 2014-08-16 LAB — CBC
HCT: 37.6 % (ref 36.0–46.0)
HEMOGLOBIN: 11.4 g/dL — AB (ref 12.0–15.0)
MCH: 26 pg (ref 26.0–34.0)
MCHC: 30.3 g/dL (ref 30.0–36.0)
MCV: 85.6 fL (ref 78.0–100.0)
Platelets: 331 10*3/uL (ref 150–400)
RBC: 4.39 MIL/uL (ref 3.87–5.11)
RDW: 15.3 % (ref 11.5–15.5)
WBC: 11.6 10*3/uL — ABNORMAL HIGH (ref 4.0–10.5)

## 2014-08-16 NOTE — Anesthesia Postprocedure Evaluation (Signed)
  Anesthesia Post-op Note  Patient: [name]  Procedure(s) Performed: Procedure(s) (LRB): LEFT TOTAL HIP ARTHROPLASTY REVISION WITH PROXIMAL FEMORAL ALLOGRAFT (Left)  Patient Location: PACU  Anesthesia Type: General  Level of Consciousness: awake and alert   Airway and Oxygen Therapy: Patient Spontanous Breathing  Post-op Pain: mild  Post-op Assessment: Post-op Vital signs reviewed, Patient's Cardiovascular Status Stable, Respiratory Function Stable, Patent Airway and No signs of Nausea or vomiting  Last Vitals:  Filed Vitals:   08/16/14 0615  BP: 99/60  Pulse: 58  Temp: 37.1 C  Resp: 16    Post-op Vital Signs: stable   Complications: No apparent anesthesia complications

## 2014-08-16 NOTE — Plan of Care (Signed)
Problem: Consults Goal: Diagnosis- Total Joint Replacement Outcome: Completed/Met Date Met:  08/16/14 Revision Total Hip RIGHT

## 2014-08-16 NOTE — Evaluation (Addendum)
Occupational Therapy Evaluation Patient Details Name: [name] MRN: [id number] DOB: 05-12-1951 Today's Date: 08/16/2014    History of Present Illness Pt is a 63 year old female s/p left total hip arthroplasty revision with proximal femoral allograft with hx of L and R THA with multiple dislocations.   Clinical Impression   Pt was admitted for the above surgery.  She will benefit from skilled OT for reinforcement of THPs during toileting and ADLs.  Pt had assistance with LB adls prior to admission.  Goals are set for supervision level.    Follow Up Recommendations  Supervision 24/7.  HHOT    Equipment Recommendations  None recommended by OT    Recommendations for Other Services       Precautions / Restrictions Precautions Precautions: Fall;Posterior Hip Precaution Comments: reviewed posterior hip precautions Restrictions Other Position/Activity Restrictions: WBAT      Mobility Bed Mobility Overal bed mobility: Needs Assistance Bed Mobility: Supine to Sit     Supine to sit: Min assist     General bed mobility comments: assist for LLE. cues for THPs (internal rotation)  Transfers Overall transfer level: Needs assistance Equipment used: Rolling walker (2 wheeled) Transfers: Sit to/from Stand Sit to Stand: Min guard         General transfer comment: vcs for UE/LE placement    Balance                                            ADL Overall ADL's : Needs assistance/impaired     Grooming: Supervision/safety;Wash/dry face;Standing   Upper Body Bathing: Supervision/ safety;Sitting   Lower Body Bathing: Minimal assistance;Sit to/from stand;With adaptive equipment   Upper Body Dressing : Supervision/safety;Sitting   Lower Body Dressing: Minimal assistance;With adaptive equipment;Sit to/from stand   Toilet Transfer: Min guard;Ambulation;BSC   Toileting- Water quality scientist and Hygiene: Min guard;Sit to/from stand         General  ADL Comments: Pt ambulated to bathroom and stood at sink to wash her hair:  occasional min A given and cues for safety.  Pt needs cues for internal rotation as she tended to rotate body.  Safety cues also given. Pt has AE but husband has been assisting with ADLs     Vision                     Perception     Praxis      Pertinent Vitals/Pain Pain Score: 8  Pain Location: L hip Pain Descriptors / Indicators: Aching Pain Intervention(s): Premedicated before session;Limited activity within patient's tolerance;Monitored during session;Ice applied;Repositioned     Hand Dominance Right   Extremity/Trunk Assessment Upper Extremity Assessment Upper Extremity Assessment: Overall WFL for tasks assessed           Communication Communication Communication: No difficulties   Cognition Arousal/Alertness: Awake/alert Behavior During Therapy: WFL for tasks assessed/performed Overall Cognitive Status: Within Functional Limits for tasks assessed (mostly:  cues for safety; cues that she had catheter--forgot)                     General Comments       Exercises       Shoulder Instructions      Home Living Family/patient expects to be discharged to:: Private residence Living Arrangements: Spouse/significant other Available Help at Discharge: Family Type of Home: House Home Access: Stairs  to enter Entrance Stairs-Number of Steps: 3 Entrance Stairs-Rails: Right Home Layout: One level     Bathroom Shower/Tub: Teacher, early years/pre: Handicapped height     Home Equipment: Bedside commode;Crutches;Cane - single point;Wheelchair - manual;Shower seat;Grab bars - tub/shower;Grab bars - toilet;Adaptive equipment;Hand held shower head;Hospital bed;Walker - 2 wheels Adaptive Equipment: Reacher;Sock aid;Long-handled shoe horn;Long-handled sponge        Prior Functioning/Environment Level of Independence: Needs assistance        Comments: has AE kit but  husband assisted with ADLs    OT Diagnosis: Generalized weakness   OT Problem List: Decreased strength;Decreased activity tolerance;Decreased knowledge of precautions;Decreased knowledge of use of DME or AE;Pain   OT Treatment/Interventions: Self-care/ADL training;DME and/or AE instruction;Patient/family education    OT Goals(Current goals can be found in the care plan section) Acute Rehab OT Goals Patient Stated Goal: wash my hair OT Goal Formulation: With patient Time For Goal Achievement: 08/23/14 Potential to Achieve Goals: Good ADL Goals Pt Will Transfer to Toilet: with supervision;ambulating;bedside commode Pt Will Perform Toileting - Clothing Manipulation and hygiene: with supervision;sit to/from stand Additional ADL Goal #1: Pt will not need any cues for THPs during OT session  OT Frequency: Min 2X/week   Barriers to D/C:            Co-evaluation              End of Session    Activity Tolerance: Patient tolerated treatment well Patient left: in bed;with call bell/phone within reach   Time: 1330-1408 OT Time Calculation (min): 38 min Charges:  OT General Charges $OT Visit: 1 Procedure OT Evaluation $Initial OT Evaluation Tier I: 1 Procedure OT Treatments $Self Care/Home Management : 23-37 mins G-Codes:    Mireyah Chervenak 2014-08-27, 3:22 PM  Lesle Chris, OTR/L [phone] 27-Aug-2014

## 2014-08-16 NOTE — Op Note (Signed)
[name], [name]                 ACCOUNT NO.:  [id number]  MEDICAL RECORD NO.:  [id number]  LOCATION:  [facility]                         FACILITY:  [facility]  PHYSICIAN:  Ollen Gross, M.D.    DATE OF BIRTH:  March 26, 1951  DATE OF PROCEDURE:  08/15/2014 DATE OF DISCHARGE:                              OPERATIVE REPORT   PREOPERATIVE DIAGNOSIS:  Failed left total hip arthroplasty.  POSTOPERATIVE DIAGNOSIS:  Failed left total hip arthroplasty.  PROCEDURE:  Left total hip arthroplasty revision with proximal femoral allograft.  SURGEON:  Ollen Gross, M.D.  ASSISTANT:  Alexzandrew L. Perkins, PA-C.  ANESTHESIA:  General.  ESTIMATED BLOOD LOSS:  300.  DRAINS:  Hemovac x1.  COMPLICATIONS:  None.  CONDITION:  Stable to recovery.  BRIEF CLINICAL NOTE:  Ms. [name] is a 63 year old female who had a very long complex history in regards to her hips.  She most recently had a 2- stage revision of her left hip for infection.  She had placement of a long fully coated stem, and unfortunately, this stem subsided substantially and was loose.  She has had progressively worsening pain and dysfunction.  She presents now for revision total hip arthroplasty with proximal femoral grafting.  PROCEDURE IN DETAIL:  After successful administration of general anesthetic, the patient was placed in the right lateral decubitus position with the left side up and held with the hip positioner.  The left lower extremity was isolated from perineum with plastic drapes and prepped and draped in the usual sterile fashion.  A long posterolateral incision was made with a 10 blade through the subcutaneous tissue to the fascia lata which incised in line with the skin incision.  Sciatic nerve was palpated and protected.  Plate was identified on the lateral cortex of the femur.  Cables were cut and plates removed.  Stem was grossly loose.  She essentially had no lateral cortex of the proximal third of her femur left at  all.  We dislocated the hip and then removed the stem. What was left intact was the MDM acetabular shell with the metal liner. This was in excellent position, was well fixed.  Her bone defect once the stem was removed was lost the entire lateral cortex of the proximal femur and malunion of the greater trochanter.  I did a trochanteric osteotomy, so I could mobilize the trochanters for reattachment later. The bone quality left proximal and medial was substandard and I ended up subperiosteally elevating the muscle off this paper thin bone and removing it.  We then fashioned a proximal femoral allograft to do a step-cut long, lateral, and shorter medial, so it would piece into the bone as a puzzle.  The measurements were made for this graft.  I then fashioned the graft by resecting at the appropriate length and then removing the distal medial portion before the step-cut.  We then made the femoral neck cut at the appropriate level.  I reamed up to 16.5 mm. Into her native bone, we also reamed it to 16.5 mm with placement of 15- mm long SROM stem.  The allograft was then prepared with the proximal reamers up to an 18D and the sleeve machine  between the small and large. I went up on size, so we could have a cement mantle for the sleeve.  A trial 18B small sleeve was placed with an 18 x 13 extra long stem with a 36+ 8 neck.  We made some final adjustments to her native bone and to the grafts, so the step-cut would fit appropriately.  We placed the trial head and neck on to the stem and reduced it.  With this reduction, her leg length is restored to equal.  She is about an inch and a half short from the subsidence of the previous stem.  The hip was placed through range of motion and is very stable throughout.  I was able to mobilize the greater trochanter, so it would reattach to the graft once we completed that.  We then thoroughly irrigated the wound bed with a liter of saline solution.  We  then mixed a batch of cement and cemented the 18B small sleeve into the graft.  The stem was then passed through the graft which was an 18 x 13 extra long stem for a left femur and we put it in anteversion which would to be approximately 20 degrees.  This was impacted and we had a fantastic fit with the graft onto her native bone and distally into her native bone, we had great rotational stability. We then fashioned the trochanteric bed on the graft so as to allow for advancements of her greater trochanter onto the graft and then, secure fixation with a Zimmer long trochanteric plate.  The permanent 28+6 SROM femoral head was placed with the 46 mm polyethylene MDM Stryker ball. She had phenomenal stability throughout range of motion of the hip. Once again, her leg lengths were equalized.  We then advanced the trochanter down to the graft and secured this placing a cable around the trochanter as well as 3 more cables around the graft, around the graft- host junction and around the host bone to make this a solitary unit. Further irrigation was performed.  The fascia of the vastus lateralis was closed with a running #1 V-Loc as was the fascia lata.  Hemovac drain was placed prior to that.  A 20 mL of Exparel mixed with 40 mL of saline was injected into the muscle and the subcu tissues.  Additional 20 mL of 0.25% Marcaine was injected into the same tissues.  Subcu was then closed with interrupted 2-0 Vicryl and skin with staples.  Drains hooked to suction.  Incision cleaned and dried and a bulky sterile dressing applied.  She was then placed into a knee immobilizer, awakened, and transported to recovery in stable condition.  Please note that a surgical assistant was a medical necessity for this procedure, throughout the entire procedure both for removal of the old prosthesis as well as for fashioning of the allograft and placement of the new prosthesis and for protection of vital  neurovascular structures during this procedure and also providing appropriate exposure for proper placement of the prosthesis.     Ollen Gross, M.D.     FA/MEDQ  D:  08/15/2014  T:  08/15/2014  Job:  [id number]
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
## 2014-08-16 NOTE — Evaluation (Signed)
Physical Therapy Evaluation Patient Details Name: [name] MRN: [id number] DOB: October 28, 1951 Today's Date: 08/16/2014   History of Present Illness  Pt is a 63 year old female s/p left total hip arthroplasty revision with proximal femoral allograft with hx of L and R THA with multiple dislocations.   Clinical Impression  Pt is s/p L THA resulting in the deficits listed below (see PT Problem List).  Pt will benefit from skilled PT to increase their independence and safety with mobility to allow discharge to the venue listed below.  Pt unable to recall all hip precautions so reviewed verbally and with mobility.  Pt plans to d/c home with family assist.        Follow Up Recommendations Home health PT    Equipment Recommendations  None recommended by PT    Recommendations for Other Services       Precautions / Restrictions Precautions Precautions: Fall;Posterior Hip Precaution Comments: reviewed posterior hip precautions Restrictions Other Position/Activity Restrictions: WBAT      Mobility  Bed Mobility Overal bed mobility: Needs Assistance Bed Mobility: Supine to Sit     Supine to sit: Min assist     General bed mobility comments: verbal cues for hip precautions, assist for L LE  Transfers Overall transfer level: Needs assistance Equipment used: Rolling walker (2 wheeled) Transfers: Sit to/from Stand Sit to Stand: Min guard         General transfer comment: verbal cues for technique within precautions  Ambulation/Gait Ambulation/Gait assistance: Min guard Ambulation Distance (Feet): 40 Feet Assistive device: Rolling walker (2 wheeled) Gait Pattern/deviations: Step-to pattern;Antalgic;Decreased stance time - left Gait velocity: decr   General Gait Details: verbal cues for sequence, step length, turning toward unaffected side, pt reports increased pain during ambulation so distance limited  Stairs            Wheelchair Mobility    Modified Rankin  (Stroke Patients Only)       Balance                                             Pertinent Vitals/Pain Pain Assessment: 0-10 Pain Score: 8  Pain Location: L hip pain Pain Descriptors / Indicators: Aching;Sore Pain Intervention(s): Premedicated before session;Repositioned;Patient requesting pain meds-RN notified;Limited activity within patient's tolerance    Home Living Family/patient expects to be discharged to:: Private residence Living Arrangements: Spouse/significant other Available Help at Discharge: Family Type of Home: House Home Access: Stairs to enter Entrance Stairs-Rails: Right Entrance Stairs-Number of Steps: 3 Home Layout: One level Home Equipment: Bedside commode;Crutches;Cane - single point;Wheelchair - manual;Shower seat;Grab bars - tub/shower;Grab bars - toilet;Adaptive equipment;Hand held shower head;Hospital bed;Walker - 2 wheels      Prior Function Level of Independence: Needs assistance   Gait / Transfers Assistance Needed: pt reports using w/c more prior to surgery due to pain and LLD           Hand Dominance        Extremity/Trunk Assessment               Lower Extremity Assessment: Generalized weakness;LLE deficits/detail   LLE Deficits / Details: poor functional hip strength observed     Communication   Communication: No difficulties  Cognition Arousal/Alertness: Awake/alert Behavior During Therapy: WFL for tasks assessed/performed Overall Cognitive Status: Within Functional Limits for tasks assessed  General Comments      Exercises        Assessment/Plan    PT Assessment Patient needs continued PT services  PT Diagnosis Abnormality of gait;Acute pain   PT Problem List Decreased strength;Decreased activity tolerance;Decreased mobility;Pain;Decreased knowledge of precautions  PT Treatment Interventions Functional mobility training;Stair training;Gait training;DME  instruction;Patient/family education;Therapeutic activities;Therapeutic exercise   PT Goals (Current goals can be found in the Care Plan section) Acute Rehab PT Goals PT Goal Formulation: With patient Time For Goal Achievement: 08/23/14 Potential to Achieve Goals: Good    Frequency 7X/week   Barriers to discharge        Co-evaluation               End of Session   Activity Tolerance: Patient limited by pain Patient left: in chair;with call bell/phone within reach Nurse Communication: Patient requests pain meds         Time: 4540-9811 PT Time Calculation (min): 17 min   Charges:   PT Evaluation $Initial PT Evaluation Tier I: 1 Procedure PT Treatments $Gait Training: 8-22 mins   PT G Codes:          Cherlynn Popiel,KATHrine E 08/16/2014, 12:29 PM Zenovia Jarred, PT, DPT 08/16/2014 Pager: [phone]

## 2014-08-16 NOTE — Progress Notes (Signed)
CARE MANAGEMENT NOTE 08/16/2014  Patient:  [name], [name]   Account Number:  [id number]  Date Initiated:  08/16/2014  Documentation initiated by:  Hamed Debella  Subjective/Objective Assessment:   total hip revision due to failed prior surgerical intervention     Action/Plan:   snf versus home with hhc   Anticipated DC Date:  08/19/2014   Anticipated DC Plan:  SKILLED NURSING FACILITY  In-house referral  Clinical Social Worker      DC Planning Services  CM consult      [facility] Choice  NA   Choice offered to / List presented to:  NA   DME arranged  NA      DME agency  NA     HH arranged  NA      HH agency  [facility]   Status of service:  In process, will continue to follow Medicare Important Message given?  NA - LOS <3 / Initial given by admissions (If response is "NO", the following Medicare IM given date fields will be blank) Date Medicare IM given:   Medicare IM given by:   Date Additional Medicare IM given:   Additional Medicare IM given by:    Discharge Disposition:    Per UR Regulation:  Reviewed for med. necessity/level of care/duration of stay  If discussed at Long Length of Stay Meetings, dates discussed:    Comments:  Bjorn Loser Tameria Patti,RN,BSN,CCM

## 2014-08-16 NOTE — Progress Notes (Signed)
Physical Therapy Treatment Note   08/16/14 1538  PT Visit Information  Last PT Received On 08/16/14  Assistance Needed +1  History of Present Illness Pt is a 63 year old female s/p left total hip arthroplasty revision with proximal femoral allograft with hx of L and R THA with multiple dislocations.  PT Time Calculation  PT Start Time 1503  PT Stop Time 1514  PT Time Calculation (min) 11 min  Subjective Data  Subjective Pt ambulated again in hallway and reviewed posterior hip precautions again as pt only able to recall 2/3.  Precautions  Precautions Fall;Posterior Hip  Precaution Comments reviewed posterior hip precautions  Restrictions  Other Position/Activity Restrictions WBAT  Pain Assessment  Pain Assessment 0-10  Pain Score 6  Pain Location L hip   Pain Descriptors / Indicators Aching;Sore  Pain Intervention(s) Repositioned;Limited activity within patient's tolerance;Ice applied  Cognition  Arousal/Alertness Awake/alert  Behavior During Therapy WFL for tasks assessed/performed  Overall Cognitive Status Within Functional Limits for tasks assessed  Bed Mobility  Overal bed mobility Needs Assistance  Bed Mobility Supine to Sit;Sit to Supine  Supine to sit Supervision  Sit to supine Supervision  General bed mobility comments pt self assisted L LE, cues for precautions  Transfers  Overall transfer level Needs assistance  Equipment used Rolling walker (2 wheeled)  Transfers Sit to/from Stand  Sit to Stand Min guard  General transfer comment verbal cues for precautions  Ambulation/Gait  Ambulation/Gait assistance Min guard  Ambulation Distance (Feet) 120 Feet  Assistive device Rolling walker (2 wheeled)  Gait Pattern/deviations Step-to pattern;Antalgic  Gait velocity decr  General Gait Details verbal cues for step length and using RW for UE support for pain control which pt reports better during ambulation this afternoon.  PT - End of Session  Activity Tolerance Patient  tolerated treatment well  Patient left in bed;with call bell/phone within reach  PT - Assessment/Plan  PT Plan Current plan remains appropriate  PT Frequency 7X/week  Follow Up Recommendations Home health PT  PT equipment None recommended by PT  PT Goal Progression  Progress towards PT goals Progressing toward goals  PT General Charges  $$ ACUTE PT VISIT 1 Procedure  PT Treatments  $Gait Training 8-22 mins   Zenovia Jarred, PT, DPT 08/16/2014 Pager: [phone]

## 2014-08-17 ENCOUNTER — Encounter (HOSPITAL_COMMUNITY): Payer: Self-pay | Admitting: Orthopedic Surgery

## 2014-08-17 LAB — BASIC METABOLIC PANEL
ANION GAP: 11 (ref 5–15)
BUN: 9 mg/dL (ref 6–23)
CALCIUM: 8.6 mg/dL (ref 8.4–10.5)
CO2: 26 meq/L (ref 19–32)
CREATININE: 0.48 mg/dL — AB (ref 0.50–1.10)
Chloride: 102 mEq/L (ref 96–112)
GFR calc Af Amer: >90 mL/min (ref >90)
Glucose, Bld: 109 mg/dL — ABNORMAL HIGH (ref 70–99)
Potassium: 3.8 mEq/L (ref 3.7–5.3)
SODIUM: 139 meq/L (ref 137–147)

## 2014-08-17 LAB — CBC
HCT: 32.2 % — ABNORMAL LOW (ref 36.0–46.0)
Hemoglobin: 10 g/dL — ABNORMAL LOW (ref 12.0–15.0)
MCH: 26.2 pg (ref 26.0–34.0)
MCHC: 31.1 g/dL (ref 30.0–36.0)
MCV: 84.3 fL (ref 78.0–100.0)
Platelets: 303 10*3/uL (ref 150–400)
RBC: 3.82 MIL/uL — AB (ref 3.87–5.11)
RDW: 15.7 % — ABNORMAL HIGH (ref 11.5–15.5)
WBC: 10.7 10*3/uL — ABNORMAL HIGH (ref 4.0–10.5)

## 2014-08-17 MED ORDER — OXYCODONE HCL 5 MG PO TABS: 5.0000 mg | ORAL_TABLET | ORAL | Status: DC | PRN

## 2014-08-17 MED ORDER — ASPIRIN 325 MG PO TBEC
325.0000 mg | DELAYED_RELEASE_TABLET | Freq: Every day | ORAL | Status: DC
Start: 2014-08-17 — End: 2014-09-17

## 2014-08-17 MED ORDER — METHOCARBAMOL 500 MG PO TABS: 500.0000 mg | ORAL_TABLET | Freq: Four times a day (QID) | ORAL | Status: DC | PRN
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
## 2014-08-17 NOTE — Plan of Care (Signed)
Problem: Phase III Progression Outcomes Goal: Anticoagulant follow-up in place Outcome: Not Applicable Date Met:  75/10/25 ASA BID, no f/u needed.

## 2014-08-17 NOTE — Discharge Instructions (Signed)
Dr. Gaynelle Arabian Total Joint Specialist [facility] [address], [place], [place] [address] [phone]   TOTAL HIP REPLACEMENT POSTOPERATIVE DIRECTIONS    Hip Rehabilitation, Guidelines Following Surgery  The results of a hip operation are greatly improved after range of motion and muscle strengthening exercises. Follow all safety measures which are given to protect your hip. If any of these exercises cause increased pain or swelling in your joint, decrease the amount until you are comfortable again. Then slowly increase the exercises. Call your caregiver if you have problems or questions.  HOME CARE INSTRUCTIONS  Most of the following instructions are designed to prevent the dislocation of your new hip.  Remove items at home which could result in a fall. This includes throw rugs or furniture in walking pathways.  Continue medications as instructed at time of discharge.  You may have some home medications which will be placed on hold until you complete the course of blood thinner medication.  You may start showering once you are discharged home but do not submerge the incision under water. Just pat the incision dry and apply a dry gauze dressing on daily. Do not put on socks or shoes without following the instructions of your caregivers.  Sit on high chairs so your hips are not bent more than 90 degrees.  Sit on chairs with arms. Use the chair arms to help push yourself up when arising.  Keep your leg on the side of the operation out in front of you when standing up.  Arrange for the use of a toilet seat elevator so you are not sitting low.  Do not do any exercises or get in any positions that cause your toes to point in (pigeon toed).  Always sleep with a pillow between your legs. Do not lie on your side in sleep with both knees touching the bed.   Walk with walker as instructed.  You may resume a sexual relationship in one month or when given the OK by  your caregiver.  Use walker as long as suggested by your caregivers.  You may put full weight on your legs and walk as much as is comfortable. Avoid periods of inactivity such as sitting longer than an hour when not asleep. This helps prevent blood clots.  You may return to work once you are cleared by Engineer, production.  Do not drive a car for 6 weeks or until released by your surgeon.  Do not drive while taking narcotics.  Wear elastic stockings for three weeks following surgery during the day but you may remove then at night.  Make sure you keep all of your appointments after your operation with all of your doctors and caregivers. You should call the office at the above phone number and make an appointment for approximately two weeks after the date of your surgery. Change the dressing daily and reapply a dry dressing each time. Please pick up a stool softener and laxative for home use as long as you are requiring pain medications.  Continue to use ice on the hip for pain and swelling from surgery. You may notice swelling that will progress down to the foot and ankle.  This is normal after  surgery.  Elevate the leg when you are not up walking on it.   It is important for you to complete the blood thinner medication as prescribed by your doctor.  Continue to use the breathing machine which will help keep your temperature down.  It  is common for your temperature to cycle up and down following surgery, especially at night when you are not up moving around and exerting yourself.  The breathing machine keeps your lungs expanded and your temperature down.  RANGE OF MOTION AND STRENGTHENING EXERCISES  These exercises are designed to help you keep full movement of your hip joint. Follow your caregiver's or physical therapist's instructions. Perform all exercises about fifteen times, three times per day or as directed. Exercise both hips, even if you have had only one joint replacement. These exercises can be  done on a training (exercise) mat, on the floor, on a table or on a bed. Use whatever works the best and is most comfortable for you. Use music or television while you are exercising so that the exercises are a pleasant break in your day. This will make your life better with the exercises acting as a break in routine you can look forward to.  Lying on your back, slowly slide your foot toward your buttocks, raising your knee up off the floor. Then slowly slide your foot back down until your leg is straight again.  Lying on your back spread your legs as far apart as you can without causing discomfort.  Lying on your side, raise your upper leg and foot straight up from the floor as far as is comfortable. Slowly lower the leg and repeat.  Lying on your back, tighten up the muscle in the front of your thigh (quadriceps muscles). You can do this by keeping your leg straight and trying to raise your heel off the floor. This helps strengthen the largest muscle supporting your knee.  Lying on your back, tighten up the muscles of your buttocks both with the legs straight and with the knee bent at a comfortable angle while keeping your heel on the floor.   SKILLED REHAB INSTRUCTIONS: If the patient is transferred to a skilled rehab facility following release from the hospital, a list of the current medications will be sent to the facility for the patient to continue.  When discharged from the skilled rehab facility, please have the facility set up the patient's Home Health Physical Therapy prior to being released. Also, the skilled facility will be responsible for providing the patient with their medications at time of release from the facility to include their pain medication, the muscle relaxants, and their blood thinner medication. If the patient is still at the rehab facility at time of the two week follow up appointment, the skilled rehab facility will also need to assist the patient in arranging follow up  appointment in our office and any transportation needs.  MAKE SURE YOU:  Understand these instructions.  Will watch your condition.  Will get help right away if you are not doing well or get worse.  Pick up stool softner and laxative for home. Do not submerge incision under water. May shower. Continue to use ice for pain and swelling from surgery. Hip precautions.  Total Hip Protocol.  Take a 325 mg enteric coated aspirin daily for four weeks.

## 2014-08-17 NOTE — Progress Notes (Signed)
Pt. Started bleeding from lower end of incision when up to BR.  Dressing was reinforced x 3 ( once by night nurse & twice on day shift) & mepilex was changed x1. Pt. was helped back to bed & 3 ice pks. applied over last reinforcement. Bleeding was not from old drain site, but was coming between 2 staples about 1.5" from lower end of incision. PA Avel Peace was notified.

## 2014-08-17 NOTE — Progress Notes (Signed)
Physical Therapy Treatment Patient Details Name: [name] MRN: [id number] DOB: [birth year] Today's Date: 08/17/2014    History of Present Illness Pt is a 63 year old female s/p left total hip arthroplasty revision with proximal femoral allograft with hx of L and R THA with multiple dislocations.    PT Comments    Pt assisted to bathroom prior to ambulating in hallway.  Occasional cues for maintaining precautions during all mobility.  Pt practiced safe stair technique and then ambulated back to room.  Pt provided with HEP handout per request (states she had a copy but misplaced it at home and would like new handout) and did not perform today however discussed performing easier exercises within pain tolerance such as ankle pumps, quad sets, and glut sets until HHPT can assist.  Pt to d/c home today.   Follow Up Recommendations  Home health PT     Equipment Recommendations  None recommended by PT    Recommendations for Other Services       Precautions / Restrictions Precautions Precautions: Fall;Posterior Hip Precaution Comments: reviewed posterior hip precautions Restrictions Weight Bearing Restrictions: No Other Position/Activity Restrictions: WBAT    Mobility  Bed Mobility Overal bed mobility: Needs Assistance Bed Mobility: Supine to Sit;Sit to Supine     Supine to sit: Min assist Sit to supine: Min assist   General bed mobility comments: assist for L LE today due to pain  Transfers Overall transfer level: Needs assistance Equipment used: Rolling walker (2 wheeled) Transfers: Sit to/from Stand Sit to Stand: Min guard         General transfer comment: verbal cues for precautions  Ambulation/Gait Ambulation/Gait assistance: Min guard Ambulation Distance (Feet): 200 Feet Assistive device: Rolling walker (2 wheeled) Gait Pattern/deviations: Step-to pattern;Antalgic Gait velocity: decr   General Gait Details: verbal cues for using RW for UE support for pain  control, turning toward unaffected LE   Stairs Stairs: Yes Stairs assistance: Min guard Stair Management: Step to pattern;Forwards;With crutches;One rail Right Number of Stairs: 2 General stair comments: verbal cues for sequence, pt reports she is able to perform well just not verbalize technique as she has been performed steps at home this way for months  Wheelchair Mobility    Modified Rankin (Stroke Patients Only)       Balance                                    Cognition Arousal/Alertness: Awake/alert Behavior During Therapy: WFL for tasks assessed/performed Overall Cognitive Status: Within Functional Limits for tasks assessed                      Exercises      General Comments        Pertinent Vitals/Pain Pain Assessment: 0-10 Pain Score: 7  Pain Location: L hip Pain Descriptors / Indicators: Aching;Sore Pain Intervention(s): Repositioned;Ice applied;RN gave pain meds during session    Home Living                      Prior Function            PT Goals (current goals can now be found in the care plan section) Progress towards PT goals: Progressing toward goals    Frequency  7X/week    PT Plan Current plan remains appropriate    Co-evaluation  End of Session   Activity Tolerance: Patient tolerated treatment well Patient left: in bed;with call bell/phone within reach     Time: 1029-1055 PT Time Calculation (min): 26 min  Charges:  $Gait Training: 23-37 mins                    G Codes:      Breea Loncar,KATHrine E 08/20/14, 12:05 PM Zenovia Jarred, PT, DPT 2014-08-20 Pager: [phone]
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
## 2014-08-18 LAB — TYPE AND SCREEN
ABO/RH(D): O POS
Antibody Screen: NEGATIVE
DONOR AG TYPE: NEGATIVE
Donor AG Type: NEGATIVE
UNIT DIVISION: 0
Unit division: 0

## 2014-09-02 DIAGNOSIS — S62102A Fracture of unspecified carpal bone, left wrist, initial encounter for closed fracture: Secondary | ICD-10-CM

## 2014-09-02 HISTORY — DX: Fracture of unspecified carpal bone, left wrist, initial encounter for closed fracture: S62.102A

## 2014-09-17 ENCOUNTER — Ambulatory Visit (HOSPITAL_COMMUNITY): Payer: BC Managed Care – PPO

## 2014-09-17 ENCOUNTER — Emergency Department (HOSPITAL_COMMUNITY): Payer: BC Managed Care – PPO

## 2014-09-17 ENCOUNTER — Encounter (HOSPITAL_COMMUNITY): Payer: Self-pay | Admitting: Emergency Medicine

## 2014-09-17 ENCOUNTER — Ambulatory Visit (HOSPITAL_COMMUNITY)
Admission: EM | Admit: 2014-09-17 | Discharge: 2014-09-17 | Disposition: A | Discharge status: Home / Self Care | Payer: BC Managed Care – PPO | Attending: Emergency Medicine | Admitting: Emergency Medicine

## 2014-09-17 ENCOUNTER — Encounter (HOSPITAL_COMMUNITY)
Admission: EM | Disposition: A | Discharge status: Home / Self Care | Payer: Self-pay | Source: Home / Self Care | Attending: Emergency Medicine

## 2014-09-17 ENCOUNTER — Encounter (HOSPITAL_COMMUNITY): Payer: BC Managed Care – PPO | Admitting: Anesthesiology

## 2014-09-17 ENCOUNTER — Emergency Department (HOSPITAL_COMMUNITY): Payer: BC Managed Care – PPO | Admitting: Anesthesiology

## 2014-09-17 DIAGNOSIS — S73005A Unspecified dislocation of left hip, initial encounter: Secondary | ICD-10-CM

## 2014-09-17 DIAGNOSIS — Z96649 Presence of unspecified artificial hip joint: Secondary | ICD-10-CM | POA: Diagnosis not present

## 2014-09-17 DIAGNOSIS — M810 Age-related osteoporosis without current pathological fracture: Secondary | ICD-10-CM | POA: Diagnosis not present

## 2014-09-17 DIAGNOSIS — X58XXXA Exposure to other specified factors, initial encounter: Secondary | ICD-10-CM | POA: Diagnosis not present

## 2014-09-17 DIAGNOSIS — F329 Major depressive disorder, single episode, unspecified: Secondary | ICD-10-CM | POA: Diagnosis not present

## 2014-09-17 DIAGNOSIS — K219 Gastro-esophageal reflux disease without esophagitis: Secondary | ICD-10-CM | POA: Insufficient documentation

## 2014-09-17 DIAGNOSIS — Z79899 Other long term (current) drug therapy: Secondary | ICD-10-CM | POA: Diagnosis not present

## 2014-09-17 DIAGNOSIS — Z885 Allergy status to narcotic agent status: Secondary | ICD-10-CM | POA: Insufficient documentation

## 2014-09-17 DIAGNOSIS — F3289 Other specified depressive episodes: Secondary | ICD-10-CM | POA: Insufficient documentation

## 2014-09-17 DIAGNOSIS — F17200 Nicotine dependence, unspecified, uncomplicated: Secondary | ICD-10-CM | POA: Insufficient documentation

## 2014-09-17 DIAGNOSIS — T84029A Dislocation of unspecified internal joint prosthesis, initial encounter: Secondary | ICD-10-CM | POA: Insufficient documentation

## 2014-09-17 DIAGNOSIS — Z880 Allergy status to penicillin: Secondary | ICD-10-CM | POA: Diagnosis not present

## 2014-09-17 DIAGNOSIS — Y929 Unspecified place or not applicable: Secondary | ICD-10-CM | POA: Diagnosis not present

## 2014-09-17 DIAGNOSIS — Z881 Allergy status to other antibiotic agents status: Secondary | ICD-10-CM | POA: Insufficient documentation

## 2014-09-17 DIAGNOSIS — F411 Generalized anxiety disorder: Secondary | ICD-10-CM | POA: Insufficient documentation

## 2014-09-17 HISTORY — PX: HIP CLOSED REDUCTION: SHX983

## 2014-09-17 LAB — COMPREHENSIVE METABOLIC PANEL
ALK PHOS: 162 U/L — AB (ref 39–117)
ALT: 14 U/L (ref 0–35)
AST: 19 U/L (ref 0–37)
Albumin: 2.8 g/dL — ABNORMAL LOW (ref 3.5–5.2)
Anion gap: 13 (ref 5–15)
BILIRUBIN TOTAL: 0.2 mg/dL — AB (ref 0.3–1.2)
BUN: 15 mg/dL (ref 6–23)
CHLORIDE: 103 meq/L (ref 96–112)
CO2: 24 meq/L (ref 19–32)
Calcium: 8.8 mg/dL (ref 8.4–10.5)
Creatinine, Ser: 0.45 mg/dL — ABNORMAL LOW (ref 0.50–1.10)
GFR calc Af Amer: >90 mL/min (ref >90)
GFR calc non Af Amer: >90 mL/min (ref >90)
Glucose, Bld: 77 mg/dL (ref 70–99)
POTASSIUM: 4.2 meq/L (ref 3.7–5.3)
SODIUM: 140 meq/L (ref 137–147)
Total Protein: 6.5 g/dL (ref 6.0–8.3)

## 2014-09-17 LAB — URINALYSIS, ROUTINE W REFLEX MICROSCOPIC
Bilirubin Urine: NEGATIVE
Glucose, UA: NEGATIVE mg/dL
KETONES UR: NEGATIVE mg/dL
Leukocytes, UA: NEGATIVE
Nitrite: NEGATIVE
Protein, ur: NEGATIVE mg/dL
Specific Gravity, Urine: 1.009 (ref 1.005–1.030)
Urobilinogen, UA: 0.2 mg/dL (ref 0.0–1.0)
pH: 6.5 (ref 5.0–8.0)

## 2014-09-17 LAB — CBC WITH DIFFERENTIAL/PLATELET
BASOS ABS: 0 10*3/uL (ref 0.0–0.1)
Basophils Relative: 0 % (ref 0–1)
Eosinophils Absolute: 1.5 10*3/uL — ABNORMAL HIGH (ref 0.0–0.7)
Eosinophils Relative: 14 % — ABNORMAL HIGH (ref 0–5)
HCT: 36.4 % (ref 36.0–46.0)
Hemoglobin: 11.2 g/dL — ABNORMAL LOW (ref 12.0–15.0)
LYMPHS PCT: 21 % (ref 12–46)
Lymphs Abs: 2.3 10*3/uL (ref 0.7–4.0)
MCH: 25.7 pg — ABNORMAL LOW (ref 26.0–34.0)
MCHC: 30.8 g/dL (ref 30.0–36.0)
MCV: 83.7 fL (ref 78.0–100.0)
Monocytes Absolute: 0.8 10*3/uL (ref 0.1–1.0)
Monocytes Relative: 7 % (ref 3–12)
NEUTROS ABS: 6.2 10*3/uL (ref 1.7–7.7)
Neutrophils Relative %: 58 % (ref 43–77)
PLATELETS: 549 10*3/uL — AB (ref 150–400)
RBC: 4.35 MIL/uL (ref 3.87–5.11)
RDW: 14.7 % (ref 11.5–15.5)
WBC: 10.8 10*3/uL — ABNORMAL HIGH (ref 4.0–10.5)

## 2014-09-17 LAB — APTT: aPTT: 34 seconds (ref 24–37)

## 2014-09-17 LAB — TYPE AND SCREEN
ABO/RH(D): O POS
Antibody Screen: NEGATIVE

## 2014-09-17 LAB — PROTIME-INR
INR: 1.03 (ref 0.00–1.49)
Prothrombin Time: 13.5 seconds (ref 11.6–15.2)

## 2014-09-17 LAB — URINE MICROSCOPIC-ADD ON: URINE-OTHER: NONE SEEN

## 2014-09-17 SURGERY — CLOSED MANIPULATION, JOINT, HIP
Anesthesia: General | Laterality: Left

## 2014-09-17 MED ORDER — PROPOFOL 10 MG/ML IV BOLUS
INTRAVENOUS | Status: AC
  Filled 2014-09-17: qty 20

## 2014-09-17 MED ORDER — FENTANYL CITRATE 0.05 MG/ML IJ SOLN
INTRAMUSCULAR | Status: DC | PRN
  Administered 2014-09-17: 50 ug via INTRAVENOUS

## 2014-09-17 MED ORDER — FENTANYL CITRATE 0.05 MG/ML IJ SOLN: 25.0000 ug | INTRAMUSCULAR | Status: DC | PRN

## 2014-09-17 MED ORDER — FENTANYL CITRATE 0.05 MG/ML IJ SOLN
50.0000 ug | INTRAMUSCULAR | Status: DC | PRN
  Administered 2014-09-17: 50 ug via INTRAVENOUS
  Filled 2014-09-17: qty 2

## 2014-09-17 MED ORDER — LACTATED RINGERS IV SOLN: INTRAVENOUS | Status: DC

## 2014-09-17 MED ORDER — HYDROMORPHONE HCL 1 MG/ML IJ SOLN
0.5000 mg | INTRAMUSCULAR | Status: DC | PRN
  Administered 2014-09-17: 0.5 mg via INTRAVENOUS
  Filled 2014-09-17: qty 1

## 2014-09-17 MED ORDER — ONDANSETRON HCL 4 MG/2ML IJ SOLN
INTRAMUSCULAR | Status: DC | PRN
  Administered 2014-09-17: 4 mg via INTRAVENOUS

## 2014-09-17 MED ORDER — FENTANYL CITRATE 0.05 MG/ML IJ SOLN
INTRAMUSCULAR | Status: AC
  Filled 2014-09-17: qty 5

## 2014-09-17 MED ORDER — LIDOCAINE HCL (CARDIAC) 20 MG/ML IV SOLN
INTRAVENOUS | Status: DC | PRN
  Administered 2014-09-17: 50 mg via INTRAVENOUS

## 2014-09-17 MED ORDER — ONDANSETRON HCL 4 MG/2ML IJ SOLN
4.0000 mg | Freq: Once | INTRAMUSCULAR | Status: AC
  Administered 2014-09-17: 4 mg via INTRAVENOUS
  Filled 2014-09-17: qty 2

## 2014-09-17 MED ORDER — MIDAZOLAM HCL 2 MG/2ML IJ SOLN
INTRAMUSCULAR | Status: AC
  Filled 2014-09-17: qty 2

## 2014-09-17 MED ORDER — PROPOFOL 10 MG/ML IV BOLUS
INTRAVENOUS | Status: DC | PRN
  Administered 2014-09-17: 120 mg via INTRAVENOUS

## 2014-09-17 MED ORDER — LACTATED RINGERS IV SOLN
INTRAVENOUS | Status: DC | PRN
  Administered 2014-09-17: 19:00:00 via INTRAVENOUS

## 2014-09-17 MED ORDER — PROMETHAZINE HCL 25 MG/ML IJ SOLN: 6.2500 mg | INTRAMUSCULAR | Status: DC | PRN

## 2014-09-17 MED ORDER — HYDROMORPHONE HCL 1 MG/ML IJ SOLN
1.0000 mg | Freq: Once | INTRAMUSCULAR | Status: AC
  Administered 2014-09-17: 1 mg via INTRAVENOUS
  Filled 2014-09-17: qty 1

## 2014-09-17 MED ORDER — FENTANYL CITRATE 0.05 MG/ML IJ SOLN
INTRAMUSCULAR | Status: AC
  Filled 2014-09-17: qty 2

## 2014-09-17 SURGICAL SUPPLY — 18 items
BANDAGE ADH SHEER 1  50/CT (GAUZE/BANDAGES/DRESSINGS) IMPLANT
CHLORAPREP W/TINT 26ML (MISCELLANEOUS) IMPLANT
CLOTH 2% CHLOROHEXIDINE 3PK (PERSONAL CARE ITEMS) IMPLANT
GAUZE SPONGE 4X4 12PLY STRL (GAUZE/BANDAGES/DRESSINGS) IMPLANT
GLOVE BIOGEL PI IND STRL 7.5 (GLOVE) IMPLANT
GLOVE BIOGEL PI IND STRL 8 (GLOVE) IMPLANT
GLOVE BIOGEL PI INDICATOR 7.5 (GLOVE)
GLOVE BIOGEL PI INDICATOR 8 (GLOVE)
GLOVE SURG SS PI 7.5 STRL IVOR (GLOVE) IMPLANT
GLOVE SURG SS PI 8.0 STRL IVOR (GLOVE) IMPLANT
GOWN STRL REUS W/TWL LRG LVL3 (GOWN DISPOSABLE) ×2 IMPLANT
GOWN STRL REUS W/TWL XL LVL3 (GOWN DISPOSABLE) ×4 IMPLANT
IMMOBILIZER KNEE 20 (SOFTGOODS) ×2 IMPLANT
MANIFOLD NEPTUNE II (INSTRUMENTS) ×2 IMPLANT
NDL SAFETY ECLIPSE 18X1.5 (NEEDLE) IMPLANT
NEEDLE HYPO 18GX1.5 SHARP (NEEDLE)
POSITIONER SURGICAL ARM (MISCELLANEOUS) ×2 IMPLANT
SYR CONTROL 10ML LL (SYRINGE) IMPLANT

## 2014-09-17 NOTE — Transfer of Care (Signed)
Immediate Anesthesia Transfer of Care Note  Patient: [name]  Procedure(s) Performed: Procedure(s) (LRB): CLOSED MANIPULATION HIP (Left)  Patient Location: PACU  Anesthesia Type: General  Level of Consciousness: sedated, patient cooperative and responds to stimulation  Airway & Oxygen Therapy: Patient Spontanous Breathing and Patient connected to face mask oxgen  Post-op Assessment: Report given to PACU RN and Post -op Vital signs reviewed and stable  Post vital signs: Reviewed and stable  Complications: No apparent anesthesia complications

## 2014-09-17 NOTE — Anesthesia Preprocedure Evaluation (Signed)
Anesthesia Evaluation  Patient identified by MRN, date of birth, ID band Patient awake    Reviewed: Allergy & Precautions, H&P , NPO status , Patient's Chart, lab work & pertinent test results  History of Anesthesia Complications Negative for: history of anesthetic complications  Airway Mallampati: II TM Distance: >3 FB Neck ROM: Full    Dental no notable dental hx. (+)    Pulmonary former smoker,  breath sounds clear to auscultation  Pulmonary exam normal       Cardiovascular negative cardio ROS  Rhythm:Regular Rate:Normal     Neuro/Psych PSYCHIATRIC DISORDERS Anxiety Depression negative neurological ROS     GI/Hepatic negative GI ROS, Neg liver ROS, Patient denies GERD   Endo/Other  negative endocrine ROS  Renal/GU negative Renal ROS  negative genitourinary   Musculoskeletal  (+) Arthritis -, Osteoarthritis,    Abdominal   Peds negative pediatric ROS (+)  Hematology negative hematology ROS (+)   Anesthesia Other Findings   Reproductive/Obstetrics negative OB ROS                           Anesthesia Physical Anesthesia Plan  ASA: II  Anesthesia Plan: General   Post-op Pain Management:    Induction: Intravenous  Airway Management Planned: Oral ETT  Additional Equipment:   Intra-op Plan:   Post-operative Plan: Extubation in OR  Informed Consent: I have reviewed the patients History and Physical, chart, labs and discussed the procedure including the risks, benefits and alternatives for the proposed anesthesia with the patient or authorized representative who has indicated his/her understanding and acceptance.   Dental advisory given  Plan Discussed with: CRNA  Anesthesia Plan Comments: (NPO >24 hours to solids, NPO to clear liquids since 9am, denies GERD or GERD symptoms. Hx of multiple orthopedic surgeries. Surgeon requests complete relaxation. Plan for GA with ETT. )         Anesthesia Quick Evaluation

## 2014-09-17 NOTE — Discharge Instructions (Signed)
Knee immobilizer on at all times. Take one aspirin per day . Weight bear as tolerated. See Dr. Despina Hick in 1-2 weeks Post Anesthesia Home Care Instructions  Activity: Get plenty of rest for the remainder of the day. A responsible adult should stay with you for 24 hours following the procedure.  For the next 24 hours, DO NOT: -Drive a car -Advertising copywriter -Drink alcoholic beverages -Take any medication unless instructed by your physician -Make any legal decisions or sign important papers.  Meals: Start with liquid foods such as gelatin or soup. Progress to regular foods as tolerated. Avoid greasy, spicy, heavy foods. If nausea and/or vomiting occur, drink only clear liquids until the nausea and/or vomiting subsides. Call your physician if vomiting continues.  Special Instructions/Symptoms: Your throat may feel dry or sore from the anesthesia or the breathing tube placed in your throat during surgery. If this causes discomfort, gargle with warm salt water. The discomfort should disappear within 24 hours.

## 2014-09-17 NOTE — Brief Op Note (Signed)
09/17/2014  7:44 PM  PATIENT:  [name]  63 y.o. female  PRE-OPERATIVE DIAGNOSIS:  dislocated left hip  POST-OPERATIVE DIAGNOSIS:  dislocated left hip  PROCEDURE:  Procedure(s): CLOSED MANIPULATION HIP (Left)  SURGEON:  Surgeon(s) and Role:    * Javier Docker, MD - Primary  PHYSICIAN ASSISTANT:   ASSISTANTS: none   ANESTHESIA:   general  EBL:     BLOOD ADMINISTERED:none  DRAINS: none   LOCAL MEDICATIONS USED:   SPECIMEN:  No Specimen  DISPOSITION OF SPECIMEN:  N/A  COUNTS:  YES  TOURNIQUET:  * No tourniquets in log *  DICTATION: .Other Dictation: Dictation Number [id number]  PLAN OF CARE: Discharge to home after PACU  PATIENT DISPOSITION:  PACU - hemodynamically stable.   Delay start of Pharmacological VTE agent (>24hrs) due to surgical blood loss or risk of bleeding: no

## 2014-09-17 NOTE — Addendum Note (Signed)
Addendum created 09/17/14 2014 by Paris Lore, CRNA   Modules edited: Anesthesia Flowsheet

## 2014-09-17 NOTE — Anesthesia Postprocedure Evaluation (Signed)
  Anesthesia Post-op Note  Patient: [name]  Procedure(s) Performed: Procedure(s) (LRB): CLOSED MANIPULATION HIP (Left)  Patient Location: PACU  Anesthesia Type: General  Level of Consciousness: awake and alert   Airway and Oxygen Therapy: Patient Spontanous Breathing  Post-op Pain: mild  Post-op Assessment: Post-op Vital signs reviewed, Patient's Cardiovascular Status Stable, Respiratory Function Stable, Patent Airway and No signs of Nausea or vomiting  Last Vitals:  Filed Vitals:   09/17/14 1956  BP:   Pulse:   Temp: 36.8 C  Resp:     Post-op Vital Signs: stable   Complications: No apparent anesthesia complications
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
## 2014-09-17 NOTE — ED Notes (Signed)
Bed: WU98 Expected date:  Expected time:  Means of arrival:  Comments: Hold for 20

## 2014-09-17 NOTE — ED Notes (Signed)
Pt presents with c/o of hip injury, initial inspection reveals a shortened left leg, mild swelling, pt states she had a hip surgery, she noticed the change/ shortening while getting into her wheel chair today. Reports pain 7/10 on the affected leg.
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
## 2014-09-18 NOTE — Op Note (Signed)
[name], [name]                 ACCOUNT NO.:  [id number]  MEDICAL RECORD NO.:  [id number]  LOCATION:  [facility]                         FACILITY:  [facility]  PHYSICIAN:  Jene Every, M.D.    DATE OF BIRTH:  [birth year]  DATE OF PROCEDURE:  09/17/2014 DATE OF DISCHARGE:                              OPERATIVE REPORT   PREOPERATIVE DIAGNOSIS:  Dislocated left total hip replacement.  POSTOPERATIVE DIAGNOSIS:  Dislocated left total hip replacement.  PROCEDURE PERFORMED:  Closed reduction under anesthesia.  Exam under anesthesia, left hip.  BRIEF HISTORY:  A 64 year old with dislocated hip, revision recently by Dr. Lequita Halt with allograft strut reconstruction.  She was recently seen in the office, had slight movement of the claw plate.  She dislocated today sitting in the wheelchair superiorly and posteriorly, called Dr. Lequita Halt. The radiographs as I describe were no change with those seen in the office, and he recommended proceeding with closed reduction and I discussed this with the family.  Risks and benefits were discussed including bleeding, infection, DVT, PE, etc., inability to reduce.  TECHNIQUE:  With the patient in a supine position, after the induction of adequate anesthesia and under x-ray, the pelvis stabilized with gentle longitudinal traction, flexion to 90 degrees, internal rotation, and had an immediate reduction without complication.  Leg lengths were then near equivalent  We obtained radiographs AP and frog leg and they showed concentric reduction of the prosthesis, and we also took AP x- rays of the femur, no fracture of the femur or dissociation of the components.  She was stable at 90 degrees of flexion, full extension with internal rotation, 90 degrees of flexion and internal rotation, and with axial loading of the hip, therefore placed her in a knee immobilizer, extubated her without difficulty, and transported to the recovery room in a satisfactory condition.  The  patient tolerated the procedure well.  No complications.  No blood loss.     Jene Every, M.D.     [name]  D:  09/17/2014  T:  09/17/2014  Job:  [id number]

## 2014-09-19 ENCOUNTER — Encounter (HOSPITAL_COMMUNITY): Payer: Self-pay | Admitting: Specialist

## 2014-09-19 LAB — URINE CULTURE
CULTURE: NO GROWTH
Colony Count: NO GROWTH

## 2014-10-03 ENCOUNTER — Encounter (HOSPITAL_COMMUNITY): Payer: Self-pay | Admitting: *Deleted

## 2014-10-03 NOTE — Progress Notes (Signed)
Need orders in EPIc.  Surgery on 10/04/14.  Thank You.

## 2014-10-03 NOTE — Progress Notes (Signed)
Paged and spoke with Dr Council MechaniceNenny. Patient may have toast in am and clear liquids until 2pm.  Spoke with patient and instructed.  Patient verbalized understanding.

## 2014-10-03 NOTE — Progress Notes (Signed)
Left message with Toniann FailWendy at Dr. Deri FuellingAluisio's office for orders.

## 2014-10-04 ENCOUNTER — Other Ambulatory Visit: Payer: Self-pay | Admitting: Orthopedic Surgery

## 2014-10-04 ENCOUNTER — Encounter (HOSPITAL_COMMUNITY)
Admission: RE | Disposition: A | Discharge status: Home / Self Care | Payer: Self-pay | Source: Ambulatory Visit | Attending: Orthopedic Surgery

## 2014-10-04 ENCOUNTER — Ambulatory Visit (HOSPITAL_COMMUNITY): Payer: BC Managed Care – PPO | Admitting: Anesthesiology

## 2014-10-04 ENCOUNTER — Encounter (HOSPITAL_COMMUNITY): Payer: BC Managed Care – PPO | Admitting: Anesthesiology

## 2014-10-04 ENCOUNTER — Observation Stay (HOSPITAL_COMMUNITY)
Admission: RE | Admit: 2014-10-04 | Discharge: 2014-10-05 | Disposition: A | Discharge status: Home / Self Care | Payer: BC Managed Care – PPO | Source: Ambulatory Visit | Attending: Orthopedic Surgery | Admitting: Orthopedic Surgery

## 2014-10-04 ENCOUNTER — Encounter (HOSPITAL_COMMUNITY): Payer: Self-pay | Admitting: *Deleted

## 2014-10-04 DIAGNOSIS — F329 Major depressive disorder, single episode, unspecified: Secondary | ICD-10-CM | POA: Insufficient documentation

## 2014-10-04 DIAGNOSIS — K219 Gastro-esophageal reflux disease without esophagitis: Secondary | ICD-10-CM | POA: Insufficient documentation

## 2014-10-04 DIAGNOSIS — F419 Anxiety disorder, unspecified: Secondary | ICD-10-CM | POA: Insufficient documentation

## 2014-10-04 DIAGNOSIS — M96830 Postprocedural hemorrhage and hematoma of a musculoskeletal structure following a musculoskeletal system procedure: Principal | ICD-10-CM | POA: Insufficient documentation

## 2014-10-04 DIAGNOSIS — S7002XD Contusion of left hip, subsequent encounter: Secondary | ICD-10-CM

## 2014-10-04 DIAGNOSIS — S7002XA Contusion of left hip, initial encounter: Secondary | ICD-10-CM | POA: Diagnosis present

## 2014-10-04 DIAGNOSIS — M810 Age-related osteoporosis without current pathological fracture: Secondary | ICD-10-CM | POA: Insufficient documentation

## 2014-10-04 HISTORY — DX: Fracture of unspecified carpal bone, left wrist, initial encounter for open fracture: S62.102B

## 2014-10-04 HISTORY — DX: Fracture of unspecified carpal bone, left wrist, initial encounter for closed fracture: S62.102A

## 2014-10-04 HISTORY — PX: INCISION AND DRAINAGE HIP: SHX1801

## 2014-10-04 SURGERY — IRRIGATION AND DEBRIDEMENT HIP
Anesthesia: General | Site: Hip | Laterality: Left

## 2014-10-04 MED ORDER — METHOCARBAMOL 500 MG PO TABS
500.0000 mg | ORAL_TABLET | Freq: Four times a day (QID) | ORAL | Status: DC
  Administered 2014-10-04 – 2014-10-05 (×2): 500 mg via ORAL
  Filled 2014-10-04 (×5): qty 1

## 2014-10-04 MED ORDER — LIDOCAINE HCL (CARDIAC) 20 MG/ML IV SOLN
INTRAVENOUS | Status: DC | PRN
  Administered 2014-10-04: 50 mg via INTRAVENOUS

## 2014-10-04 MED ORDER — HYDROMORPHONE HCL 1 MG/ML IJ SOLN
INTRAMUSCULAR | Status: AC
  Filled 2014-10-04: qty 1

## 2014-10-04 MED ORDER — ONDANSETRON HCL 4 MG PO TABS: 4.0000 mg | ORAL_TABLET | Freq: Four times a day (QID) | ORAL | Status: DC | PRN

## 2014-10-04 MED ORDER — SODIUM CHLORIDE 0.9 % IV SOLN
INTRAVENOUS | Status: DC
  Administered 2014-10-04: 22:00:00 via INTRAVENOUS

## 2014-10-04 MED ORDER — PROPOFOL 10 MG/ML IV BOLUS
INTRAVENOUS | Status: DC | PRN
  Administered 2014-10-04: 175 mg via INTRAVENOUS

## 2014-10-04 MED ORDER — FLUOXETINE HCL 20 MG PO CAPS
40.0000 mg | ORAL_CAPSULE | Freq: Every morning | ORAL | Status: DC
  Administered 2014-10-05: 40 mg via ORAL
  Filled 2014-10-04: qty 2

## 2014-10-04 MED ORDER — GABAPENTIN 400 MG PO CAPS
800.0000 mg | ORAL_CAPSULE | Freq: Four times a day (QID) | ORAL | Status: DC
  Administered 2014-10-04 – 2014-10-05 (×2): 800 mg via ORAL
  Filled 2014-10-04 (×5): qty 2

## 2014-10-04 MED ORDER — DEXAMETHASONE SODIUM PHOSPHATE 10 MG/ML IJ SOLN
10.0000 mg | Freq: Once | INTRAMUSCULAR | Status: AC
  Administered 2014-10-04: 10 mg via INTRAVENOUS

## 2014-10-04 MED ORDER — FENTANYL CITRATE 0.05 MG/ML IJ SOLN
INTRAMUSCULAR | Status: AC
  Filled 2014-10-04: qty 5

## 2014-10-04 MED ORDER — FENTANYL CITRATE 0.05 MG/ML IJ SOLN
INTRAMUSCULAR | Status: DC | PRN
  Administered 2014-10-04: 50 ug via INTRAVENOUS
  Administered 2014-10-04: 100 ug via INTRAVENOUS
  Administered 2014-10-04 (×2): 50 ug via INTRAVENOUS

## 2014-10-04 MED ORDER — PHENYLEPHRINE HCL 10 MG/ML IJ SOLN
INTRAMUSCULAR | Status: DC | PRN
  Administered 2014-10-04: 40 ug via INTRAVENOUS

## 2014-10-04 MED ORDER — HYDROMORPHONE HCL 1 MG/ML IJ SOLN
0.2500 mg | INTRAMUSCULAR | Status: DC | PRN
  Administered 2014-10-04: 0.5 mg via INTRAVENOUS

## 2014-10-04 MED ORDER — HYDROMORPHONE HCL 1 MG/ML IJ SOLN
0.5000 mg | INTRAMUSCULAR | Status: DC | PRN
  Administered 2014-10-04 – 2014-10-05 (×2): 1 mg via INTRAVENOUS
  Filled 2014-10-04 (×2): qty 1

## 2014-10-04 MED ORDER — ONDANSETRON HCL 4 MG/2ML IJ SOLN
INTRAMUSCULAR | Status: AC
  Filled 2014-10-04: qty 2

## 2014-10-04 MED ORDER — CEFAZOLIN SODIUM-DEXTROSE 2-3 GM-% IV SOLR
2.0000 g | INTRAVENOUS | Status: AC
  Administered 2014-10-04: 2 g via INTRAVENOUS

## 2014-10-04 MED ORDER — LIDOCAINE HCL (CARDIAC) 20 MG/ML IV SOLN
INTRAVENOUS | Status: AC
  Filled 2014-10-04: qty 5

## 2014-10-04 MED ORDER — DEXAMETHASONE SODIUM PHOSPHATE 10 MG/ML IJ SOLN
INTRAMUSCULAR | Status: AC
  Filled 2014-10-04: qty 1

## 2014-10-04 MED ORDER — LACTATED RINGERS IV SOLN
INTRAVENOUS | Status: DC | PRN
  Administered 2014-10-04 (×2): via INTRAVENOUS

## 2014-10-04 MED ORDER — CEFAZOLIN SODIUM-DEXTROSE 2-3 GM-% IV SOLR: 2.0000 g | INTRAVENOUS | Status: DC

## 2014-10-04 MED ORDER — ONDANSETRON HCL 4 MG/2ML IJ SOLN
INTRAMUSCULAR | Status: DC | PRN
  Administered 2014-10-04 (×2): 2 mg via INTRAVENOUS

## 2014-10-04 MED ORDER — METOCLOPRAMIDE HCL 5 MG/ML IJ SOLN: 5.0000 mg | Freq: Three times a day (TID) | INTRAMUSCULAR | Status: DC | PRN

## 2014-10-04 MED ORDER — ACETAMINOPHEN 10 MG/ML IV SOLN
1000.0000 mg | Freq: Once | INTRAVENOUS | Status: AC
  Administered 2014-10-04: 1000 mg via INTRAVENOUS
  Filled 2014-10-04: qty 100

## 2014-10-04 MED ORDER — EPHEDRINE SULFATE 50 MG/ML IJ SOLN
INTRAMUSCULAR | Status: DC | PRN
  Administered 2014-10-04 (×2): 5 mg via INTRAVENOUS

## 2014-10-04 MED ORDER — SODIUM CHLORIDE 0.9 % IV SOLN: INTRAVENOUS | Status: DC

## 2014-10-04 MED ORDER — SUCCINYLCHOLINE CHLORIDE 20 MG/ML IJ SOLN
INTRAMUSCULAR | Status: DC | PRN
  Administered 2014-10-04: 100 mg via INTRAVENOUS

## 2014-10-04 MED ORDER — MIDAZOLAM HCL 5 MG/5ML IJ SOLN
INTRAMUSCULAR | Status: DC | PRN
  Administered 2014-10-04 (×2): 1 mg via INTRAVENOUS

## 2014-10-04 MED ORDER — SODIUM CHLORIDE 0.9 % IR SOLN
Status: DC | PRN
  Administered 2014-10-04: 1000 mL

## 2014-10-04 MED ORDER — CEFAZOLIN SODIUM-DEXTROSE 2-3 GM-% IV SOLR
INTRAVENOUS | Status: AC
  Filled 2014-10-04: qty 50

## 2014-10-04 MED ORDER — PROMETHAZINE HCL 25 MG/ML IJ SOLN: 6.2500 mg | INTRAMUSCULAR | Status: DC | PRN

## 2014-10-04 MED ORDER — METOCLOPRAMIDE HCL 10 MG PO TABS: 5.0000 mg | ORAL_TABLET | Freq: Three times a day (TID) | ORAL | Status: DC | PRN

## 2014-10-04 MED ORDER — ENOXAPARIN SODIUM 40 MG/0.4ML ~~LOC~~ SOLN
40.0000 mg | SUBCUTANEOUS | Status: DC
  Administered 2014-10-05: 40 mg via SUBCUTANEOUS
  Filled 2014-10-04 (×2): qty 0.4

## 2014-10-04 MED ORDER — ALPRAZOLAM 1 MG PO TABS: 1.0000 mg | ORAL_TABLET | Freq: Two times a day (BID) | ORAL | Status: DC | PRN

## 2014-10-04 MED ORDER — PROPOFOL 10 MG/ML IV BOLUS
INTRAVENOUS | Status: AC
  Filled 2014-10-04: qty 20

## 2014-10-04 MED ORDER — CHLORHEXIDINE GLUCONATE 4 % EX LIQD: 60.0000 mL | Freq: Once | CUTANEOUS | Status: DC

## 2014-10-04 MED ORDER — SODIUM CHLORIDE 0.9 % IR SOLN
Status: DC | PRN
  Administered 2014-10-04: 3000 mL

## 2014-10-04 MED ORDER — CLONAZEPAM 0.5 MG PO TABS
0.5000 mg | ORAL_TABLET | Freq: Every day | ORAL | Status: DC
  Administered 2014-10-04: 0.5 mg via ORAL
  Filled 2014-10-04: qty 1

## 2014-10-04 MED ORDER — VANCOMYCIN HCL IN DEXTROSE 1-5 GM/200ML-% IV SOLN
1000.0000 mg | Freq: Two times a day (BID) | INTRAVENOUS | Status: AC
  Administered 2014-10-05: 1000 mg via INTRAVENOUS
  Filled 2014-10-04 (×2): qty 200

## 2014-10-04 MED ORDER — ONDANSETRON HCL 4 MG/2ML IJ SOLN: 4.0000 mg | Freq: Four times a day (QID) | INTRAMUSCULAR | Status: DC | PRN

## 2014-10-04 MED ORDER — MIDAZOLAM HCL 2 MG/2ML IJ SOLN
INTRAMUSCULAR | Status: AC
  Filled 2014-10-04: qty 2

## 2014-10-04 MED ORDER — OXYCODONE HCL 5 MG PO TABS
5.0000 mg | ORAL_TABLET | ORAL | Status: DC | PRN
Start: 2014-10-04 — End: 2014-10-05
  Administered 2014-10-05 (×2): 10 mg via ORAL
  Filled 2014-10-04 (×2): qty 2

## 2014-10-04 SURGICAL SUPPLY — 43 items
BAG ZIPLOCK 12X15 (MISCELLANEOUS) ×2 IMPLANT
DRAPE INCISE IOBAN 66X45 STRL (DRAPES) ×2 IMPLANT
DRAPE SPLIT 77X100IN (DRAPES) ×4 IMPLANT
DRAPE U-SHAPE 47X51 STRL (DRAPES) ×2 IMPLANT
DRSG ADAPTIC 3X8 NADH LF (GAUZE/BANDAGES/DRESSINGS) IMPLANT
DRSG EMULSION OIL 3X16 NADH (GAUZE/BANDAGES/DRESSINGS) ×2 IMPLANT
DRSG MEPILEX BORDER 4X12 (GAUZE/BANDAGES/DRESSINGS) ×2 IMPLANT
DRSG MEPILEX BORDER 4X4 (GAUZE/BANDAGES/DRESSINGS) ×2 IMPLANT
DRSG MEPILEX BORDER 4X8 (GAUZE/BANDAGES/DRESSINGS) IMPLANT
DURAPREP 26ML APPLICATOR (WOUND CARE) ×2 IMPLANT
ELECT REM PT RETURN 9FT ADLT (ELECTROSURGICAL) ×2
ELECTRODE REM PT RTRN 9FT ADLT (ELECTROSURGICAL) ×1 IMPLANT
EVACUATOR 1/8 PVC DRAIN (DRAIN) ×2 IMPLANT
GAUZE SPONGE 4X4 12PLY STRL (GAUZE/BANDAGES/DRESSINGS) IMPLANT
GLOVE BIO SURGEON STRL SZ7.5 (GLOVE) ×2 IMPLANT
GLOVE BIO SURGEON STRL SZ8 (GLOVE) ×4 IMPLANT
GLOVE BIOGEL PI IND STRL 7.0 (GLOVE) ×2 IMPLANT
GLOVE BIOGEL PI IND STRL 8 (GLOVE) ×3 IMPLANT
GLOVE BIOGEL PI INDICATOR 7.0 (GLOVE) ×2
GLOVE BIOGEL PI INDICATOR 8 (GLOVE) ×3
GLOVE SURG SS PI 6.5 STRL IVOR (GLOVE) ×2 IMPLANT
GOWN STRL REUS W/TWL LRG LVL3 (GOWN DISPOSABLE) ×4 IMPLANT
GOWN STRL REUS W/TWL XL LVL3 (GOWN DISPOSABLE) ×2 IMPLANT
HANDPIECE INTERPULSE COAX TIP (DISPOSABLE) ×1
IV NS IRRIG 3000ML ARTHROMATIC (IV SOLUTION) ×2 IMPLANT
KIT BASIN OR (CUSTOM PROCEDURE TRAY) ×2 IMPLANT
MANIFOLD NEPTUNE II (INSTRUMENTS) ×2 IMPLANT
NS IRRIG 1000ML POUR BTL (IV SOLUTION) ×2 IMPLANT
PACK TOTAL JOINT (CUSTOM PROCEDURE TRAY) ×2 IMPLANT
PADDING CAST COTTON 6X4 STRL (CAST SUPPLIES) IMPLANT
POSITIONER SURGICAL ARM (MISCELLANEOUS) ×2 IMPLANT
SET HNDPC FAN SPRY TIP SCT (DISPOSABLE) ×1 IMPLANT
STAPLER VISISTAT 35W (STAPLE) ×2 IMPLANT
SUT ETHILON 4 0 PS 2 18 (SUTURE) ×2 IMPLANT
SUT MNCRL AB 4-0 PS2 18 (SUTURE) IMPLANT
SUT VIC AB 1 CT1 27 (SUTURE) ×3
SUT VIC AB 1 CT1 27XBRD ANTBC (SUTURE) ×3 IMPLANT
SUT VIC AB 2-0 CT1 27 (SUTURE) ×3
SUT VIC AB 2-0 CT1 TAPERPNT 27 (SUTURE) ×3 IMPLANT
SUT VLOC 180 0 24IN GS25 (SUTURE) ×2 IMPLANT
SWAB COLLECTION DEVICE MRSA (MISCELLANEOUS) ×2 IMPLANT
TOWEL OR 17X26 10 PK STRL BLUE (TOWEL DISPOSABLE) ×4 IMPLANT
TUBE ANAEROBIC SPECIMEN COL (MISCELLANEOUS) ×2 IMPLANT

## 2014-10-04 NOTE — Brief Op Note (Signed)
10/04/2014  7:53 PM  PATIENT:  [name]  63 y.o. female  PRE-OPERATIVE DIAGNOSIS:  LEFT HIP HEMATOMA   POST-OPERATIVE DIAGNOSIS:  left hip hematoma  PROCEDURE:  Procedure(s): LEFT HIP IRRIGATION AND DEBRIDEMENT WITH HARDWARE REMOVAL (Left)  SURGEON:  Surgeon(s) and Role:    * Loanne DrillingFrank Kalix Meinecke V, MD - Primary  PHYSICIAN ASSISTANT:   ASSISTANTS: [name], PA-C   ANESTHESIA:   general  EBL:     DRAINS: (Medium) Hemovact drain(s) in the left hip with  Suction Open   LOCAL MEDICATIONS USED:  NONE  COUNTS:  YES  TOURNIQUET:  * No tourniquets in log *  DICTATION: .Other Dictation: Dictation Number [id number]  PLAN OF CARE: Admit for overnight observation  PATIENT DISPOSITION:  PACU - hemodynamically stable.

## 2014-10-04 NOTE — Progress Notes (Signed)
Preoperative surgical orders have been place into the Epic hospital system for [name] on 10/04/2014, 10:16 AM  by [name], ALEXZANDREW for surgery on 10/04/2014.  Preop Hip I&D orders including IV Tylenol and IV Decadron as long as there are no contraindications to the above medications. [name], PA-C

## 2014-10-04 NOTE — Anesthesia Postprocedure Evaluation (Signed)
  Anesthesia Post-op Note  Patient: [name]  Procedure(s) Performed: Procedure(s) (LRB): LEFT HIP IRRIGATION AND DEBRIDEMENT WITH HARDWARE REMOVAL (Left)  Patient Location: PACU  Anesthesia Type: General  Level of Consciousness: awake and alert   Airway and Oxygen Therapy: Patient Spontanous Breathing  Post-op Pain: mild  Post-op Assessment: Post-op Vital signs reviewed, Patient's Cardiovascular Status Stable, Respiratory Function Stable, Patent Airway and No signs of Nausea or vomiting  Last Vitals:  Filed Vitals:   10/04/14 1615  BP: 120/61  Pulse: 85  Temp: 36.7 C  Resp: 18    Post-op Vital Signs: stable   Complications: No apparent anesthesia complications
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
## 2014-10-04 NOTE — Anesthesia Procedure Notes (Addendum)
Performed by: Edison PaceGRAY, Kimiko Common E   Procedure Name: Intubation Date/Time: 10/04/2014 7:09 PM Performed by: Edison PaceGRAY, Legacy Lacivita E Pre-anesthesia Checklist: Patient identified, Timeout performed, Emergency Drugs available, Suction available and Patient being monitored Patient Re-evaluated:Patient Re-evaluated prior to inductionOxygen Delivery Method: Circle system utilized Preoxygenation: Pre-oxygenation with 100% oxygen Intubation Type: IV induction Ventilation: Mask ventilation without difficulty Laryngoscope Size: Mac and 3 Grade View: Grade II Tube type: Oral Tube size: 7.5 mm Number of attempts: 1 Airway Equipment and Method: Stylet Placement Confirmation: positive ETCO2,  ETT inserted through vocal cords under direct vision and breath sounds checked- equal and bilateral Secured at: 21 cm Tube secured with: Tape Dental Injury: Teeth and Oropharynx as per pre-operative assessment

## 2014-10-04 NOTE — Transfer of Care (Signed)
Immediate Anesthesia Transfer of Care Note  Patient: [name]  Procedure(s) Performed: Procedure(s): LEFT HIP IRRIGATION AND DEBRIDEMENT WITH HARDWARE REMOVAL (Left)  Patient Location: PACU  Anesthesia Type:General  Level of Consciousness: awake, alert  and oriented  Airway & Oxygen Therapy: Patient Spontanous Breathing and Patient connected to face mask oxygen  Post-op Assessment: Report given to PACU RN, Post -op Vital signs reviewed and stable and Patient moving all extremities  Post vital signs: Reviewed and stable  Complications: No apparent anesthesia complications

## 2014-10-04 NOTE — Anesthesia Preprocedure Evaluation (Addendum)
Anesthesia Evaluation  Patient identified by MRN, date of birth, ID band Patient awake    Reviewed: Allergy & Precautions, H&P , NPO status , Patient's Chart, lab work & pertinent test results  Airway Mallampati: II TM Distance: >3 FB Neck ROM: Full    Dental no notable dental hx.    Pulmonary neg pulmonary ROS, former smoker,  breath sounds clear to auscultation  Pulmonary exam normal       Cardiovascular negative cardio ROS  Rhythm:Regular Rate:Normal     Neuro/Psych Anxiety negative neurological ROS     GI/Hepatic Neg liver ROS,   Endo/Other  negative endocrine ROS  Renal/GU negative Renal ROS  negative genitourinary   Musculoskeletal negative musculoskeletal ROS (+)   Abdominal   Peds negative pediatric ROS (+)  Hematology negative hematology ROS (+)   Anesthesia Other Findings   Reproductive/Obstetrics negative OB ROS                           Anesthesia Physical Anesthesia Plan  ASA: III  Anesthesia Plan: General   Post-op Pain Management:    Induction: Intravenous  Airway Management Planned: Oral ETT  Additional Equipment:   Intra-op Plan:   Post-operative Plan: Extubation in OR  Informed Consent: I have reviewed the patients History and Physical, chart, labs and discussed the procedure including the risks, benefits and alternatives for the proposed anesthesia with the patient or authorized representative who has indicated his/her understanding and acceptance.   Dental advisory given  Plan Discussed with: CRNA and Surgeon  Anesthesia Plan Comments:         Anesthesia Quick Evaluation

## 2014-10-04 NOTE — Interval H&P Note (Signed)
History and Physical Interval Note:  10/04/2014 7:02 PM  [name]  has presented today for surgery, with the diagnosis of LEFT HIP HEMATOMA   The various methods of treatment have been discussed with the patient and family. After consideration of risks, benefits and other options for treatment, the patient has consented to  Procedure(s): LEFT HIP IRRIGATION AND DEBRIDEMENT  (Left) as a surgical intervention .  The patient's history has been reviewed, patient examined, no change in status, stable for surgery.  I have reviewed the patient's chart and labs.  Questions were answered to the patient's satisfaction.     Loanne DrillingALUISIO,Zipporah Finamore V

## 2014-10-05 ENCOUNTER — Encounter (HOSPITAL_COMMUNITY): Payer: Self-pay | Admitting: Orthopedic Surgery

## 2014-10-05 DIAGNOSIS — M96830 Postprocedural hemorrhage and hematoma of a musculoskeletal structure following a musculoskeletal system procedure: Secondary | ICD-10-CM | POA: Diagnosis not present

## 2014-10-05 MED ORDER — CEPHALEXIN 250 MG PO CAPS: 250.0000 mg | ORAL_CAPSULE | Freq: Three times a day (TID) | ORAL | Status: DC

## 2014-10-05 NOTE — Op Note (Signed)
[name]:  Limpert, Daliyah                 ACCOUNT NO.:  [id number]  MEDICAL RECORD NO.:  [id number]  LOCATION:  1601                         [facility]  PHYSICIAN:  Ollen GrossFrank Tracia Lacomb, M.D.    DATE OF BIRTH:  07/31/1951  DATE OF PROCEDURE:  10/04/2014 DATE OF DISCHARGE:                              OPERATIVE REPORT   PREOPERATIVE DIAGNOSIS:  Recurrent hematoma, left hip.  POSTOPERATIVE DIAGNOSIS:  Recurrent hematoma, left hip.  PROCEDURE:  Left hip irrigation and debridement, hardware removal.  SURGEON:  Ollen GrossFrank Brodric Schauer, MD  ASSISTANT:  Alexzandrew L. Perkins, PA-C  ANESTHESIA:  General.  ESTIMATED BLOOD LOSS:  Minimal.  DRAINS:  Hemovac x1 sewn in.  COMPLICATIONS:  None.  CONDITION:  Stable to recovery.  BRIEF CLINICAL NOTE:  Darl PikesSusan is a 63 year old female, very complex history in regards to her left hip.  She had a recent major hip revision with proximal femoral allograft.  She has had recurrent effusion and hematoma in the hip despite aspiration in the office.  She presents now for formal arthrotomy, irrigation and debridement, and closure of her drains.  PROCEDURE IN DETAIL:  After successful administration of general anesthetic, the patient was placed in a right lateral decubitus position with the left side up and held with the hip positioner.  The left lower extremity was isolated from perineum with plastic drapes and prepped and draped in the usual sterile fashion.  We just used a small portion of her previous incision, made a skin incision, large hematoma was identified.  Fluid was sent for Gram stain, culture, sensitivity.  It did not have an infectious appearance to it.  Once we drained it, then I opened the incision nearly the full length to inspect the tissues.  She had a plate with cables and it was obvious that this was irritating the tissues.  She is healing enough so we did not need to place any more so I cut the cables, removed the plate, everything was  functioning as a solitary unit.  I then thoroughly irrigated the wound bed with 3 liters of saline utilizing pulsatile lavage.  She had a very healthy appearing wound.  We then placed a Hemovac drain and closed the fascia lata with a running #1 V-Loc suture.  The subcu was closed with interrupted 2-0 Vicryl and skin closed with staples.  Incisions cleaned and dried and a bulky sterile dressing applied.  She was then awakened and transported to recovery in stable condition.     Ollen GrossFrank Aanvi Voyles, M.D.     FA/MEDQ  D:  10/04/2014  T:  10/05/2014  Job:  [id number]

## 2014-10-05 NOTE — Discharge Instructions (Signed)
Pick up stool softner and laxative for home. Do not submerge incision under water. May shower starting Saturday. Continue to use ice for pain and swelling from surgery. Hip precautions.  Total Hip Protocol. Take an 81 mg Aspirin daily.  The Hemovac drains are sewn in place.  Do Not Remove.  Keep drains and record output daily and bring totals to the follow up appointment on next Tuesday October 20th.

## 2014-10-05 NOTE — Plan of Care (Signed)
Problem: Discharge Progression Outcomes Goal: Tubes and drains discontinued if indicated Outcome: Not Met (add Reason) hemovac drain to stay in until post op visit 10/11/14, patient and husband given instructions on emptying and recording output  D [employer]
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
## 2014-10-05 NOTE — Care Management Note (Signed)
    Page 1 of 1   10/05/2014     1:48:03 PM CARE MANAGEMENT NOTE 10/05/2014  Patient:  MARZELLA, MIRACLE   Account Number:  [id number]  Date Initiated:  10/05/2014  Documentation initiated by:  [facility]  Subjective/Objective Assessment:   adm: LEFT HIP IRRIGATION AND DEBRIDEMENT WITH HARDWARE REMOVAL (Left)     Action/Plan:   discharge planning   Anticipated DC Date:  10/05/2014   Anticipated DC Plan:  [place]  CM consult      Choice offered to / List presented to:             Status of service:  Completed, signed off Medicare Important Message given?   (If response is "NO", the following Medicare IM given date fields will be blank) Date Medicare IM given:   Medicare IM given by:   Date Additional Medicare IM given:   Additional Medicare IM given by:    Discharge Disposition:  HOME/SELF CARE  Per UR Regulation:    If discussed at Long Length of Stay Meetings, dates discussed:    Comments:  10/05/14 08:45 CM met with pt in room to see if she had any discharge needs.  Pt states she does not; CM verified with RN no HH ordes.  No other Cm needs were communicated. Mariane Masters, BSN, CM [phone].
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
## 2014-10-05 NOTE — Progress Notes (Signed)
Patient teaching done on hemovac drain with patient and husband, both acknowledge understanding of emptying and recording output.  Sharrell Ku Ahna Konkle RN

## 2014-10-07 LAB — WOUND CULTURE: Culture: NO GROWTH

## 2014-10-09 LAB — ANAEROBIC CULTURE

## 2014-10-14 ENCOUNTER — Encounter (HOSPITAL_COMMUNITY): Payer: Self-pay | Admitting: Emergency Medicine

## 2014-10-14 ENCOUNTER — Emergency Department (HOSPITAL_COMMUNITY): Payer: BC Managed Care – PPO

## 2014-10-14 ENCOUNTER — Emergency Department (HOSPITAL_COMMUNITY)
Admission: EM | Admit: 2014-10-14 | Discharge: 2014-10-14 | Disposition: A | Discharge status: Home / Self Care | Payer: BC Managed Care – PPO | Attending: Emergency Medicine | Admitting: Emergency Medicine

## 2014-10-14 DIAGNOSIS — Z7982 Long term (current) use of aspirin: Secondary | ICD-10-CM | POA: Insufficient documentation

## 2014-10-14 DIAGNOSIS — Z8719 Personal history of other diseases of the digestive system: Secondary | ICD-10-CM | POA: Diagnosis not present

## 2014-10-14 DIAGNOSIS — M1990 Unspecified osteoarthritis, unspecified site: Secondary | ICD-10-CM | POA: Diagnosis not present

## 2014-10-14 DIAGNOSIS — Z8781 Personal history of (healed) traumatic fracture: Secondary | ICD-10-CM | POA: Diagnosis not present

## 2014-10-14 DIAGNOSIS — Z79899 Other long term (current) drug therapy: Secondary | ICD-10-CM | POA: Diagnosis not present

## 2014-10-14 DIAGNOSIS — M79606 Pain in leg, unspecified: Secondary | ICD-10-CM

## 2014-10-14 DIAGNOSIS — F329 Major depressive disorder, single episode, unspecified: Secondary | ICD-10-CM | POA: Insufficient documentation

## 2014-10-14 DIAGNOSIS — Z8701 Personal history of pneumonia (recurrent): Secondary | ICD-10-CM | POA: Insufficient documentation

## 2014-10-14 DIAGNOSIS — M79605 Pain in left leg: Secondary | ICD-10-CM | POA: Diagnosis present

## 2014-10-14 DIAGNOSIS — Z8614 Personal history of Methicillin resistant Staphylococcus aureus infection: Secondary | ICD-10-CM | POA: Insufficient documentation

## 2014-10-14 DIAGNOSIS — F419 Anxiety disorder, unspecified: Secondary | ICD-10-CM | POA: Diagnosis not present

## 2014-10-14 DIAGNOSIS — R509 Fever, unspecified: Secondary | ICD-10-CM | POA: Insufficient documentation

## 2014-10-14 LAB — URINALYSIS, ROUTINE W REFLEX MICROSCOPIC
BILIRUBIN URINE: NEGATIVE
Glucose, UA: NEGATIVE mg/dL
Hgb urine dipstick: NEGATIVE
Ketones, ur: NEGATIVE mg/dL
LEUKOCYTES UA: NEGATIVE
Nitrite: NEGATIVE
PH: 6 (ref 5.0–8.0)
Protein, ur: NEGATIVE mg/dL
SPECIFIC GRAVITY, URINE: 1.012 (ref 1.005–1.030)
Urobilinogen, UA: 0.2 mg/dL (ref 0.0–1.0)

## 2014-10-14 LAB — BASIC METABOLIC PANEL
Anion gap: 12 (ref 5–15)
BUN: 13 mg/dL (ref 6–23)
CO2: 24 mEq/L (ref 19–32)
Calcium: 9.6 mg/dL (ref 8.4–10.5)
Chloride: 97 mEq/L (ref 96–112)
Creatinine, Ser: 0.55 mg/dL (ref 0.50–1.10)
GFR calc Af Amer: >90 mL/min (ref >90)
GLUCOSE: 88 mg/dL (ref 70–99)
Potassium: 4 mEq/L (ref 3.7–5.3)
Sodium: 133 mEq/L — ABNORMAL LOW (ref 137–147)

## 2014-10-14 LAB — CBC WITH DIFFERENTIAL/PLATELET
Basophils Absolute: 0.1 10*3/uL (ref 0.0–0.1)
Basophils Relative: 0 % (ref 0–1)
EOS ABS: 0.3 10*3/uL (ref 0.0–0.7)
Eosinophils Relative: 2 % (ref 0–5)
HEMATOCRIT: 38.3 % (ref 36.0–46.0)
HEMOGLOBIN: 11.5 g/dL — AB (ref 12.0–15.0)
LYMPHS ABS: 2.1 10*3/uL (ref 0.7–4.0)
Lymphocytes Relative: 15 % (ref 12–46)
MCH: 24.5 pg — AB (ref 26.0–34.0)
MCHC: 30 g/dL (ref 30.0–36.0)
MCV: 81.7 fL (ref 78.0–100.0)
MONO ABS: 1.3 10*3/uL — AB (ref 0.1–1.0)
MONOS PCT: 10 % (ref 3–12)
NEUTROS PCT: 73 % (ref 43–77)
Neutro Abs: 10 10*3/uL — ABNORMAL HIGH (ref 1.7–7.7)
Platelets: 479 10*3/uL — ABNORMAL HIGH (ref 150–400)
RBC: 4.69 MIL/uL (ref 3.87–5.11)
RDW: 15 % (ref 11.5–15.5)
WBC: 13.8 10*3/uL — ABNORMAL HIGH (ref 4.0–10.5)

## 2014-10-14 LAB — SEDIMENTATION RATE: Sed Rate: 30 mm/hr — ABNORMAL HIGH (ref 0–22)

## 2014-10-14 LAB — I-STAT CG4 LACTIC ACID, ED: Lactic Acid, Venous: 1.17 mmol/L (ref 0.5–2.2)

## 2014-10-14 MED ORDER — HYDROMORPHONE HCL 4 MG PO TABS: 4.0000 mg | ORAL_TABLET | Freq: Four times a day (QID) | ORAL | Status: AC | PRN

## 2014-10-14 MED ORDER — DIPHENHYDRAMINE HCL 50 MG/ML IJ SOLN
25.0000 mg | Freq: Once | INTRAMUSCULAR | Status: AC
  Administered 2014-10-14: 25 mg via INTRAVENOUS
  Filled 2014-10-14: qty 1

## 2014-10-14 MED ORDER — CEPHALEXIN 500 MG PO CAPS: 500.0000 mg | ORAL_CAPSULE | Freq: Four times a day (QID) | ORAL | Status: AC

## 2014-10-14 MED ORDER — HYDROMORPHONE HCL 1 MG/ML IJ SOLN
1.0000 mg | Freq: Once | INTRAMUSCULAR | Status: AC
  Administered 2014-10-14: 1 mg via INTRAVENOUS
  Filled 2014-10-14: qty 1

## 2014-10-14 NOTE — Discharge Instructions (Signed)
Total Hip Replacement Total hip replacement is the replacement of your damaged hip with an artificial hip joint (prosthetic hip joint). The purpose of this surgery is to reduce pain and improve your hip function. LET YOUR HEALTH CARE PROVIDER KNOW ABOUT:   Any allergies you have.  All medicines you are taking, including vitamins, herbs, eye drops, creams, and over-the-counter medicines.  Previous problems you or members of your family have had with the use of anesthetics.  Any blood disorders you have.  Previous surgeries you have had.  Medical conditions you have. RISKS AND COMPLICATIONS Generally, total hip replacement is a safe procedure. However, problems can occur and include:  Infection.  Dislocation (the ball of the hip-joint prosthesis comes out of contact with the socket).  Loosening of the stem connected to the ball or socket.  Fracture of the bone while inserting the prosthesis.  Formation of blood clots, which can break loose and travel to and injure your lungs (pulmonary embolus). BEFORE THE PROCEDURE   Do not eat or drink anything after midnight on the night before the procedure or as directed by your health care provider.  Ask your health care provider about changing or stopping your regular medicines. This is especially important if you are taking diabetes medicines or blood thinners. PROCEDURE  Just before the procedure, you will receive medicine that makes you drowsy (sedative) or a medicine that makes you fall asleep (general anesthetic). This will be given through a tube that is inserted into one of your veins (IV tube).  You will then receive a medicine injected into your spine that numbs your body below the waist (spinal anesthetic).  An incision is made in your hip. Your surgeon will take out any damaged cartilage and bone.  Next, your surgeon will insert a prosthetic socket into your pelvic bone. This is usually secured with screws.  Your surgeon will  then cut off the ball of your thigh bone (femur) and attach a prosthetic ball on a stem to your femur.  The surgeon will place the ball into the socket and check the range of motion of your new hip. AFTER THE PROCEDURE   You will be taken to a recovery area where a nurse will watch and check your progress.  Once you are awake and stable, you will be taken to a hospital room.  You will receive physical therapy until you are doing well and your health care provider feels it is safe for you to go home. Document Released: 03/17/2001 Document Revised: 04/25/2014 Document Reviewed: 02/09/2014 [employer] Patient Information 2015 [employer]. This information is not intended to replace advice given to you by your health care provider. Make sure you discuss any questions you have with your health care provider.  

## 2014-10-14 NOTE — ED Notes (Signed)
Pt escorted to discharge window. Verbalized understanding discharge instructions. In no acute distress.   

## 2014-10-14 NOTE — ED Provider Notes (Signed)
Medical screening examination/treatment/procedure(s) were conducted as a shared visit with non-physician practitioner(s) and myself.  I personally evaluated the patient during the encounter.   EKG Interpretation None      Pt presenting w/ inc leg pain and report of a fever for 100.8 yesterday. She's a complicated surgical history with multiple revisions of the left hip. She currently has a drain placed for recurrent seroma. On physical exam she is afebrile and in no acute distress. The surgical site is clean dry and intact. Drainage from JP drain is serous sanguinous. She has normal sensation distally. She does have pain range of motion in the hip, palpation of her femur and over the knee. Plain films show no acute findings. Spoke with worth of feels comfortable increasing Keflex 4 times a day and using a higher dose of pain medication with outpatient followup with her surgeon on Monday. Given the she does not appear acutely ill I feel that this is acceptable. Return precautions given for new or worsening symptoms including worsening pain, continued fevers.   Toy CookeyMegan Docherty, MD 10/14/14 [phone]
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
## 2014-10-14 NOTE — ED Notes (Signed)
Bed: WA04 Expected date:  Expected time:  Means of arrival:  Comments: 

## 2014-10-14 NOTE — ED Notes (Signed)
Per pt, states femur surgery on the 13th-had plate removed from previous surgery in August-has draining serosanguinous fluid

## 2016-07-24 IMAGING — CR DG FEMUR 2V*L*
6 series · 6 of 6 positions shown · non-contrast
Comparison: 01/28/2014

CLINICAL DATA: Left leg pain, preoperative evaluation for
replacement

EXAM:
LEFT FEMUR - 2 VIEW

[t hip ap left (1 of 3)]
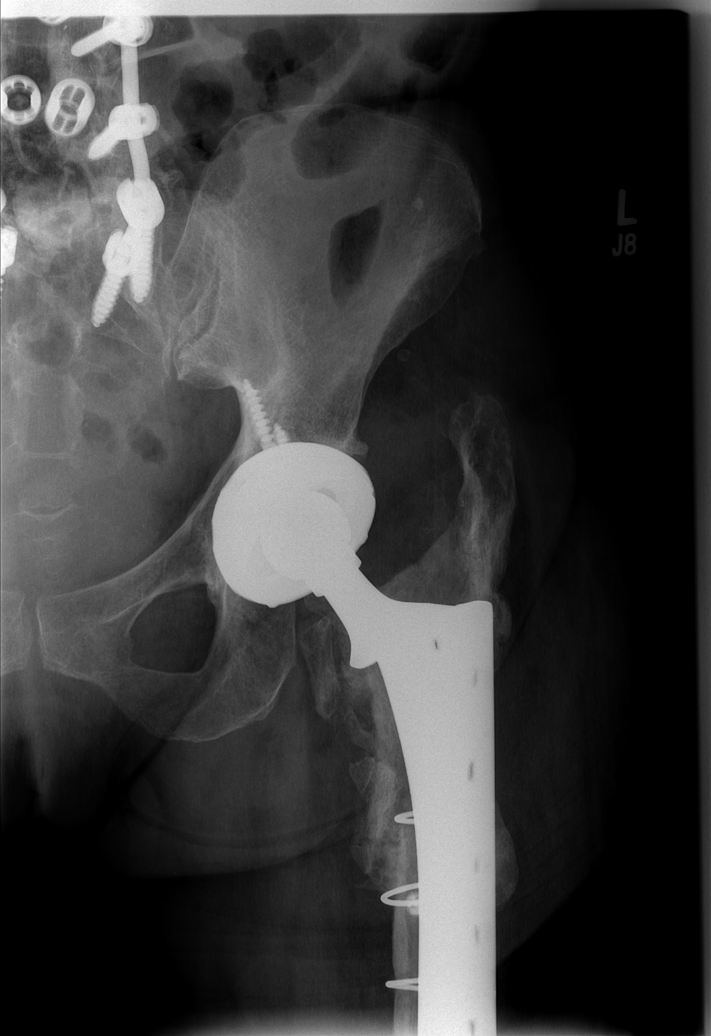

[t hip frog leg left (1 of 3)]
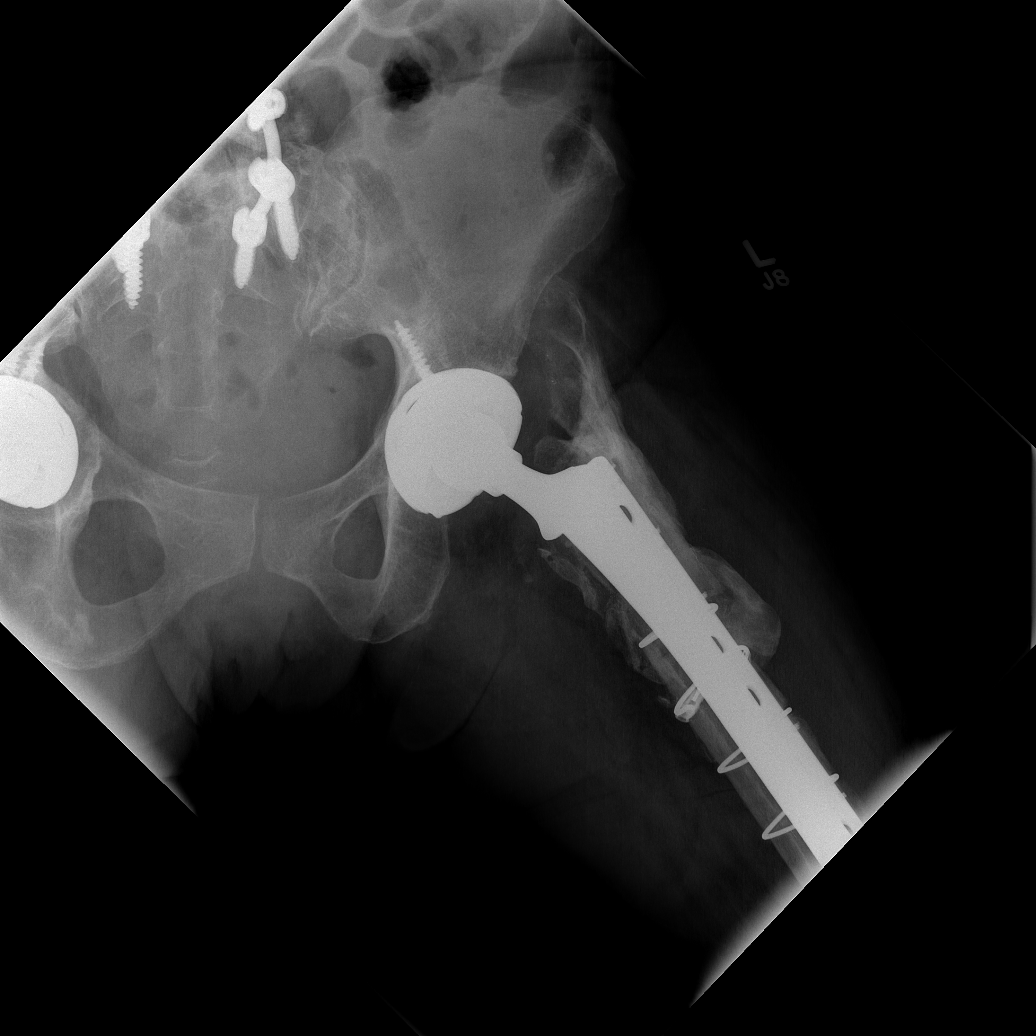

[t hip frog leg left (2 of 3)]
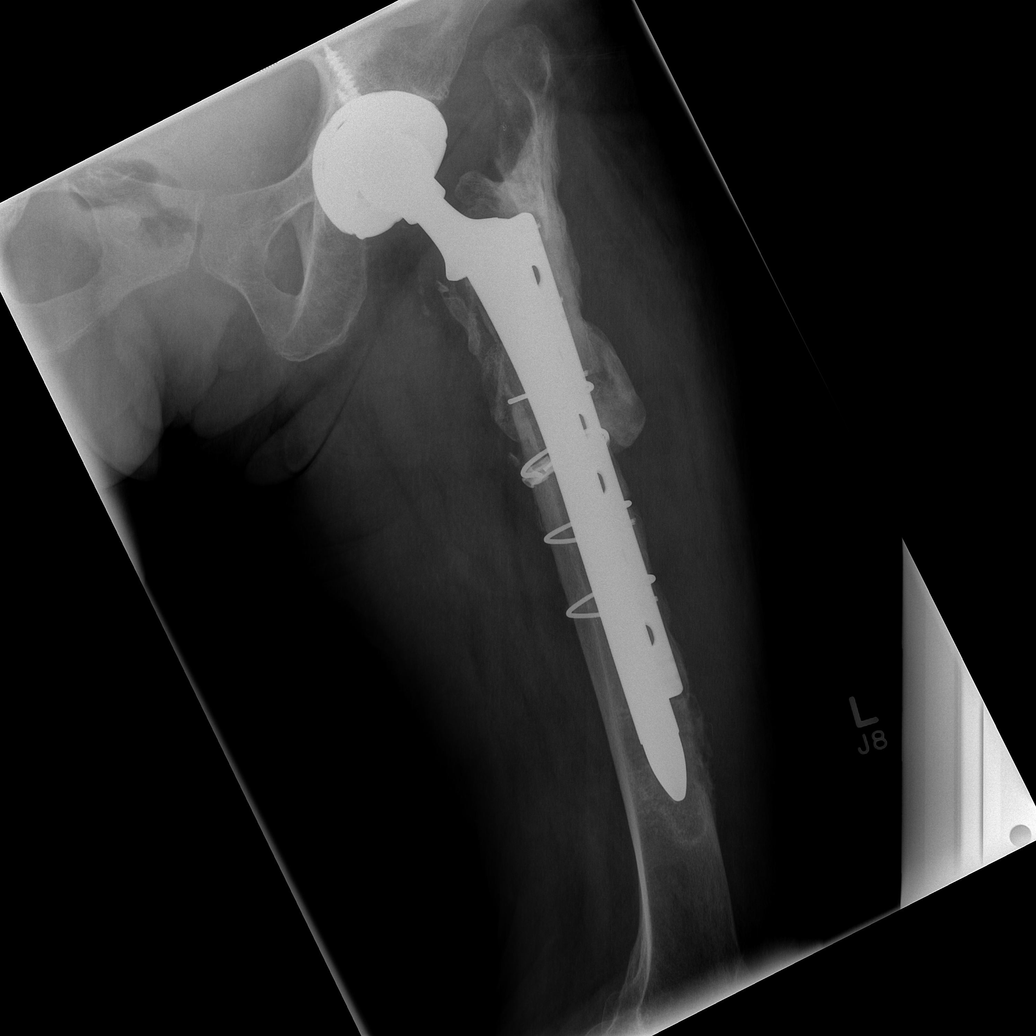

[t hip ap left (2 of 3)]
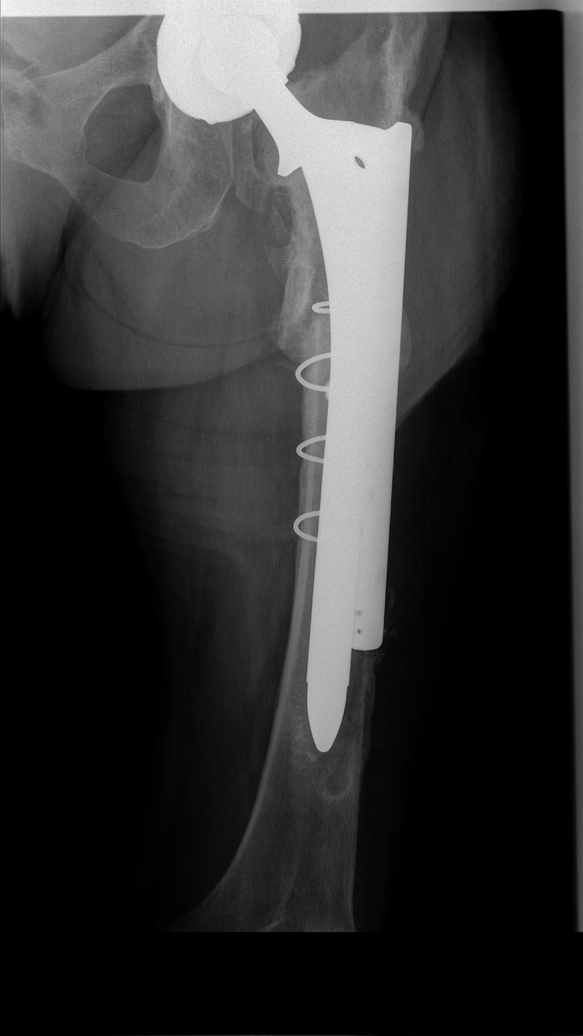

[t hip ap left (3 of 3)]
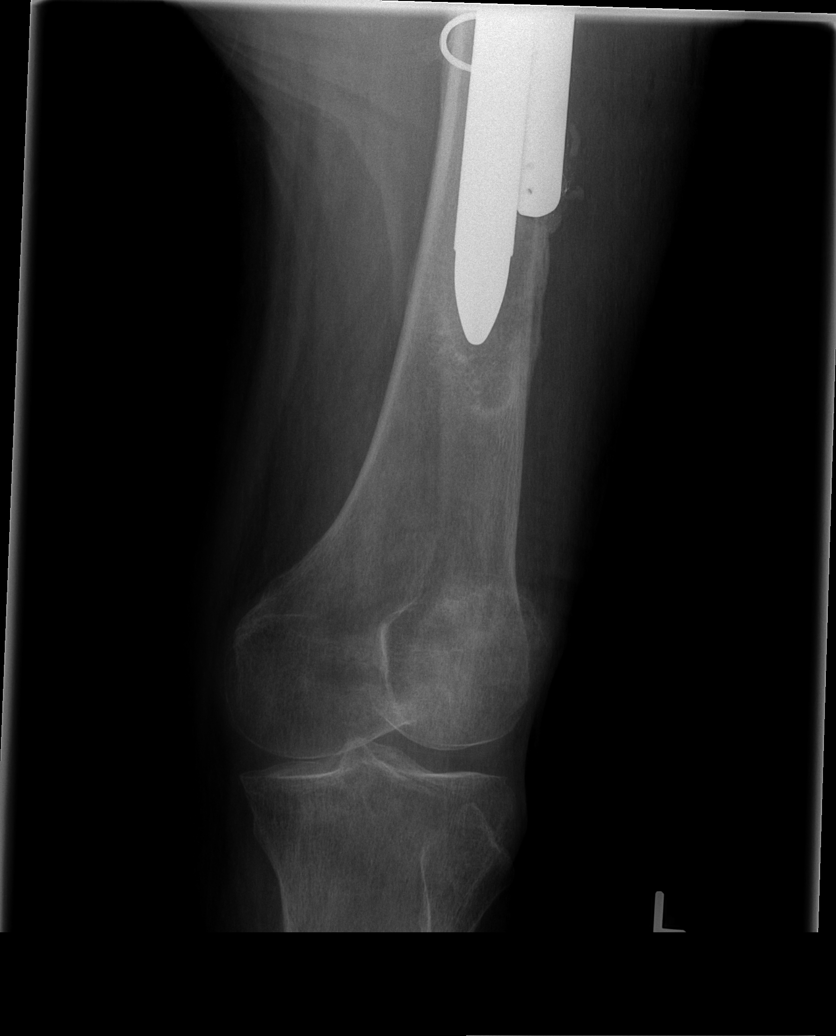

[t hip frog leg left (3 of 3)]
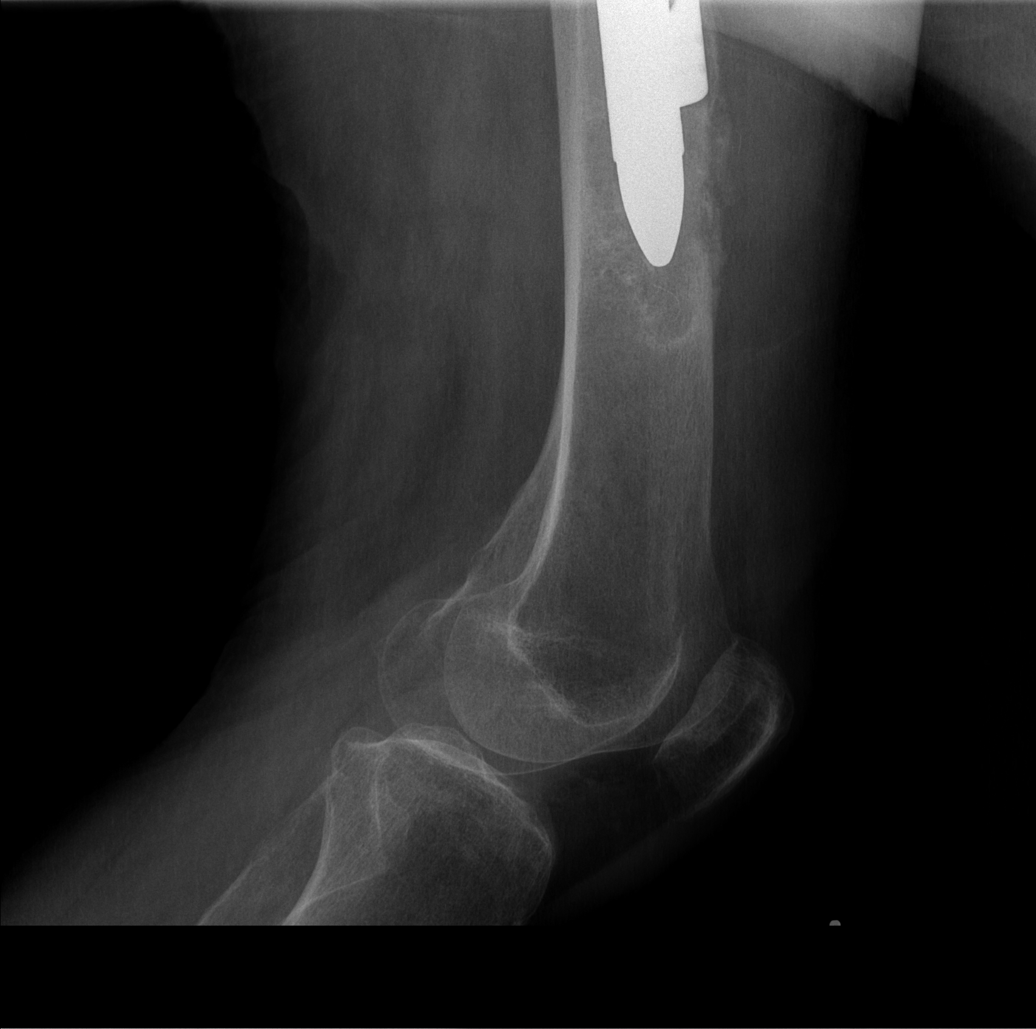

[6 of 6 positions shown; findings below may reference images not displayed]

FINDINGS: Left hip prosthesis is noted. No definitive hardware failure is
noted however the fixation sideplate appears more superiorly
oriented with respect to the femoral component of the prosthesis
when compared with the prior postoperative film.
IMPRESSION: Shift of this fixation sideplate with respect to the femoral
component of the prosthesis when compared with the prior
postoperative film. No other focal abnormality is noted.

## 2016-07-24 IMAGING — CR DG PELVIS 1-2V
1 series · 1 of 1 positions shown · non-contrast
Comparison: January 28, 2014

CLINICAL DATA: Left lower extremity pain; failed arthroplasty

EXAM:
PELVIS - 1-2 VIEW

[t pelvis a.p.]
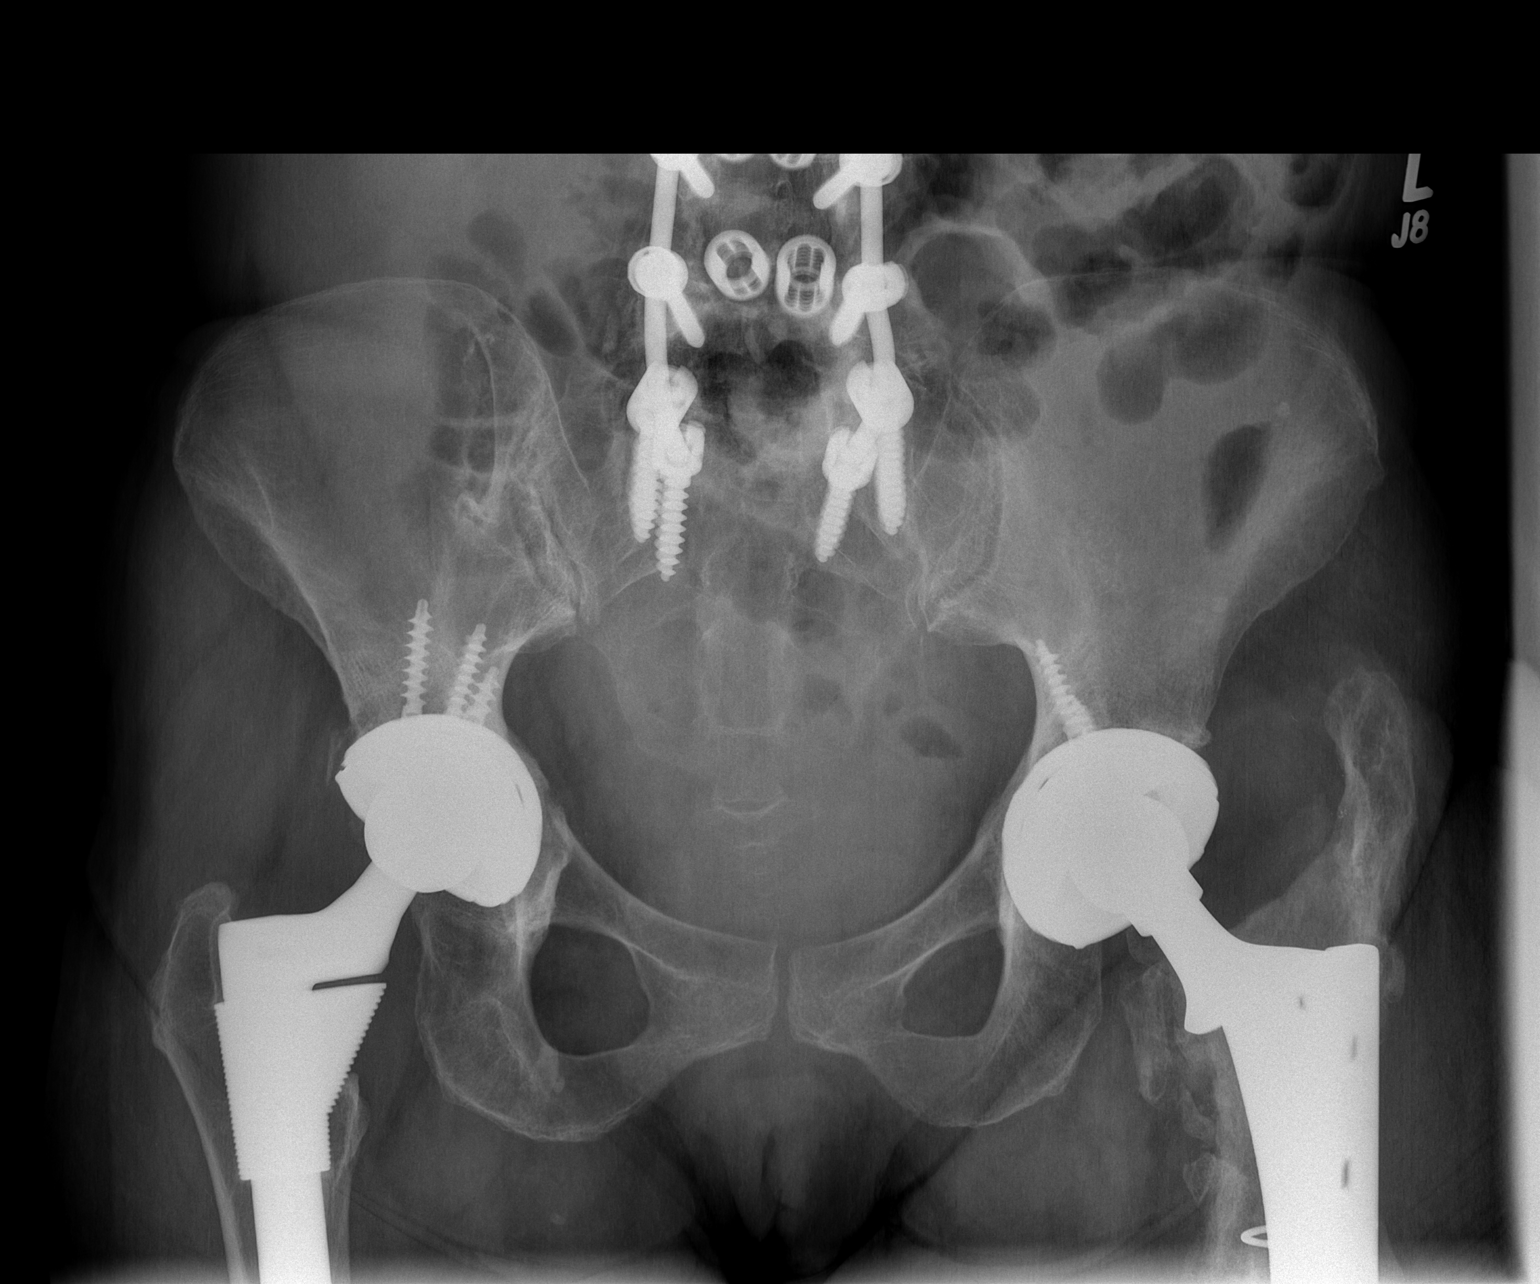

[1 of 1 positions shown; findings below may reference images not displayed]

FINDINGS: Total hip prostheses are noted bilaterally, not appreciably changed
on this single view compared to prior study. No acute fracture or
dislocation. There is extensive bony overgrowth on the left,
particularly laterally consistent with postoperative/posttraumatic
bony remodeling. There is extensive postoperative change in the
lumbar spine.
IMPRESSION: Extensive bony overgrowth lateral to the greater trochanter on the
left. There is bony remodeling in the area of comminuted proximal
left femoral fracture. Prosthetic components bilaterally appear
stable compared to prior study and appear well seated on this single
view.
